# Patient Record
Sex: Female | Born: 1965 | Race: Black or African American | Hispanic: No | Marital: Single | Smoking: Never smoker
Health system: Southern US, Community
[De-identification: ages and names within clinical notes are randomized; demographics above are authoritative.]

## PROBLEM LIST (undated history)

## (undated) DIAGNOSIS — F329 Major depressive disorder, single episode, unspecified: Secondary | ICD-10-CM

## (undated) DIAGNOSIS — E119 Type 2 diabetes mellitus without complications: Secondary | ICD-10-CM

## (undated) DIAGNOSIS — G4730 Sleep apnea, unspecified: Secondary | ICD-10-CM

## (undated) DIAGNOSIS — M79606 Pain in leg, unspecified: Secondary | ICD-10-CM

## (undated) DIAGNOSIS — F419 Anxiety disorder, unspecified: Secondary | ICD-10-CM

## (undated) DIAGNOSIS — E785 Hyperlipidemia, unspecified: Secondary | ICD-10-CM

## (undated) DIAGNOSIS — I10 Essential (primary) hypertension: Secondary | ICD-10-CM

## (undated) DIAGNOSIS — F32A Depression, unspecified: Secondary | ICD-10-CM

## (undated) DIAGNOSIS — J45909 Unspecified asthma, uncomplicated: Secondary | ICD-10-CM

## (undated) DIAGNOSIS — N289 Disorder of kidney and ureter, unspecified: Secondary | ICD-10-CM

## (undated) HISTORY — DX: Essential (primary) hypertension: I10

## (undated) HISTORY — DX: Type 2 diabetes mellitus without complications: E11.9

## (undated) HISTORY — PX: GASTRIC BYPASS: SHX52

## (undated) HISTORY — PX: LAPAROSCOPIC CHOLECYSTECTOMY: SUR755

## (undated) HISTORY — PX: CARDIAC SURGERY: SHX584

## (undated) HISTORY — DX: Unspecified asthma, uncomplicated: J45.909

## (undated) HISTORY — DX: Major depressive disorder, single episode, unspecified: F32.9

## (undated) HISTORY — DX: Hyperlipidemia, unspecified: E78.5

## (undated) HISTORY — DX: Pain in leg, unspecified: M79.606

## (undated) HISTORY — DX: Sleep apnea, unspecified: G47.30

## (undated) HISTORY — DX: Morbid (severe) obesity due to excess calories: E66.01

## (undated) HISTORY — DX: Depression, unspecified: F32.A

---

## 2000-10-30 ENCOUNTER — Emergency Department (HOSPITAL_COMMUNITY): Admission: EM | Admit: 2000-10-30 | Discharge: 2000-10-30 | Payer: Self-pay | Admitting: Emergency Medicine

## 2003-02-23 ENCOUNTER — Other Ambulatory Visit: Payer: Self-pay

## 2003-06-28 ENCOUNTER — Other Ambulatory Visit: Payer: Self-pay

## 2004-06-09 ENCOUNTER — Emergency Department: Payer: Self-pay | Admitting: Emergency Medicine

## 2004-08-26 ENCOUNTER — Emergency Department: Payer: Self-pay | Admitting: Emergency Medicine

## 2004-09-01 ENCOUNTER — Ambulatory Visit: Payer: Self-pay | Admitting: Emergency Medicine

## 2004-11-12 ENCOUNTER — Ambulatory Visit: Payer: Self-pay | Admitting: Nurse Practitioner

## 2004-11-18 ENCOUNTER — Ambulatory Visit: Payer: Self-pay | Admitting: Nurse Practitioner

## 2005-05-01 ENCOUNTER — Emergency Department: Payer: Self-pay | Admitting: Emergency Medicine

## 2005-05-09 ENCOUNTER — Emergency Department: Payer: Self-pay | Admitting: Emergency Medicine

## 2005-09-28 ENCOUNTER — Emergency Department: Payer: Self-pay | Admitting: Internal Medicine

## 2005-09-28 ENCOUNTER — Other Ambulatory Visit: Payer: Self-pay

## 2006-01-31 ENCOUNTER — Emergency Department: Payer: Self-pay

## 2006-11-02 ENCOUNTER — Ambulatory Visit: Payer: Self-pay | Admitting: Nurse Practitioner

## 2006-11-22 ENCOUNTER — Ambulatory Visit: Payer: Self-pay | Admitting: Nurse Practitioner

## 2007-03-01 ENCOUNTER — Other Ambulatory Visit: Payer: Self-pay

## 2007-03-01 ENCOUNTER — Inpatient Hospital Stay: Payer: Self-pay | Admitting: Internal Medicine

## 2007-03-11 ENCOUNTER — Other Ambulatory Visit: Payer: Self-pay

## 2007-03-11 ENCOUNTER — Observation Stay: Payer: Self-pay | Admitting: Internal Medicine

## 2007-03-12 ENCOUNTER — Other Ambulatory Visit: Payer: Self-pay

## 2007-12-21 ENCOUNTER — Observation Stay: Payer: Self-pay | Admitting: Internal Medicine

## 2007-12-21 ENCOUNTER — Other Ambulatory Visit: Payer: Self-pay

## 2008-02-07 ENCOUNTER — Ambulatory Visit: Payer: Self-pay | Admitting: Family Medicine

## 2008-02-19 ENCOUNTER — Emergency Department: Payer: Self-pay | Admitting: Emergency Medicine

## 2008-11-15 ENCOUNTER — Emergency Department: Payer: Self-pay | Admitting: Emergency Medicine

## 2009-01-21 ENCOUNTER — Ambulatory Visit: Payer: Self-pay | Admitting: Family Medicine

## 2009-02-02 ENCOUNTER — Ambulatory Visit: Payer: Self-pay | Admitting: Family Medicine

## 2009-07-13 ENCOUNTER — Ambulatory Visit: Payer: Self-pay | Admitting: Internal Medicine

## 2009-07-13 ENCOUNTER — Inpatient Hospital Stay: Payer: Self-pay | Admitting: Internal Medicine

## 2009-07-14 ENCOUNTER — Encounter: Payer: Self-pay | Admitting: Cardiovascular Disease

## 2009-07-15 ENCOUNTER — Ambulatory Visit: Payer: Self-pay | Admitting: Cardiology

## 2010-05-04 NOTE — Consult Note (Signed)
[facility]   Imported By: Harlon Flor 07/16/2009 12:25:35  _____________________________________________________________________  External Attachment:    Type:   Image     Comment:   External Document

## 2011-04-28 ENCOUNTER — Ambulatory Visit: Payer: Self-pay | Admitting: Specialist

## 2011-04-28 LAB — COMPREHENSIVE METABOLIC PANEL
Albumin: 3.8 g/dL (ref 3.4–5.0)
Alkaline Phosphatase: 51 U/L (ref 50–136)
BUN: 9 mg/dL (ref 7–18)
Bilirubin,Total: 0.5 mg/dL (ref 0.2–1.0)
Calcium, Total: 9.1 mg/dL (ref 8.5–10.1)
Chloride: 103 mmol/L (ref 98–107)
Co2: 29 mmol/L (ref 21–32)
Creatinine: 0.91 mg/dL (ref 0.60–1.30)
Osmolality: 285 (ref 275–301)
SGOT(AST): 22 U/L (ref 15–37)
SGPT (ALT): 27 U/L
Sodium: 143 mmol/L (ref 136–145)

## 2011-04-28 LAB — PROTIME-INR: Prothrombin Time: 12.2 secs (ref 11.5–14.7)

## 2011-04-28 LAB — CBC WITH DIFFERENTIAL/PLATELET
Eosinophil %: 0.9 %
HGB: 13.4 g/dL (ref 12.0–16.0)
Lymphocyte %: 13.5 %
Monocyte #: 0.5 10*3/uL (ref 0.0–0.7)
Monocyte %: 4.5 %
Neutrophil #: 9.7 10*3/uL — ABNORMAL HIGH (ref 1.4–6.5)
Neutrophil %: 80.6 %
Platelet: 307 10*3/uL (ref 150–440)
RBC: 4.62 10*6/uL (ref 3.80–5.20)
WBC: 12 10*3/uL — ABNORMAL HIGH (ref 3.6–11.0)

## 2011-04-28 LAB — BILIRUBIN, DIRECT: Bilirubin, Direct: 0.1 mg/dL (ref 0.00–0.20)

## 2011-04-28 LAB — TSH: Thyroid Stimulating Horm: 4.84 u[IU]/mL — ABNORMAL HIGH

## 2011-04-28 LAB — FERRITIN: Ferritin (ARMC): 41 ng/mL (ref 8–388)

## 2011-04-28 LAB — AMYLASE: Amylase: 35 U/L (ref 25–115)

## 2011-04-28 LAB — PHOSPHORUS: Phosphorus: 3.9 mg/dL (ref 2.5–4.9)

## 2011-04-28 LAB — APTT: Activated PTT: 33.4 secs (ref 23.6–35.9)

## 2011-05-11 ENCOUNTER — Ambulatory Visit: Payer: Self-pay | Admitting: Specialist

## 2011-05-19 ENCOUNTER — Ambulatory Visit: Payer: Self-pay | Admitting: Cardiovascular Disease

## 2011-06-03 ENCOUNTER — Ambulatory Visit: Payer: Self-pay | Admitting: Specialist

## 2011-06-30 ENCOUNTER — Ambulatory Visit: Payer: Self-pay | Admitting: Gastroenterology

## 2011-07-08 ENCOUNTER — Ambulatory Visit: Payer: Self-pay | Admitting: Specialist

## 2011-08-02 ENCOUNTER — Other Ambulatory Visit: Payer: Self-pay | Admitting: Specialist

## 2011-08-02 LAB — CBC WITH DIFFERENTIAL/PLATELET
Basophil #: 0.1 10*3/uL (ref 0.0–0.1)
Basophil %: 0.6 %
Eosinophil %: 1.9 %
HCT: 38.3 % (ref 35.0–47.0)
HGB: 11.7 g/dL — ABNORMAL LOW (ref 12.0–16.0)
Lymphocyte #: 1.9 10*3/uL (ref 1.0–3.6)
Lymphocyte %: 14 %
MCH: 27.4 pg (ref 26.0–34.0)
MCV: 90 fL (ref 80–100)
Monocyte #: 0.6 x10 3/mm (ref 0.2–0.9)
Monocyte %: 4.6 %
Neutrophil %: 78.9 %

## 2011-08-02 LAB — COMPREHENSIVE METABOLIC PANEL
Albumin: 3.3 g/dL — ABNORMAL LOW (ref 3.4–5.0)
Anion Gap: 8 (ref 7–16)
BUN: 11 mg/dL (ref 7–18)
Calcium, Total: 8.8 mg/dL (ref 8.5–10.1)
Chloride: 102 mmol/L (ref 98–107)
Co2: 28 mmol/L (ref 21–32)
Creatinine: 1.1 mg/dL (ref 0.60–1.30)
EGFR (African American): >60
EGFR (Non-African Amer.): >60
Glucose: 239 mg/dL — ABNORMAL HIGH (ref 65–99)
Osmolality: 283 (ref 275–301)
Potassium: 4 mmol/L (ref 3.5–5.1)
SGOT(AST): 17 U/L (ref 15–37)
SGPT (ALT): 22 U/L
Sodium: 138 mmol/L (ref 136–145)
Total Protein: 7.4 g/dL (ref 6.4–8.2)

## 2011-08-03 ENCOUNTER — Ambulatory Visit: Payer: Self-pay | Admitting: Specialist

## 2011-08-10 ENCOUNTER — Ambulatory Visit: Payer: Self-pay | Admitting: Specialist

## 2011-09-03 ENCOUNTER — Ambulatory Visit: Payer: Self-pay | Admitting: Specialist

## 2011-10-05 ENCOUNTER — Other Ambulatory Visit: Payer: Self-pay | Admitting: Specialist

## 2012-01-04 ENCOUNTER — Ambulatory Visit: Payer: Self-pay | Admitting: Specialist

## 2012-02-03 ENCOUNTER — Ambulatory Visit: Payer: Self-pay | Admitting: Specialist

## 2013-01-02 ENCOUNTER — Encounter: Payer: Self-pay | Admitting: Internal Medicine

## 2013-01-21 ENCOUNTER — Ambulatory Visit: Payer: Self-pay | Admitting: Family Medicine

## 2014-04-14 ENCOUNTER — Ambulatory Visit: Payer: Self-pay

## 2014-05-07 ENCOUNTER — Emergency Department: Payer: Self-pay | Admitting: Student

## 2014-05-08 ENCOUNTER — Emergency Department: Payer: Self-pay | Admitting: Emergency Medicine

## 2014-05-26 ENCOUNTER — Ambulatory Visit: Payer: Self-pay | Admitting: Cardiovascular Disease

## 2014-05-27 ENCOUNTER — Ambulatory Visit: Payer: Self-pay | Admitting: Pain Medicine

## 2014-05-30 ENCOUNTER — Encounter: Payer: Self-pay | Admitting: Cardiovascular Disease

## 2014-05-30 ENCOUNTER — Ambulatory Visit (INDEPENDENT_AMBULATORY_CARE_PROVIDER_SITE_OTHER): Payer: Medicare Other | Admitting: Cardiovascular Disease

## 2014-05-30 ENCOUNTER — Encounter: Payer: Self-pay | Admitting: *Deleted

## 2014-05-30 ENCOUNTER — Ambulatory Visit: Payer: Self-pay | Admitting: Cardiovascular Disease

## 2014-05-30 ENCOUNTER — Other Ambulatory Visit: Payer: Self-pay

## 2014-05-30 ENCOUNTER — Encounter (INDEPENDENT_AMBULATORY_CARE_PROVIDER_SITE_OTHER): Payer: Self-pay

## 2014-05-30 VITALS — BP 122/98 | HR 102 | Ht 67.0 in | Wt 366.5 lb

## 2014-05-30 DIAGNOSIS — R079 Chest pain, unspecified: Secondary | ICD-10-CM

## 2014-05-30 DIAGNOSIS — E785 Hyperlipidemia, unspecified: Secondary | ICD-10-CM | POA: Insufficient documentation

## 2014-05-30 DIAGNOSIS — I10 Essential (primary) hypertension: Secondary | ICD-10-CM | POA: Insufficient documentation

## 2014-05-30 NOTE — Progress Notes (Signed)
Primary care physician: Dr. Sherald BargeBecker-Dreps. [facility]  HPI  This is a 49 year old African-American female who was referred for evaluation of chest pain. She has chronic episodes of chest pain. She has known history of morbid obesity status post gastric bypass surgery in 2014. She lost 160 pounds since then. She also has known history of hypertension and type 2 diabetes which improved after weight loss. She is status post cholecystectomy. I have seen her in the past many years ago for chest pain. No ischemic workup could be done at that time due to morbid obesity. [facility]. The chest pain was left-sided and worse with certain movements. It was felt to be musculoskeletal. She takes a muscle relaxant with improvement in symptoms. However, in addition to that, she describes substernal chest tightness with shortness of breath which mainly happens with physical exertion. This happened today after walking from the parking lot to our office.  No Known Allergies   No current outpatient prescriptions on file prior to visit.   No current facility-administered medications on file prior to visit.     Past Medical History  Diagnosis Date  . Diabetes   . Sleep apnea   . Leg pain   . Asthma   . Depression   . Morbid obesity   . Essential hypertension   . Hyperlipidemia      Past Surgical History  Procedure Laterality Date  . Gastric bypass    . Laparoscopic cholecystectomy       Family History  Problem Relation Age of Onset  . Heart failure Mother      History   Social History  . Marital Status: Single    Spouse Name: N/A  . Number of Children: N/A  . Years of Education: N/A   Occupational History  . Not on file.   Social History Main Topics  . Smoking status: Never Smoker   . Smokeless tobacco: Not on file  . Alcohol Use: No  . Drug Use: No  . Sexual Activity: Not on file   Other Topics Concern  . Not  on file   Social History Narrative     ROS A 10 point review of system was performed. It is negative other than that mentioned in the history of present illness.   PHYSICAL EXAM   BP 122/98 mmHg  Pulse 102  Ht 5\' 7"  (1.702 m)  Wt 366 lb 8 oz (166.243 kg)  BMI 57.39 kg/m2  Constitutional: She is oriented to person, place, and time. She appears well-developed and well-nourished. No distress.  HENT: No nasal discharge.  Head: Normocephalic and atraumatic.  Eyes: Pupils are equal and round. No discharge.  Neck: Normal range of motion. Neck supple. No JVD present. No thyromegaly present.  Cardiovascular: Normal rate, regular rhythm, normal heart sounds. Exam reveals no gallop and no friction rub. No murmur heard.  Pulmonary/Chest: Effort normal and breath sounds normal. No stridor. No respiratory distress. She has no wheezes. She has no rales. She exhibits no tenderness.  Abdominal: Soft. Bowel sounds are normal. She exhibits no distension. There is no tenderness. There is no rebound and no guarding.  Musculoskeletal: Normal range of motion. She exhibits no edema and no tenderness.  Neurological: She is alert and oriented to person, place, and time. Coordination normal.  Skin: Skin is warm and dry. No rash noted. She is not diaphoretic. No erythema. No pallor.  Psychiatric: She has a  normal mood and affect. Her behavior is normal. Judgment and thought content normal.    ONG:EXBMW  Tachycardia  -consider old anterior infarct.   ABNORMAL     ASSESSMENT AND PLAN

## 2014-05-30 NOTE — Patient Instructions (Signed)
[facility] Cardiac Cath Instructions   You are scheduled for a Cardiac Cath on:___3/4/16______________________  Please arrive at _0630______am on the day of your procedure  You will need to pre-register prior to the day of your procedure.  [facility].  Registration is the first desk on your right.  Please take the procedure order we have given you in order to be registered appropriately  Do not eat/drink anything after midnight  Someone will need to drive you home  It is recommended someone be with you for the first 24 hours after your procedure  Wear clothes that are easy to get on/off and wear slip on shoes if possible   Medications bring a current list of all medications with you   _x__ Do not take these medications before your procedure: Hold Metformin a day before and two days after   Day of your procedure: [facility].  Free valet service is available.  After entering the Medical Mall please check-in at the registration desk (1st desk on your right) to receive your armband. After receiving your armband someone will escort you to the cardiac cath/special procedures waiting area.  The usual length of stay after your procedure is about 2 to 3 hours.  This can vary.  If you have any questions, please call our office at [phone], or you may call the cardiac cath lab at [facility] directly at [phone]   Your physician recommends that you return for lab work in:  Please take lab and chest x ray orders to [facility] medical mall entrance. To the Registration desk.  BMP INR CBC

## 2014-06-01 ENCOUNTER — Encounter: Payer: Self-pay | Admitting: Cardiovascular Disease

## 2014-06-01 DIAGNOSIS — R079 Chest pain, unspecified: Secondary | ICD-10-CM | POA: Insufficient documentation

## 2014-06-01 NOTE — Assessment & Plan Note (Signed)
Blood pressure is controlled on current medications. Given resting sinus tachycardia, a small dose beta blocker can be considered.

## 2014-06-01 NOTE — Assessment & Plan Note (Signed)
The patient clearly has a musculoskeletal component to her chest pain. However, she has a different kind of symptoms as well including substernal chest tightness with dyspnea. These mainly happening with physical exertion and currently with minimal activities. The symptoms are suggestive of class III angina. She has multiple risk factors for coronary artery disease. Unfortunately, stress testing would have somewhat of reduced diagnostic accuracy due to her weight and high chance of artifacts. I believe the best option is probably cardiac catheterization via the radial artery in order to minimize risk of bleeding. I discussed risks and benefits as well as alternatives. I scheduled the procedure for next week.

## 2014-06-05 ENCOUNTER — Telehealth: Payer: Self-pay | Admitting: *Deleted

## 2014-06-05 NOTE — Telephone Encounter (Signed)
Cath orders faxed to specials  Angie confirmed receipt

## 2014-06-06 ENCOUNTER — Ambulatory Visit: Payer: Self-pay | Admitting: Cardiovascular Disease

## 2014-06-06 DIAGNOSIS — R079 Chest pain, unspecified: Secondary | ICD-10-CM

## 2014-06-09 ENCOUNTER — Ambulatory Visit: Payer: Self-pay | Admitting: Pain Medicine

## 2014-06-10 ENCOUNTER — Ambulatory Visit: Payer: Self-pay | Admitting: Pain Medicine

## 2014-06-11 ENCOUNTER — Other Ambulatory Visit: Payer: Self-pay | Admitting: Neurosurgery

## 2014-06-13 ENCOUNTER — Encounter (HOSPITAL_COMMUNITY): Payer: Self-pay | Admitting: *Deleted

## 2014-06-13 NOTE — Progress Notes (Signed)
Patient had a cardiac cath this year, states it was normal.  I faxed a request to [facility] for results.

## 2014-06-15 MED ORDER — DEXTROSE 5 % IV SOLN
3.0000 g | INTRAVENOUS | Status: AC
  Administered 2014-06-16: 3 g via INTRAVENOUS
  Filled 2014-06-15 (×2): qty 3000

## 2014-06-15 MED ORDER — DEXAMETHASONE SODIUM PHOSPHATE 10 MG/ML IJ SOLN
10.0000 mg | INTRAMUSCULAR | Status: AC
  Administered 2014-06-16: 10 mg via INTRAVENOUS
  Filled 2014-06-15: qty 1

## 2014-06-16 ENCOUNTER — Encounter (HOSPITAL_COMMUNITY): Payer: Self-pay | Admitting: Certified Registered"

## 2014-06-16 ENCOUNTER — Inpatient Hospital Stay (HOSPITAL_COMMUNITY): Payer: Medicare Other

## 2014-06-16 ENCOUNTER — Inpatient Hospital Stay (HOSPITAL_COMMUNITY): Payer: Medicare Other | Admitting: Certified Registered"

## 2014-06-16 ENCOUNTER — Encounter (HOSPITAL_COMMUNITY)
Admission: RE | Disposition: A | Discharge status: Home / Self Care | Payer: Self-pay | Source: Ambulatory Visit | Attending: Neurosurgery

## 2014-06-16 ENCOUNTER — Inpatient Hospital Stay (HOSPITAL_COMMUNITY)
Admission: RE | Admit: 2014-06-16 | Discharge: 2014-06-17 | DRG: 982 | Disposition: A | Discharge status: Home / Self Care | Payer: Medicare Other | Source: Ambulatory Visit | Attending: Neurosurgery | Admitting: Neurosurgery

## 2014-06-16 DIAGNOSIS — Z6841 Body Mass Index (BMI) 40.0 and over, adult: Secondary | ICD-10-CM

## 2014-06-16 DIAGNOSIS — Z9884 Bariatric surgery status: Secondary | ICD-10-CM

## 2014-06-16 DIAGNOSIS — E785 Hyperlipidemia, unspecified: Secondary | ICD-10-CM | POA: Diagnosis present

## 2014-06-16 DIAGNOSIS — M542 Cervicalgia: Secondary | ICD-10-CM | POA: Diagnosis present

## 2014-06-16 DIAGNOSIS — J45909 Unspecified asthma, uncomplicated: Secondary | ICD-10-CM | POA: Diagnosis present

## 2014-06-16 DIAGNOSIS — E119 Type 2 diabetes mellitus without complications: Secondary | ICD-10-CM | POA: Diagnosis present

## 2014-06-16 DIAGNOSIS — F419 Anxiety disorder, unspecified: Secondary | ICD-10-CM | POA: Diagnosis present

## 2014-06-16 DIAGNOSIS — F329 Major depressive disorder, single episode, unspecified: Secondary | ICD-10-CM | POA: Diagnosis present

## 2014-06-16 DIAGNOSIS — M5000 Cervical disc disorder with myelopathy, unspecified cervical region: Secondary | ICD-10-CM | POA: Diagnosis present

## 2014-06-16 DIAGNOSIS — I10 Essential (primary) hypertension: Secondary | ICD-10-CM | POA: Diagnosis present

## 2014-06-16 DIAGNOSIS — G4730 Sleep apnea, unspecified: Secondary | ICD-10-CM | POA: Diagnosis present

## 2014-06-16 DIAGNOSIS — M5002 Cervical disc disorder with myelopathy, mid-cervical region: Secondary | ICD-10-CM | POA: Diagnosis present

## 2014-06-16 DIAGNOSIS — Z419 Encounter for procedure for purposes other than remedying health state, unspecified: Secondary | ICD-10-CM

## 2014-06-16 DIAGNOSIS — M5020 Other cervical disc displacement, unspecified cervical region: Secondary | ICD-10-CM | POA: Diagnosis present

## 2014-06-16 HISTORY — DX: Anxiety disorder, unspecified: F41.9

## 2014-06-16 HISTORY — PX: ANTERIOR CERVICAL DECOMP/DISCECTOMY FUSION: SHX1161

## 2014-06-16 LAB — CBC WITH DIFFERENTIAL/PLATELET
BASOS ABS: 0 10*3/uL (ref 0.0–0.1)
Basophils Relative: 0 % (ref 0–1)
Eosinophils Absolute: 0.2 10*3/uL (ref 0.0–0.7)
Eosinophils Relative: 2 % (ref 0–5)
HCT: 37.8 % (ref 36.0–46.0)
Hemoglobin: 12.2 g/dL (ref 12.0–15.0)
LYMPHS PCT: 15 % (ref 12–46)
Lymphs Abs: 1.9 10*3/uL (ref 0.7–4.0)
MCH: 28.6 pg (ref 26.0–34.0)
MCHC: 32.3 g/dL (ref 30.0–36.0)
MCV: 88.7 fL (ref 78.0–100.0)
MONO ABS: 0.8 10*3/uL (ref 0.1–1.0)
Monocytes Relative: 7 % (ref 3–12)
NEUTROS ABS: 9.7 10*3/uL — AB (ref 1.7–7.7)
NEUTROS PCT: 76 % (ref 43–77)
PLATELETS: 360 10*3/uL (ref 150–400)
RBC: 4.26 MIL/uL (ref 3.87–5.11)
RDW: 13.4 % (ref 11.5–15.5)
WBC: 12.7 10*3/uL — AB (ref 4.0–10.5)

## 2014-06-16 LAB — GLUCOSE, CAPILLARY
GLUCOSE-CAPILLARY: 153 mg/dL — AB (ref 70–99)
GLUCOSE-CAPILLARY: 274 mg/dL — AB (ref 70–99)
Glucose-Capillary: 121 mg/dL — ABNORMAL HIGH (ref 70–99)
Glucose-Capillary: 157 mg/dL — ABNORMAL HIGH (ref 70–99)

## 2014-06-16 LAB — BASIC METABOLIC PANEL
ANION GAP: 8 (ref 5–15)
BUN: 12 mg/dL (ref 6–23)
CALCIUM: 8.8 mg/dL (ref 8.4–10.5)
CHLORIDE: 103 mmol/L (ref 96–112)
CO2: 26 mmol/L (ref 19–32)
Creatinine, Ser: 0.9 mg/dL (ref 0.50–1.10)
GFR calc Af Amer: 88 mL/min — ABNORMAL LOW (ref >90)
GFR calc non Af Amer: 76 mL/min — ABNORMAL LOW (ref >90)
Glucose, Bld: 121 mg/dL — ABNORMAL HIGH (ref 70–99)
POTASSIUM: 4.3 mmol/L (ref 3.5–5.1)
SODIUM: 137 mmol/L (ref 135–145)

## 2014-06-16 LAB — HCG, SERUM, QUALITATIVE: Preg, Serum: NEGATIVE

## 2014-06-16 LAB — SURGICAL PCR SCREEN
MRSA, PCR: NEGATIVE
Staphylococcus aureus: POSITIVE — AB

## 2014-06-16 SURGERY — ANTERIOR CERVICAL DECOMPRESSION/DISCECTOMY FUSION 1 LEVEL
Anesthesia: General

## 2014-06-16 MED ORDER — HYDROMORPHONE HCL 1 MG/ML IJ SOLN
0.5000 mg | INTRAMUSCULAR | Status: DC | PRN
  Administered 2014-06-16 – 2014-06-17 (×3): 1 mg via INTRAVENOUS
  Filled 2014-06-16 (×2): qty 1

## 2014-06-16 MED ORDER — FLUTICASONE PROPIONATE HFA 44 MCG/ACT IN AERO
1.0000 | INHALATION_SPRAY | Freq: Two times a day (BID) | RESPIRATORY_TRACT | Status: DC
  Administered 2014-06-16 – 2014-06-17 (×2): 1 via RESPIRATORY_TRACT
  Filled 2014-06-16: qty 10.6

## 2014-06-16 MED ORDER — CYCLOBENZAPRINE HCL 10 MG PO TABS
10.0000 mg | ORAL_TABLET | Freq: Three times a day (TID) | ORAL | Status: DC | PRN
  Administered 2014-06-16: 10 mg via ORAL

## 2014-06-16 MED ORDER — MENTHOL 3 MG MT LOZG: 1.0000 | LOZENGE | OROMUCOSAL | Status: DC | PRN

## 2014-06-16 MED ORDER — HYDROMORPHONE HCL 1 MG/ML IJ SOLN
INTRAMUSCULAR | Status: AC
  Filled 2014-06-16: qty 1

## 2014-06-16 MED ORDER — OXYCODONE-ACETAMINOPHEN 5-325 MG PO TABS
ORAL_TABLET | ORAL | Status: AC
  Filled 2014-06-16: qty 2

## 2014-06-16 MED ORDER — THROMBIN 5000 UNITS EX SOLR
CUTANEOUS | Status: DC | PRN
  Administered 2014-06-16 (×2): 5000 [IU] via TOPICAL

## 2014-06-16 MED ORDER — ONDANSETRON HCL 4 MG/2ML IJ SOLN: 4.0000 mg | Freq: Once | INTRAMUSCULAR | Status: DC | PRN

## 2014-06-16 MED ORDER — LIDOCAINE HCL (CARDIAC) 20 MG/ML IV SOLN
INTRAVENOUS | Status: AC
  Filled 2014-06-16: qty 5

## 2014-06-16 MED ORDER — OXYCODONE-ACETAMINOPHEN 5-325 MG PO TABS
1.0000 | ORAL_TABLET | ORAL | Status: DC | PRN
  Administered 2014-06-16 – 2014-06-17 (×3): 2 via ORAL
  Filled 2014-06-16 (×2): qty 2

## 2014-06-16 MED ORDER — ROCURONIUM BROMIDE 100 MG/10ML IV SOLN
INTRAVENOUS | Status: DC | PRN
  Administered 2014-06-16: 40 mg via INTRAVENOUS

## 2014-06-16 MED ORDER — HYDROCODONE-ACETAMINOPHEN 5-325 MG PO TABS: 1.0000 | ORAL_TABLET | ORAL | Status: DC | PRN

## 2014-06-16 MED ORDER — SODIUM CHLORIDE 0.9 % IJ SOLN
3.0000 mL | Freq: Two times a day (BID) | INTRAMUSCULAR | Status: DC
  Administered 2014-06-16: 3 mL via INTRAVENOUS

## 2014-06-16 MED ORDER — ALPRAZOLAM 0.5 MG PO TABS: 2.0000 mg | ORAL_TABLET | Freq: Three times a day (TID) | ORAL | Status: DC | PRN

## 2014-06-16 MED ORDER — SODIUM CHLORIDE 0.9 % IV SOLN: 250.0000 mL | INTRAVENOUS | Status: DC

## 2014-06-16 MED ORDER — OXYCODONE HCL 5 MG PO TABS
5.0000 mg | ORAL_TABLET | Freq: Once | ORAL | Status: AC | PRN
  Administered 2014-06-16: 5 mg via ORAL

## 2014-06-16 MED ORDER — OXYCODONE HCL 5 MG/5ML PO SOLN: 5.0000 mg | Freq: Once | ORAL | Status: AC | PRN

## 2014-06-16 MED ORDER — LACTATED RINGERS IV SOLN
INTRAVENOUS | Status: DC
  Administered 2014-06-16 (×2): via INTRAVENOUS

## 2014-06-16 MED ORDER — SUCCINYLCHOLINE CHLORIDE 20 MG/ML IJ SOLN
INTRAMUSCULAR | Status: DC | PRN
  Administered 2014-06-16: 100 mg via INTRAVENOUS

## 2014-06-16 MED ORDER — MIDAZOLAM HCL 2 MG/2ML IJ SOLN
INTRAMUSCULAR | Status: AC
  Filled 2014-06-16: qty 2

## 2014-06-16 MED ORDER — MUPIROCIN 2 % EX OINT
TOPICAL_OINTMENT | CUTANEOUS | Status: AC
  Administered 2014-06-16: 1 via TOPICAL
  Filled 2014-06-16: qty 22

## 2014-06-16 MED ORDER — ZIPRASIDONE HCL 80 MG PO CAPS
160.0000 mg | ORAL_CAPSULE | Freq: Every day | ORAL | Status: DC
  Administered 2014-06-16: 160 mg via ORAL
  Filled 2014-06-16 (×2): qty 2

## 2014-06-16 MED ORDER — ACETAMINOPHEN 650 MG RE SUPP: 650.0000 mg | RECTAL | Status: DC | PRN

## 2014-06-16 MED ORDER — PROPOFOL 10 MG/ML IV BOLUS
INTRAVENOUS | Status: AC
  Filled 2014-06-16: qty 20

## 2014-06-16 MED ORDER — CYCLOBENZAPRINE HCL 10 MG PO TABS
ORAL_TABLET | ORAL | Status: AC
  Filled 2014-06-16: qty 1

## 2014-06-16 MED ORDER — NEOSTIGMINE METHYLSULFATE 10 MG/10ML IV SOLN
INTRAVENOUS | Status: AC
  Filled 2014-06-16: qty 1

## 2014-06-16 MED ORDER — ONDANSETRON HCL 4 MG/2ML IJ SOLN
INTRAMUSCULAR | Status: AC
  Filled 2014-06-16: qty 2

## 2014-06-16 MED ORDER — CLONAZEPAM 1 MG PO TABS: 1.0000 mg | ORAL_TABLET | Freq: Three times a day (TID) | ORAL | Status: DC | PRN

## 2014-06-16 MED ORDER — MEGESTROL ACETATE 40 MG PO TABS
40.0000 mg | ORAL_TABLET | Freq: Every day | ORAL | Status: DC
  Administered 2014-06-17: 40 mg via ORAL
  Filled 2014-06-16: qty 1

## 2014-06-16 MED ORDER — CLONAZEPAM 1 MG PO TABS
2.0000 mg | ORAL_TABLET | Freq: Every day | ORAL | Status: DC
  Administered 2014-06-16: 2 mg via ORAL
  Filled 2014-06-16: qty 2

## 2014-06-16 MED ORDER — NEOSTIGMINE METHYLSULFATE 10 MG/10ML IV SOLN
INTRAVENOUS | Status: DC | PRN
  Administered 2014-06-16: 3 mg via INTRAVENOUS

## 2014-06-16 MED ORDER — ROCURONIUM BROMIDE 50 MG/5ML IV SOLN
INTRAVENOUS | Status: AC
  Filled 2014-06-16: qty 1

## 2014-06-16 MED ORDER — GLYCOPYRROLATE 0.2 MG/ML IJ SOLN
INTRAMUSCULAR | Status: DC | PRN
  Administered 2014-06-16: 0.4 mg via INTRAVENOUS

## 2014-06-16 MED ORDER — DEXAMETHASONE SODIUM PHOSPHATE 10 MG/ML IJ SOLN: INTRAMUSCULAR | Status: DC | PRN

## 2014-06-16 MED ORDER — FENTANYL CITRATE 0.05 MG/ML IJ SOLN
INTRAMUSCULAR | Status: AC
  Filled 2014-06-16: qty 5

## 2014-06-16 MED ORDER — LIDOCAINE HCL (CARDIAC) 20 MG/ML IV SOLN
INTRAVENOUS | Status: DC | PRN
  Administered 2014-06-16: 30 mg via INTRAVENOUS

## 2014-06-16 MED ORDER — GLYCOPYRROLATE 0.2 MG/ML IJ SOLN
INTRAMUSCULAR | Status: AC
  Filled 2014-06-16: qty 2

## 2014-06-16 MED ORDER — ALBUTEROL SULFATE (2.5 MG/3ML) 0.083% IN NEBU: 2.5000 mg | INHALATION_SOLUTION | Freq: Four times a day (QID) | RESPIRATORY_TRACT | Status: DC | PRN

## 2014-06-16 MED ORDER — MIDAZOLAM HCL 5 MG/5ML IJ SOLN
INTRAMUSCULAR | Status: DC | PRN
  Administered 2014-06-16: 2 mg via INTRAVENOUS

## 2014-06-16 MED ORDER — VENLAFAXINE HCL 37.5 MG PO TABS
75.0000 mg | ORAL_TABLET | Freq: Two times a day (BID) | ORAL | Status: DC
  Administered 2014-06-17: 75 mg via ORAL
  Filled 2014-06-16: qty 2

## 2014-06-16 MED ORDER — FENTANYL CITRATE 0.05 MG/ML IJ SOLN
INTRAMUSCULAR | Status: DC | PRN
  Administered 2014-06-16: 100 ug via INTRAVENOUS
  Administered 2014-06-16 (×2): 50 ug via INTRAVENOUS

## 2014-06-16 MED ORDER — ONDANSETRON HCL 4 MG/2ML IJ SOLN
INTRAMUSCULAR | Status: DC | PRN
Start: 2014-06-16 — End: 2014-06-16
  Administered 2014-06-16: 4 mg via INTRAVENOUS

## 2014-06-16 MED ORDER — PROPOFOL 10 MG/ML IV BOLUS
INTRAVENOUS | Status: DC | PRN
  Administered 2014-06-16: 190 mg via INTRAVENOUS

## 2014-06-16 MED ORDER — OXYCODONE HCL 5 MG PO TABS
ORAL_TABLET | ORAL | Status: AC
  Filled 2014-06-16: qty 1

## 2014-06-16 MED ORDER — CEFAZOLIN SODIUM 1-5 GM-% IV SOLN
1.0000 g | Freq: Three times a day (TID) | INTRAVENOUS | Status: AC
  Administered 2014-06-16 – 2014-06-17 (×2): 1 g via INTRAVENOUS
  Filled 2014-06-16 (×2): qty 50

## 2014-06-16 MED ORDER — PHENOL 1.4 % MT LIQD: 1.0000 | OROMUCOSAL | Status: DC | PRN

## 2014-06-16 MED ORDER — HEMOSTATIC AGENTS (NO CHARGE) OPTIME
TOPICAL | Status: DC | PRN
  Administered 2014-06-16: 1 via TOPICAL

## 2014-06-16 MED ORDER — LISINOPRIL 2.5 MG PO TABS
2.5000 mg | ORAL_TABLET | Freq: Every day | ORAL | Status: DC
  Administered 2014-06-16 – 2014-06-17 (×2): 2.5 mg via ORAL
  Filled 2014-06-16 (×2): qty 1

## 2014-06-16 MED ORDER — HYDROMORPHONE HCL 1 MG/ML IJ SOLN
0.2500 mg | INTRAMUSCULAR | Status: DC | PRN
  Administered 2014-06-16 (×4): 0.5 mg via INTRAVENOUS

## 2014-06-16 MED ORDER — METFORMIN HCL 500 MG PO TABS
500.0000 mg | ORAL_TABLET | Freq: Two times a day (BID) | ORAL | Status: DC
  Administered 2014-06-17: 500 mg via ORAL
  Filled 2014-06-16: qty 1

## 2014-06-16 MED ORDER — DOCUSATE SODIUM 100 MG PO CAPS
100.0000 mg | ORAL_CAPSULE | Freq: Two times a day (BID) | ORAL | Status: DC
  Administered 2014-06-16 – 2014-06-17 (×2): 100 mg via ORAL
  Filled 2014-06-16 (×2): qty 1

## 2014-06-16 MED ORDER — SODIUM CHLORIDE 0.9 % IJ SOLN: 3.0000 mL | INTRAMUSCULAR | Status: DC | PRN

## 2014-06-16 MED ORDER — 0.9 % SODIUM CHLORIDE (POUR BTL) OPTIME
TOPICAL | Status: DC | PRN
  Administered 2014-06-16: 1000 mL

## 2014-06-16 MED ORDER — ONDANSETRON HCL 4 MG/2ML IJ SOLN: 4.0000 mg | INTRAMUSCULAR | Status: DC | PRN

## 2014-06-16 MED ORDER — MONTELUKAST SODIUM 10 MG PO TABS: 10.0000 mg | ORAL_TABLET | Freq: Every day | ORAL | Status: DC

## 2014-06-16 MED ORDER — ACETAMINOPHEN 325 MG PO TABS: 650.0000 mg | ORAL_TABLET | ORAL | Status: DC | PRN

## 2014-06-16 MED ORDER — MUPIROCIN 2 % EX OINT
1.0000 "application " | TOPICAL_OINTMENT | Freq: Once | CUTANEOUS | Status: AC
  Administered 2014-06-16: 1 via TOPICAL

## 2014-06-16 SURGICAL SUPPLY — 59 items
APL SKNCLS STERI-STRIP NONHPOA (GAUZE/BANDAGES/DRESSINGS) ×1
BAG DECANTER FOR FLEXI CONT (MISCELLANEOUS) ×3 IMPLANT
BENZOIN TINCTURE PRP APPL 2/3 (GAUZE/BANDAGES/DRESSINGS) ×3 IMPLANT
BRUSH SCRUB EZ PLAIN DRY (MISCELLANEOUS) ×3 IMPLANT
BUR MATCHSTICK NEURO 3.0 LAGG (BURR) ×3 IMPLANT
CAGE PEEK 6X14X11 (Cage) ×3 IMPLANT
CAGE SPNL 11X14X6XRADOPQ (Cage) IMPLANT
CANISTER SUCT 3000ML PPV (MISCELLANEOUS) ×3 IMPLANT
CLOSURE WOUND 1/2 X4 (GAUZE/BANDAGES/DRESSINGS) ×1
CONT SPEC 4OZ CLIKSEAL STRL BL (MISCELLANEOUS) ×3 IMPLANT
DRAPE C-ARM 42X72 X-RAY (DRAPES) ×6 IMPLANT
DRAPE LAPAROTOMY 100X72 PEDS (DRAPES) ×3 IMPLANT
DRAPE MICROSCOPE LEICA (MISCELLANEOUS) ×3 IMPLANT
DRAPE POUCH INSTRU U-SHP 10X18 (DRAPES) ×3 IMPLANT
DRILL BIT (BIT) ×2 IMPLANT
DURAPREP 6ML APPLICATOR 50/CS (WOUND CARE) ×3 IMPLANT
ELECT COATED BLADE 2.86 ST (ELECTRODE) ×3 IMPLANT
ELECT REM PT RETURN 9FT ADLT (ELECTROSURGICAL) ×3
ELECTRODE REM PT RTRN 9FT ADLT (ELECTROSURGICAL) ×1 IMPLANT
GAUZE SPONGE 4X4 12PLY STRL (GAUZE/BANDAGES/DRESSINGS) ×3 IMPLANT
GAUZE SPONGE 4X4 16PLY XRAY LF (GAUZE/BANDAGES/DRESSINGS) IMPLANT
GLOVE BIO SURGEON STRL SZ8 (GLOVE) ×2 IMPLANT
GLOVE BIO SURGEON STRL SZ8.5 (GLOVE) ×2 IMPLANT
GLOVE ECLIPSE 7.5 STRL STRAW (GLOVE) ×2 IMPLANT
GLOVE ECLIPSE 9.0 STRL (GLOVE) ×3 IMPLANT
GLOVE EXAM NITRILE LRG STRL (GLOVE) IMPLANT
GLOVE EXAM NITRILE MD LF STRL (GLOVE) IMPLANT
GLOVE EXAM NITRILE XL STR (GLOVE) IMPLANT
GLOVE EXAM NITRILE XS STR PU (GLOVE) IMPLANT
GLOVE INDICATOR 7.5 STRL GRN (GLOVE) ×2 IMPLANT
GLOVE SURG SS PI 7.0 STRL IVOR (GLOVE) ×4 IMPLANT
GOWN STRL REUS W/ TWL LRG LVL3 (GOWN DISPOSABLE) IMPLANT
GOWN STRL REUS W/ TWL XL LVL3 (GOWN DISPOSABLE) IMPLANT
GOWN STRL REUS W/TWL 2XL LVL3 (GOWN DISPOSABLE) IMPLANT
GOWN STRL REUS W/TWL LRG LVL3 (GOWN DISPOSABLE)
GOWN STRL REUS W/TWL XL LVL3 (GOWN DISPOSABLE) ×12
HALTER HD/CHIN CERV TRACTION D (MISCELLANEOUS) ×3 IMPLANT
KIT BASIN OR (CUSTOM PROCEDURE TRAY) ×3 IMPLANT
KIT ROOM TURNOVER OR (KITS) ×3 IMPLANT
NDL SPNL 20GX3.5 QUINCKE YW (NEEDLE) ×1 IMPLANT
NEEDLE SPNL 20GX3.5 QUINCKE YW (NEEDLE) ×3 IMPLANT
NS IRRIG 1000ML POUR BTL (IV SOLUTION) ×3 IMPLANT
PACK LAMINECTOMY NEURO (CUSTOM PROCEDURE TRAY) ×3 IMPLANT
PAD ARMBOARD 7.5X6 YLW CONV (MISCELLANEOUS) ×9 IMPLANT
PLATE 23MM (Plate) ×2 IMPLANT
RUBBERBAND STERILE (MISCELLANEOUS) ×6 IMPLANT
SCREW ST 13X4XST VA NS SPNE (Screw) IMPLANT
SCREW ST VAR 4 ATL (Screw) ×12 IMPLANT
SPONGE INTESTINAL PEANUT (DISPOSABLE) ×3 IMPLANT
SPONGE SURGIFOAM ABS GEL SZ50 (HEMOSTASIS) ×3 IMPLANT
STRIP CLOSURE SKIN 1/2X4 (GAUZE/BANDAGES/DRESSINGS) ×2 IMPLANT
SUT PDS AB 5-0 P3 18 (SUTURE) ×3 IMPLANT
SUT VIC AB 3-0 SH 8-18 (SUTURE) ×3 IMPLANT
SYR 20ML ECCENTRIC (SYRINGE) ×3 IMPLANT
TAPE CLOTH 4X10 WHT NS (GAUZE/BANDAGES/DRESSINGS) ×3 IMPLANT
TOWEL OR 17X24 6PK STRL BLUE (TOWEL DISPOSABLE) ×3 IMPLANT
TOWEL OR 17X26 10 PK STRL BLUE (TOWEL DISPOSABLE) ×3 IMPLANT
TRAP SPECIMEN MUCOUS 40CC (MISCELLANEOUS) ×3 IMPLANT
WATER STERILE IRR 1000ML POUR (IV SOLUTION) ×3 IMPLANT

## 2014-06-16 NOTE — Op Note (Signed)
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  Asst. Surgeon: Lovell SheehanJenkins  Anesthesia: General  Indication: 49 year old female with neck and bilateral upper extremity pain paresthesias and weakness. Workup demonstrates evidence of a very large C5-6 disc herniation with marked spinal cord compression. Patient presents now for anterior cervical decompression and fusion. Her situation is complicated by her morbid obesity.  Operative note: After induction of anesthesia, patient positioned supine with neck slightly extended and held in place with halter traction. Patient's anterior cervical region was prepped and draped sterilely. Incision was made overlying the C5-6 interspace. This carried down sharply to the platysma. Platysmas and divided vertically and dissection proceeded along the medial border of the sternocleidomastoid muscle and carotid sheath. Trachea and esophagus were mobilized and retracted towards the left. Prevertebral fascia was stripped off the anterior spinal column. Longus Cole muscles elevated bilaterally using large cautery. Deep self-retaining placed intraoperative fluoroscopy was used and attempts were made to localize the level of exposure. Markers were placed in both the C3-4 and C4-5 disc spaces. Extensive intraoperative fluoroscopy was used at varying intensities and angles trying to localize the level. Given her size intraoperative localization with suboptimal at best. It appeared that the marker was at the C3-4 level. This coincided with the gross appearance of the C5-6 level and the expected disc space narrowing and  anterior osteophytic disease. I felt that this was the best imaging I could obtain given her size. I elected to move forward with a discectomy at the C5-6 level. Disc space is then incised with 15 blade in a rectangular fashion. Wide disc space cleanout was achieved using pituitary rongeurs forward and backward angle Carlen curettes Kerrison rongeurs and high-speed drill. All missed the disc were removed down to the level of the posterior annulus. Microscope was then brought into the field used throughout the remainder of the discectomy. Remaining aspects annulus and osteophytes removed using a high-speed drill down to the level of the posterior [place] ligament. Posterior ligament was elevated and resected in piecemeal fashion. A large amount of subligamentous disc herniation was encountered. These were resected and multiple large fragments. Decompression then proceeded out each neural foramen. Wide anterior foraminotomies were performed on course exiting C6 nerve roots bilaterally. At this point a very thorough decompression been achieved. There was no evidence of injury to the thecal sac and nerve roots. Wound was then irrigated with antibiotic solution. A 6 mm Medtronic anatomic peek cage was packed with locally harvested autograft and impacted into place. This cage was recessed slightly from the anterior cortical margin. 0.5 mm Atlantis anterior cervical plate was then placed over the C5 and C6 levels. This then attached under fluoroscopic guidance using 13 mm variable angle screws. 2 weeks at both levels. All 4 screws were given the final tightening and found to be solidly within the bone. Locking screws were engaged both levels. Final images revealed good position of the bone grafts and hardware at the proper operative level with normal alignment of the spine. Wound was irrigated one final time. Interoperative x-rays were not performed at this point given the unlikelihood of any useful information  garnered. Wounds and closed in layers in a typical fashion. Steri-Strips triggers were applied. No apparent complications.  Patient tolerated the procedure well and she returns to the recovery room postoperatively.

## 2014-06-16 NOTE — Progress Notes (Signed)
Postop day 3. Patient remains somnolent. She will awaken and answer a few simple questions. Follows commands with all 4 extremities. Strength appears equal and intact. Wound healing well. Still with some dysphagia but this also seems to be improving.  Progressing slowly following 2 level anterior cervical decompression and fusion. We'll begin to slowly mobilize. Continue cervical collar.

## 2014-06-16 NOTE — Anesthesia Procedure Notes (Signed)
Procedure Name: Intubation Date/Time: 06/16/2014 11:27 AM Performed by: Jerilee HohMUMM, Bruchy Mikel N Pre-anesthesia Checklist: Patient identified, Emergency Drugs available, Suction available and Patient being monitored Patient Re-evaluated:Patient Re-evaluated prior to inductionOxygen Delivery Method: Circle system utilized Preoxygenation: Pre-oxygenation with 100% oxygen Intubation Type: IV induction Ventilation: Mask ventilation without difficulty Laryngoscope Size: Mac and 3 Grade View: Grade I Tube type: Oral Tube size: 7.0 mm Number of attempts: 1 Airway Equipment and Method: Stylet Placement Confirmation: ETT inserted through vocal cords under direct vision,  positive ETCO2 and breath sounds checked- equal and bilateral Secured at: 22 cm Tube secured with: Tape Dental Injury: Teeth and Oropharynx as per pre-operative assessment

## 2014-06-16 NOTE — Transfer of Care (Signed)
Immediate Anesthesia Transfer of Care Note  Patient: Tiffany Gomez  Procedure(s) Performed: Procedure(s): ANTERIOR CERVICAL DECOMPRESSION/DISCECTOMY FUSION CERVICAL FIVE-SIX (N/A)  Patient Location: PACU  Anesthesia Type:General  Level of Consciousness: awake, alert , oriented and patient cooperative  Airway & Oxygen Therapy: Patient Spontanous Breathing and Patient connected to face mask oxygen  Post-op Assessment: Report given to RN, Post -op Vital signs reviewed and stable and Patient moving all extremities  Post vital signs: Reviewed and stable  Last Vitals:  Filed Vitals:   06/16/14 0946  BP: 138/99  Pulse: 81  Temp: 36.4 C  Resp: 20    Complications: No apparent anesthesia complications

## 2014-06-16 NOTE — H&P (Signed)
Tiffany Gomez is an 49 y.o. female.   Chief Complaint: Neck pain HPI: 49 year old female with severe neck pain with radiation of both upper extremities with significant numbness, paresthesias and weakness. Workup demonstrates evidence that very large central disc herniation at C5-6 with critical spinal cord compression. Patient presents now for C5-6 anterior cervical discectomy and fusion.  Past Medical History  Diagnosis Date  . Diabetes   . Sleep apnea   . Leg pain   . Asthma   . Depression   . Morbid obesity   . Essential hypertension   . Hyperlipidemia   . Anxiety     Panic attacks    Past Surgical History  Procedure Laterality Date  . Gastric bypass    . Laparoscopic cholecystectomy    . Cardiac catheterization      Family History  Problem Relation Age of Onset  . Heart failure Mother    Social History:  reports that she has never smoked. She does not have any smokeless tobacco history on file. She reports that she does not drink alcohol or use illicit drugs.  Allergies: No Known Allergies  Medications Prior to Admission  Medication Sig Dispense Refill  . albuterol (PROVENTIL HFA;VENTOLIN HFA) 108 (90 BASE) MCG/ACT inhaler Inhale 2 puffs into the lungs every 6 (six) hours as needed for wheezing or shortness of breath.    . alprazolam (XANAX) 2 MG tablet Take 2 mg by mouth 3 (three) times daily as needed for anxiety.    . beclomethasone (QVAR) 80 MCG/ACT inhaler Inhale 2 puffs into the lungs daily.    . clonazePAM (KLONOPIN) 1 MG tablet Take 2 mg by mouth at bedtime.   4  . clonazePAM (KLONOPIN) 1 MG tablet Take 1 mg by mouth 3 (three) times daily as needed for anxiety.    . cyclobenzaprine (FLEXERIL) 10 MG tablet Take 10 mg by mouth 3 (three) times daily as needed for muscle spasms.     Marland Kitchen lisinopril (PRINIVIL,ZESTRIL) 2.5 MG tablet Take 2.5 mg by mouth daily.   2  . megestrol (MEGACE) 40 MG tablet Take 40 mg by mouth daily.   3  . metFORMIN (GLUCOPHAGE) 500 MG  tablet Take 500 mg by mouth 2 (two) times daily with a meal.   5  . montelukast (SINGULAIR) 10 MG tablet Take 10 mg by mouth at bedtime.    Marland Kitchen venlafaxine (EFFEXOR) 75 MG tablet Take 75 mg by mouth 2 (two) times daily with a meal.   2  . ziprasidone (GEODON) 80 MG capsule Take 160 mg by mouth at bedtime.   2    Results for orders placed or performed during the hospital encounter of 06/16/14 (from the past 48 hour(s))  Glucose, capillary     Status: Abnormal   Collection Time: 06/16/14  9:49 AM  Result Value Ref Range   Glucose-Capillary 121 (H) 70 - 99 mg/dL   No results found.  Review of Systems  Constitutional: Negative.   HENT: Negative.   Eyes: Negative.   Respiratory: Negative.   Cardiovascular: Negative.   Gastrointestinal: Negative.   Genitourinary: Negative.   Musculoskeletal: Negative.   Skin: Negative.   Neurological: Negative.   Endo/Heme/Allergies: Negative.   Psychiatric/Behavioral: Negative.     Blood pressure 138/99, pulse 81, temperature 97.6 F (36.4 C), temperature source Oral, resp. rate 20, height  (1.702 m), weight 166.243 kg (366 lb 8 oz), SpO2 100 %. Physical Exam  Constitutional: She is oriented to person, place, and time.  She appears well-developed. No distress.  Morbidly obese  HENT:  Head: Normocephalic and atraumatic.  Right Ear: External ear normal.  Left Ear: External ear normal.  Nose: Nose normal.  Mouth/Throat: Oropharynx is clear and moist. No oropharyngeal exudate.  Eyes: Conjunctivae and EOM are normal. Pupils are equal, round, and reactive to light. Right eye exhibits no discharge. Left eye exhibits no discharge.  Neck: Normal range of motion. Neck supple. No tracheal deviation present. No thyromegaly present.  Cardiovascular: Normal rate, regular rhythm, normal heart sounds and intact distal pulses.  Exam reveals no friction rub.   No murmur heard. Respiratory: Effort normal and breath sounds normal. No respiratory distress. She  has no wheezes.  GI: Soft. Bowel sounds are normal. She exhibits no distension. There is no tenderness.  Musculoskeletal: Normal range of motion.  Neurological: She is alert and oriented to person, place, and time. She has normal reflexes. She displays normal reflexes. No cranial nerve deficit. She exhibits normal muscle tone. Coordination normal.  Skin: Skin is warm and dry. No rash noted. She is not diaphoretic. No erythema. No pallor.  Psychiatric: She has a normal mood and affect. Her behavior is normal. Judgment and thought content normal.     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 Plan C5-6 anterior cervical discectomy with interbody fusion utilizing interbody peek cage, locally harvested autograft, and anterior plate instrumentation. Risks and benefits of been explained. Patient wishes to proceed.  Obdulio Mash A 06/16/2014, 10:29 AM

## 2014-06-16 NOTE — Progress Notes (Signed)
Orthopedic Tech Progress Note Patient Details:  Tiffany Gomez Jan 25, 1966 [id number]  Ortho Devices Type of Ortho Device: Soft collar Ortho Device/Splint Location: neck Ortho Device/Splint Interventions: Application   Tiffany Gomez 06/16/2014, 1:35 PM

## 2014-06-16 NOTE — Progress Notes (Signed)
Pt transported by Tawanna Coolerra and Lauren NT

## 2014-06-16 NOTE — Brief Op Note (Signed)
06/16/2014  12:49 PM  PATIENT:  Darrall Dearsammy Renna Stacks  49 y.o. female  PRE-OPERATIVE DIAGNOSIS:  HERNIATED NUCLEUS PULPOSUS  POST-OPERATIVE DIAGNOSIS:  HERNIATED NUCLEUS PULPOSUS  PROCEDURE:  Procedure(s): ANTERIOR CERVICAL DECOMPRESSION/DISCECTOMY FUSION CERVICAL FIVE-SIX (N/A)  SURGEON:  Surgeon(s) and Role:    * Julio SicksHenry Donelle Baba, MD - Primary    * Tressie StalkerJeffrey Jenkins, MD - Assisting  PHYSICIAN ASSISTANT:   ASSISTANTS:    ANESTHESIA:   Gen.  EBL:  Total I/O In: 1000 [I.V.:1000] Out: 50 [Blood:50]  BLOOD ADMINISTERED:none  DRAINS: none   LOCAL MEDICATIONS USED:  NONE  SPECIMEN:  No Specimen  DISPOSITION OF SPECIMEN:  N/A  COUNTS:  YES  TOURNIQUET:  * No tourniquets in log *  DICTATION: .Dragon Dictation  PLAN OF CARE: Admit to inpatient   PATIENT DISPOSITION:  PACU - hemodynamically stable.   Delay start of Pharmacological VTE agent (>24hrs) due to surgical blood loss or risk of bleeding: yes

## 2014-06-16 NOTE — Progress Notes (Signed)
Pt arrived to 4N04 at current time.  Pt A&O x 4, c/o 7/10 anterior neck surgical pain, site covered with CDI gauze dressing, no drains.   Pt V/S taken,pt still on O2 from PACU, fluids running at 75 cc/hr.  Pt DTV. Pt without distress. Diet ordered, will monitor.

## 2014-06-16 NOTE — Anesthesia Preprocedure Evaluation (Signed)
Anesthesia Evaluation  Patient identified by MRN, date of birth, ID band Patient awake    Reviewed: Allergy & Precautions, NPO status , Patient's Chart, lab work & pertinent test results  Airway Mallampati: II       Dental  (+) Teeth Intact, Dental Advisory Given   Pulmonary  breath sounds clear to auscultation  + decreased breath sounds      Cardiovascular hypertension, Rhythm:Regular Rate:Normal     Neuro/Psych    GI/Hepatic   Endo/Other  diabetes  Renal/GU      Musculoskeletal   Abdominal (+) + obese,   Peds  Hematology   Anesthesia Other Findings   Reproductive/Obstetrics                             Anesthesia Physical Anesthesia Plan  ASA: III  Anesthesia Plan: General   Post-op Pain Management:    Induction: Intravenous  Airway Management Planned: Oral ETT  Additional Equipment:   Intra-op Plan:   Post-operative Plan: Extubation in OR  Informed Consent: I have reviewed the patients History and Physical, chart, labs and discussed the procedure including the risks, benefits and alternatives for the proposed anesthesia with the patient or authorized representative who has indicated his/her understanding and acceptance.   Dental advisory given  Plan Discussed with: CRNA and Anesthesiologist  Anesthesia Plan Comments: (HNP C5-6 Hypertension Type 2 DM Morbid obesity, S/P gastric bypass Asthma lungs clear  Plan GA with oral ETT  Kipp Broodavid Jaylena Holloway)        Anesthesia Quick Evaluation

## 2014-06-16 NOTE — Anesthesia Postprocedure Evaluation (Signed)
  Anesthesia Post-op Note  Patient: Tiffany Gomez  Procedure(s) Performed: Procedure(s): ANTERIOR CERVICAL DECOMPRESSION/DISCECTOMY FUSION CERVICAL FIVE-SIX (N/A)  Patient Location: PACU  Anesthesia Type:General  Level of Consciousness: awake, alert  and oriented  Airway and Oxygen Therapy: Patient Spontanous Breathing  Post-op Pain: mild  Post-op Assessment: Post-op Vital signs reviewed, Patient's Cardiovascular Status Stable, Respiratory Function Stable, Patent Airway and Pain level controlled  Post-op Vital Signs: stable  Last Vitals:  Filed Vitals:   06/16/14 1736  BP:   Pulse: 91  Temp:   Resp: 21    Complications: No apparent anesthesia complications

## 2014-06-17 ENCOUNTER — Encounter (HOSPITAL_COMMUNITY): Payer: Self-pay | Admitting: Neurosurgery

## 2014-06-17 LAB — GLUCOSE, CAPILLARY: Glucose-Capillary: 179 mg/dL — ABNORMAL HIGH (ref 70–99)

## 2014-06-17 MED ORDER — HYDROCODONE-ACETAMINOPHEN 5-325 MG PO TABS: 1.0000 | ORAL_TABLET | ORAL | Status: DC | PRN

## 2014-06-17 NOTE — Progress Notes (Signed)
PT Cancellation Note  Patient Details Name: Tiffany Gomez [name] MRN: [id number] DOB: 09-20-1965   Cancelled Treatment:    Reason Eval/Treat Not Completed: PT screened, no needs identified, will sign off Pt worked with OT earlier in day and per OT notes, independent with all mobility. Pt dressed and ready to be d/ced home upon PT arrival. Answered all questions. Declined mobility as pt feels safe and comfortable with ambulation. Will sign off.   Alvie HeidelbergFolan, Raimi Guillermo A 06/17/2014, 1:46 PM  Alvie HeidelbergShauna Folan, PT, DPT [phone]

## 2014-06-17 NOTE — Progress Notes (Signed)
Pt discharging with family at this time alert, verbal taking all personal belongings home. She denies pain or discomfort. IV discontinued dry dressing applied. Discharge instructions and prescriptions provided to pt with follow up appts she is aware of with verbal understanding. Surgical site clean, dry and intact. Cervical collar on and aligned. Pt has no other questions or concerns.

## 2014-06-17 NOTE — Evaluation (Signed)
  Occupational Therapy Evaluation Patient Details Name: Tiffany Gomez MRN: [id number] DOB: March 27, 1966 Today's Date: 06/17/2014    History of Present Illness 49 yo s/p C5-6 anterior cervical discectomy and fusion   Clinical Impression   Patient admitted with above. Patient independent PTA. Patient currently functioning at an overall independent level, requiring occasional mod I. D/C from acute OT services and no additional follow-up OT needs at this time. All appropriate education provided to patient. Please re-order OT if needed.      Follow Up Recommendations  No OT follow up;Supervision - Intermittent    Equipment Recommendations  None recommended by OT    Recommendations for Other Services  None at this time     Precautions / Restrictions Precautions Precautions: Cervical Precaution Comments: Reviewed cervical precautions  Required Braces or Orthoses: Cervical Brace Cervical Brace: Soft collar Restrictions Weight Bearing Restrictions: No      Mobility Bed Mobility General bed mobility comments: Patient seated in recliner upon OT entering room  Transfers Overall transfer level: Independent General transfer comment: patient able to perform safe transfer <> recliner and <> elevated toilet seat with no cueing needed    Balance Overall balance assessment: No apparent balance deficits (not formally assessed)    ADL Overall ADL's : Independent;At baseline General ADL Comments: Patient able to reach BLEs for LB ADLs. Patient overall independent for functional mobility and transfers at this time, no LOB noted during functional activity.     Pertinent Vitals/Pain Pain Assessment: 0-10 Pain Score: 7  Pain Location: neck Pain Descriptors / Indicators: Stabbing Pain Intervention(s): Monitored during session;Premedicated before session;Repositioned     Hand Dominance Right   Extremity/Trunk Assessment Upper Extremity Assessment Upper Extremity Assessment: Overall  WFL for tasks assessed   Lower Extremity Assessment Lower Extremity Assessment: Defer to PT evaluation       Communication Communication Communication: No difficulties   Cognition Arousal/Alertness: Awake/alert Behavior During Therapy: WFL for tasks assessed/performed Overall Cognitive Status: Within Functional Limits for tasks assessed              Home Living Family/patient expects to be discharged to:: Private residence Living Arrangements: Alone Available Help at Discharge: Family;Friend(s);Available PRN/intermittently (mom and "I have different people coming in") Type of Home: Apartment Home Access: Level entry     Home Layout: One level     Bathroom Shower/Tub: Tub/shower unit;Curtain   FirefighterBathroom Toilet: Handicapped height     Home Equipment: None    Prior Functioning/Environment Level of Independence: Independent     OT Diagnosis:  n/a, no acute or follow-up needs   OT Problem List:   n/a, no acute or follow-up needs   OT Treatment/Interventions:   n/a, no acute or follow-up needs   OT Goals(Current goals can be found in the care plan section) Acute Rehab OT Goals Patient Stated Goal: go home today  OT Frequency:   n/a, no acute or follow-up needs   Barriers to D/C:  None known at this time          End of Session Equipment Utilized During Treatment: Cervical collar  Activity Tolerance: Patient tolerated treatment well Patient left: in chair;with call bell/phone within reach;with chair alarm set   Time: [id number] OT Time Calculation (min): 15 min Charges:  OT General Charges $OT Visit: 1 Procedure OT Evaluation $Initial OT Evaluation Tier I: 1 Procedure  [name] , MS, OTR/L, CLT Pager: [phone]  06/17/2014, 10:04 AM

## 2014-06-17 NOTE — Discharge Instructions (Signed)

## 2014-06-17 NOTE — Care Management Note (Signed)
    Page 1 of 1   06/17/2014     1:58:40 PM CARE MANAGEMENT NOTE 06/17/2014  [facility]   Account Number:  [id number]  Date Initiated:  06/17/2014  Documentation initiated by:  Elmer BalesOBARGE,Sharonna Vinje  Subjective/Objective Assessment:   Patient was admitted for ACDF. Lives at home alone.     Action/Plan:   Will follow for discharge needs pending PT/OT evals and physician orders.   Anticipated DC Date:  06/17/2014   Anticipated DC Plan:  HOME/SELF CARE         Choice offered to / List presented to:             Status of service:  Completed, signed off Medicare Important Message given?  NA - LOS <3 / Initial given by admissions (If response is "NO", the following Medicare IM given date fields will be blank) Date Medicare IM given:   Medicare IM given by:   Date Additional Medicare IM given:   Additional Medicare IM given by:    Discharge Disposition:  HOME/SELF CARE  Per UR Regulation:  Reviewed for med. necessity/level of care/duration of stay  If discussed at Long Length of Stay Meetings, dates discussed:    Comments:  06/17/14 Elmer Balesourtney Doriann Zuch RN, MSN, CM- Per conversation with Dr Jordan LikesPool, patient has no discharge needs.

## 2014-06-17 NOTE — Discharge Summary (Signed)
Physician Discharge Summary  Patient ID: Tiffany Gomez MRN: [id number] DOB/AGE: Feb 29, 1966 49 y.o.  Admit date: 06/16/2014 Discharge date: 06/17/2014  Admission Diagnoses:  Discharge Diagnoses:  Principal Problem:   HNP (herniated nucleus pulposus) with myelopathy, cervical Active Problems:   Morbid obesity   Cervical herniated disc   Discharged Condition: good  Hospital Course: The patient was admitted to the hospital where she underwent and, gait at C5-6 ACDF. Postoperative she is doing very well. Preoperative neck and upper extremity symptoms much improved. She is up ambulating without difficulty. Her pain is well-controlled. She is ready for discharge home.  Consults:   Significant Diagnostic Studies:   Treatments:   Discharge Exam: Blood pressure 131/82, pulse 79, temperature 97.7 F (36.5 C), temperature source Oral, resp. rate 20, height 5\' 7"  (1.702 m), weight 166.243 kg (366 lb 8 oz), SpO2 100 %. Awake and alert. Oriented and appropriate. Motor and sensory examination intact. Wound clean and dry. Chest and abdomen benign.  Disposition: Final discharge disposition not confirmed     Medication List    TAKE these medications        albuterol 108 (90 BASE) MCG/ACT inhaler  Commonly known as:  PROVENTIL HFA;VENTOLIN HFA  Inhale 2 puffs into the lungs every 6 (six) hours as needed for wheezing or shortness of breath.     alprazolam 2 MG tablet  Commonly known as:  XANAX  Take 2 mg by mouth 3 (three) times daily as needed for anxiety.     beclomethasone 80 MCG/ACT inhaler  Commonly known as:  QVAR  Inhale 2 puffs into the lungs daily.     clonazePAM 1 MG tablet  Commonly known as:  KLONOPIN  Take 1 mg by mouth 3 (three) times daily as needed for anxiety.     clonazePAM 1 MG tablet  Commonly known as:  KLONOPIN  Take 2 mg by mouth at bedtime.     cyclobenzaprine 10 MG tablet  Commonly known as:  FLEXERIL  Take 10 mg by mouth 3 (three) times daily as  needed for muscle spasms.     HYDROcodone-acetaminophen 5-325 MG per tablet  Commonly known as:  NORCO/VICODIN  Take 1-2 tablets by mouth every 4 (four) hours as needed (mild pain).     lisinopril 2.5 MG tablet  Commonly known as:  PRINIVIL,ZESTRIL  Take 2.5 mg by mouth daily.     megestrol 40 MG tablet  Commonly known as:  MEGACE  Take 40 mg by mouth daily.     metFORMIN 500 MG tablet  Commonly known as:  GLUCOPHAGE  Take 500 mg by mouth 2 (two) times daily with Gomez meal.     montelukast 10 MG tablet  Commonly known as:  SINGULAIR  Take 10 mg by mouth at bedtime.     venlafaxine 75 MG tablet  Commonly known as:  EFFEXOR  Take 75 mg by mouth 2 (two) times daily with Gomez meal.     ziprasidone 80 MG capsule  Commonly known as:  GEODON  Take 160 mg by mouth at bedtime.           Follow-up Information    Follow up with Temple PaciniPOOL,Tiffany Cutshaw A, MD.   Specialty:  Neurosurgery   Contact information:   [address] [place] [address] [phone]       Signed: Temple PaciniOOL,Keniah Klemmer Gomez 06/17/2014, 1:30 PM

## 2014-06-17 NOTE — Progress Notes (Signed)
Inpatient Diabetes Program Recommendations  AACE/ADA: New Consensus Statement on Inpatient Glycemic Control (2013)  Target Ranges:  Prepandial:   less than 140 mg/dL      Peak postprandial:   less than 180 mg/dL (1-2 hours)      Critically ill patients:  140 - 180 mg/dL   Reason for Assessment:Results for Tiffany DearsSHEPARD, Tiffany Gomez (MRN [id number]) as of 06/17/2014 11:47  Ref. Range 06/16/2014 09:49 06/16/2014 13:02 06/16/2014 13:30 06/16/2014 22:23  Glucose-Capillary Latest Range: 70-99 mg/dL 130121 (H) 865157 (H) 784153 (H) 274 (H)     Diabetes history: Diabetes Outpatient Diabetes medications: Metformin 500 mg bid Current orders for Inpatient glycemic control:  Metformin 500 mg bid  May consider checking CBG's tid with meals and HS.  Also consider adding Novolog moderate correction tid with meals and HS.  Thanks, Beryl MeagerJenny Josedaniel Haye, RN, BC-ADM Inpatient Diabetes Coordinator Pager [phone]

## 2014-06-17 NOTE — Progress Notes (Signed)
UR complete.  Janiesha Diehl RN, MSN 

## 2014-06-20 ENCOUNTER — Ambulatory Visit: Payer: Medicare Other | Admitting: Cardiovascular Disease

## 2014-07-10 ENCOUNTER — Ambulatory Visit: Payer: Medicare Other | Admitting: Cardiovascular Disease

## 2014-07-16 ENCOUNTER — Ambulatory Visit
Admit: 2014-07-16 | Disposition: A | Discharge status: Home / Self Care | Payer: Self-pay | Attending: Family Medicine | Admitting: Family Medicine

## 2014-07-21 ENCOUNTER — Ambulatory Visit: Payer: Medicare Other | Admitting: Cardiovascular Disease

## 2014-08-07 ENCOUNTER — Ambulatory Visit: Payer: Medicare Other | Admitting: Cardiovascular Disease

## 2014-08-07 ENCOUNTER — Ambulatory Visit: Payer: Medicare Other | Admitting: Pain Medicine

## 2014-08-18 ENCOUNTER — Other Ambulatory Visit: Payer: Self-pay | Admitting: Neurosurgery

## 2014-08-18 DIAGNOSIS — M5416 Radiculopathy, lumbar region: Secondary | ICD-10-CM

## 2014-08-22 ENCOUNTER — Ambulatory Visit
Admission: RE | Admit: 2014-08-22 | Discharge: 2014-08-22 | Disposition: A | Discharge status: Home / Self Care | Payer: Medicare Other | Source: Ambulatory Visit | Attending: Neurosurgery | Admitting: Neurosurgery

## 2014-08-22 ENCOUNTER — Ambulatory Visit: Payer: Medicare Other | Admitting: Cardiovascular Disease

## 2014-08-22 DIAGNOSIS — M5416 Radiculopathy, lumbar region: Secondary | ICD-10-CM | POA: Diagnosis not present

## 2014-08-26 ENCOUNTER — Ambulatory Visit: Payer: Medicare Other

## 2014-09-01 ENCOUNTER — Other Ambulatory Visit: Payer: Self-pay | Admitting: Pain Medicine

## 2014-09-01 DIAGNOSIS — M5000 Cervical disc disorder with myelopathy, unspecified cervical region: Secondary | ICD-10-CM

## 2014-09-01 DIAGNOSIS — M5020 Other cervical disc displacement, unspecified cervical region: Secondary | ICD-10-CM

## 2014-09-01 DIAGNOSIS — M5030 Other cervical disc degeneration, unspecified cervical region: Secondary | ICD-10-CM

## 2014-09-02 ENCOUNTER — Ambulatory Visit: Payer: Medicare Other | Attending: Pain Medicine | Admitting: Pain Medicine

## 2014-09-02 ENCOUNTER — Encounter: Payer: Self-pay | Admitting: Pain Medicine

## 2014-09-02 VITALS — BP 146/98 | HR 86 | Temp 98.1°F | Resp 16 | Ht 66.0 in | Wt 350.0 lb

## 2014-09-02 DIAGNOSIS — Z9889 Other specified postprocedural states: Secondary | ICD-10-CM | POA: Diagnosis not present

## 2014-09-02 DIAGNOSIS — M47896 Other spondylosis, lumbar region: Secondary | ICD-10-CM | POA: Insufficient documentation

## 2014-09-02 DIAGNOSIS — M5030 Other cervical disc degeneration, unspecified cervical region: Secondary | ICD-10-CM | POA: Insufficient documentation

## 2014-09-02 DIAGNOSIS — M5010 Cervical disc disorder with radiculopathy, unspecified cervical region: Secondary | ICD-10-CM

## 2014-09-02 DIAGNOSIS — M542 Cervicalgia: Secondary | ICD-10-CM | POA: Diagnosis present

## 2014-09-02 DIAGNOSIS — M5000 Cervical disc disorder with myelopathy, unspecified cervical region: Secondary | ICD-10-CM

## 2014-09-02 DIAGNOSIS — M79601 Pain in right arm: Secondary | ICD-10-CM | POA: Diagnosis present

## 2014-09-02 DIAGNOSIS — M47816 Spondylosis without myelopathy or radiculopathy, lumbar region: Secondary | ICD-10-CM | POA: Insufficient documentation

## 2014-09-02 DIAGNOSIS — M5020 Other cervical disc displacement, unspecified cervical region: Secondary | ICD-10-CM

## 2014-09-02 DIAGNOSIS — M5136 Other intervertebral disc degeneration, lumbar region: Secondary | ICD-10-CM | POA: Insufficient documentation

## 2014-09-02 DIAGNOSIS — M79602 Pain in left arm: Secondary | ICD-10-CM | POA: Diagnosis present

## 2014-09-02 DIAGNOSIS — M5416 Radiculopathy, lumbar region: Secondary | ICD-10-CM

## 2014-09-02 NOTE — Patient Instructions (Addendum)
Continue present medications.  Lumbar epidural steroid injection 09/15/2014. Do not take aspirin or similar products for 5 days prior to your procedure  F/U PCP for evaliation of  BP and general medical  condition.  F/U surgical evaluation.  F/U neurological evaluation.  May consider radiofrequency rhizolysis or intraspinal procedures pending response to present treatment and F/U evaluation.  Patient to call [facility] should patient have concerns prior to scheduled return appointment. GENERAL RISKS AND COMPLICATIONS  What are the risk, side effects and possible complications? Generally speaking, most procedures are safe.  However, with any procedure there are risks, side effects, and the possibility of complications.  The risks and complications are dependent upon the sites that are lesioned, or the type of nerve block to be performed.  The closer the procedure is to the spine, the more serious the risks are.  Great care is taken when placing the radio frequency needles, block needles or lesioning probes, but sometimes complications can occur. 1. Infection: Any time there is an injection through the skin, there is a risk of infection.  This is why sterile conditions are used for these blocks.  There are four possible types of infection. 1. Localized skin infection. 2. Central Nervous System Infection-This can be in the form of Meningitis, which can be deadly. 3. Epidural Infections-This can be in the form of an epidural abscess, which can cause pressure inside of the spine, causing compression of the spinal cord with subsequent paralysis. This would require an emergency surgery to decompress, and there are no guarantees that the patient would recover from the paralysis. 4. Discitis-This is an infection of the intervertebral discs.  It occurs in about 1% of discography procedures.  It is difficult to treat and it may lead to surgery.        2. Pain: the needles have to go through  skin and soft tissues, will cause soreness.       3. Damage to internal structures:  The nerves to be lesioned may be near blood vessels or    other nerves which can be potentially damaged.       4. Bleeding: Bleeding is more common if the patient is taking blood thinners such as  aspirin, Coumadin, Ticiid, Plavix, etc., or if he/she have some genetic predisposition  such as hemophilia. Bleeding into the spinal canal can cause compression of the spinal  cord with subsequent paralysis.  This would require an emergency surgery to  decompress and there are no guarantees that the patient would recover from the  paralysis.       5. Pneumothorax:  Puncturing of a lung is a possibility, every time a needle is introduced in  the area of the chest or upper back.  Pneumothorax refers to free air around the  collapsed lung(s), inside of the thoracic cavity (chest cavity).  Another two possible  complications related to a similar event would include: Hemothorax and Chylothorax.   These are variations of the Pneumothorax, where instead of air around the collapsed  lung(s), you may have blood or chyle, respectively.       6. Spinal headaches: They may occur with any procedures in the area of the spine.       7. Persistent CSF (Cerebro-Spinal Fluid) leakage: This is a rare problem, but may occur  with prolonged intrathecal or epidural catheters either due to the formation of a fistulous  track or a dural tear.       8. Nerve damage:  By working so close to the spinal cord, there is always a possibility of  nerve damage, which could be as serious as a permanent spinal cord injury with  paralysis.       9. Death:  Although rare, severe deadly allergic reactions known as "Anaphylactic  reaction" can occur to any of the medications used.      10. Worsening of the symptoms:  We can always make thing worse.  What are the chances of something like this happening? Chances of any of this occuring are extremely low.  By  statistics, you have more of a chance of getting killed in a motor vehicle accident: while driving to the hospital than any of the above occurring .  Nevertheless, you should be aware that they are possibilities.  In general, it is similar to taking a shower.  Everybody knows that you can slip, hit your head and get killed.  Does that mean that you should not shower again?  Nevertheless always keep in mind that statistics do not mean anything if you happen to be on the wrong side of them.  Even if a procedure has a 1 (one) in a 1,000,000 (million) chance of going wrong, it you happen to be that one..Also, keep in mind that by statistics, you have more of a chance of having something go wrong when taking medications.  Who should not have this procedure? If you are on a blood thinning medication (e.g. Coumadin, Plavix, see list of "Blood Thinners"), or if you have an active infection going on, you should not have the procedure.  If you are taking any blood thinners, please inform your physician.  How should I prepare for this procedure?  Do not eat or drink anything at least six hours prior to the procedure.  Bring a driver with you .  It cannot be a taxi.  Come accompanied by an adult that can drive you back, and that is strong enough to help you if your legs get weak or numb from the local anesthetic.  Take all of your medicines the morning of the procedure with just enough water to swallow them.  If you have diabetes, make sure that you are scheduled to have your procedure done first thing in the morning, whenever possible.  If you have diabetes, take only half of your insulin dose and notify our nurse that you have done so as soon as you arrive at the clinic.  If you are diabetic, but only take blood sugar pills (oral hypoglycemic), then do not take them on the morning of your procedure.  You may take them after you have had the procedure.  Do not take aspirin or any aspirin-containing  medications, at least eleven (11) days prior to the procedure.  They may prolong bleeding.  Wear loose fitting clothing that may be easy to take off and that you would not mind if it got stained with Betadine or blood.  Do not wear any jewelry or perfume  Remove any nail coloring.  It will interfere with some of our monitoring equipment.  NOTE: Remember that this is not meant to be interpreted as a complete list of all possible complications.  Unforeseen problems may occur.  BLOOD THINNERS The following drugs contain aspirin or other products, which can cause increased bleeding during surgery and should not be taken for 2 weeks prior to and 1 week after surgery.  If you should need take something for relief of minor pain, you may  take acetaminophen which is found in Tylenol,m Datril, Anacin-3 and Panadol. It is not blood thinner. The products listed below are.  Do not take any of the products listed below in addition to any listed on your instruction sheet.  A.P.C or A.P.C with Codeine Codeine Phosphate Capsules #3 Ibuprofen Ridaura  ABC compound Congesprin Imuran rimadil  Advil Cope Indocin Robaxisal  Alka-Seltzer Effervescent Pain Reliever and Antacid Coricidin or Coricidin-D  Indomethacin Rufen  Alka-Seltzer plus Cold Medicine Cosprin Ketoprofen S-A-C Tablets  Anacin Analgesic Tablets or Capsules Coumadin Korlgesic Salflex  Anacin Extra Strength Analgesic tablets or capsules CP-2 Tablets Lanoril Salicylate  Anaprox Cuprimine Capsules Levenox Salocol  Anexsia-D Dalteparin Magan Salsalate  Anodynos Darvon compound Magnesium Salicylate Sine-off  Ansaid Dasin Capsules Magsal Sodium Salicylate  Anturane Depen Capsules Marnal Soma  APF Arthritis pain formula Dewitt's Pills Measurin Stanback  Argesic Dia-Gesic Meclofenamic Sulfinpyrazone  Arthritis Bayer Timed Release Aspirin Diclofenac Meclomen Sulindac  Arthritis pain formula Anacin Dicumarol Medipren Supac  Analgesic (Safety coated)  Arthralgen Diffunasal Mefanamic Suprofen  Arthritis Strength Bufferin Dihydrocodeine Mepro Compound Suprol  Arthropan liquid Dopirydamole Methcarbomol with Aspirin Synalgos  ASA tablets/Enseals Disalcid Micrainin Tagament  Ascriptin Doan's Midol Talwin  Ascriptin A/D Dolene Mobidin Tanderil  Ascriptin Extra Strength Dolobid Moblgesic Ticlid  Ascriptin with Codeine Doloprin or Doloprin with Codeine Momentum Tolectin  Asperbuf Duoprin Mono-gesic Trendar  Aspergum Duradyne Motrin or Motrin IB Triminicin  Aspirin plain, buffered or enteric coated Durasal Myochrisine Trigesic  Aspirin Suppositories Easprin Nalfon Trillsate  Aspirin with Codeine Ecotrin Regular or Extra Strength Naprosyn Uracel  Atromid-S Efficin Naproxen Ursinus  Auranofin Capsules Elmiron Neocylate Vanquish  Axotal Emagrin Norgesic Verin  Azathioprine Empirin or Empirin with Codeine Normiflo Vitamin E  Azolid Emprazil Nuprin Voltaren  Bayer Aspirin plain, buffered or children's or timed BC Tablets or powders Encaprin Orgaran Warfarin Sodium  Buff-a-Comp Enoxaparin Orudis Zorpin  Buff-a-Comp with Codeine Equegesic Os-Cal-Gesic   Buffaprin Excedrin plain, buffered or Extra Strength Oxalid   Bufferin Arthritis Strength Feldene Oxphenbutazone   Bufferin plain or Extra Strength Feldene Capsules Oxycodone with Aspirin   Bufferin with Codeine Fenoprofen Fenoprofen Pabalate or Pabalate-SF   Buffets II Flogesic Panagesic   Buffinol plain or Extra Strength Florinal or Florinal with Codeine Panwarfarin   Buf-Tabs Flurbiprofen Penicillamine   Butalbital Compound Four-way cold tablets Penicillin   Butazolidin Fragmin Pepto-Bismol   Carbenicillin Geminisyn Percodan   Carna Arthritis Reliever Geopen Persantine   Carprofen Gold's salt Persistin   Chloramphenicol Goody's Phenylbutazone   Chloromycetin Haltrain Piroxlcam   Clmetidine heparin Plaquenil   Cllnoril Hyco-pap Ponstel   Clofibrate Hydroxy chloroquine Propoxyphen          Before stopping any of these medications, be sure to consult the physician who ordered them.  Some, such as Coumadin (Warfarin) are ordered to prevent or treat serious conditions such as "deep thrombosis", "pumonary embolisms", and other heart problems.  The amount of time that you may need off of the medication may also vary with the medication and the reason for which you were taking it.  If you are taking any of these medications, please make sure you notify your pain physician before you undergo any procedures.         Epidural Steroid Injection Patient Information  Description: The epidural space surrounds the nerves as they exit the spinal cord.  In some patients, the nerves can be compressed and inflamed by a bulging disc or a tight spinal canal (spinal stenosis).  By injecting steroids into the  epidural space, we can bring irritated nerves into direct contact with a potentially helpful medication.  These steroids act directly on the irritated nerves and can reduce swelling and inflammation which often leads to decreased pain.  Epidural steroids may be injected anywhere along the spine and from the neck to the low back depending upon the location of your pain.   After numbing the skin with local anesthetic (like Novocaine), a small needle is passed into the epidural space slowly.  You may experience a sensation of pressure while this is being done.  The entire block usually last less than 10 minutes.  Conditions which may be treated by epidural steroids:   Low back and leg pain  Neck and arm pain  Spinal stenosis  Post-laminectomy syndrome  Herpes zoster (shingles) pain  Pain from compression fractures  Preparation for the injection:  1. Do not eat any solid food or dairy products within 6 hours of your appointment.  2. You may drink clear liquids up to 2 hours before appointment.  Clear liquids include water, black coffee, juice or soda.  No milk or cream please. 3. You  may take your regular medication, including pain medications, with a sip of water before your appointment  Diabetics should hold regular insulin (if taken separately) and take 1/2 normal NPH dos the morning of the procedure.  Carry some sugar containing items with you to your appointment. 4. A driver must accompany you and be prepared to drive you home after your procedure.  5. Bring all your current medications with your. 6. An IV may be inserted and sedation may be given at the discretion of the physician.   7. A blood pressure cuff, EKG and other monitors will often be applied during the procedure.  Some patients may need to have extra oxygen administered for a short period. 8. You will be asked to provide medical information, including your allergies, prior to the procedure.  We must know immediately if you are taking blood thinners (like Coumadin/Warfarin)  Or if you are allergic to IV iodine contrast (dye). We must know if you could possible be pregnant.  Possible side-effects:  Bleeding from needle site  Infection (rare, may require surgery)  Nerve injury (rare)  Numbness & tingling (temporary)  Difficulty urinating (rare, temporary)  Spinal headache ( a headache worse with upright posture)  Light -headedness (temporary)  Pain at injection site (several days)  Decreased blood pressure (temporary)  Weakness in arm/leg (temporary)  Pressure sensation in back/neck (temporary)  Call if you experience:  Fever/chills associated with headache or increased back/neck pain.  Headache worsened by an upright position.  New onset weakness or numbness of an extremity below the injection site  Hives or difficulty breathing (go to the emergency room)  Inflammation or drainage at the infection site  Severe back/neck pain  Any new symptoms which are concerning to you  Please note:  Although the local anesthetic injected can often make your back or neck feel good for several  hours after the injection, the pain will likely return.  It takes 3-7 days for steroids to work in the epidural space.  You may not notice any pain relief for at least that one week.  If effective, we will often do a series of three injections spaced 3-6 weeks apart to maximally decrease your pain.  After the initial series, we generally will wait several months before considering a repeat injection of the same type.  If you have any questions,  please call [phone] [facility]

## 2014-09-02 NOTE — Progress Notes (Signed)
Safety precautions to be maintained throughout the outpatient stay will include: orient to surroundings, keep bed in low position, maintain call bell within reach at all times, provide assistance with transfer out of bed and ambulation.  

## 2014-09-02 NOTE — Progress Notes (Signed)
Discharged to home ambulatory.  Pre procedure instructions given to patient and visitor with teach back 3 done.

## 2014-09-02 NOTE — Progress Notes (Signed)
   Subjective:    Patient ID: Tiffany Gomez, female    DOB: [birth year], 49 y.o.   MRN: [id number]  HPI  Patient is 49 year old female [facility] for further evaluation and treatment of pain involving the neck and upper extremity regions and lower back and lower extremity regions patient is status post surgical intervention of the cervical region by Dr. Dutch QuintPoole returns today for evaluation and treatment of lower back and lower extremity pain involving the right lower extremity especially. On today's visit we reviewed patient's MRI of the lumbar spine and discuss patient's condition. Patient will follow-up with Dr. Dutch QuintPoole as discussed and we will proceed with lumbar epidural steroid injection at time of return appointment as discussed with patient on today's visit. The patient denies any specific trauma or change in events of daily living the call significant change in her symptoms and states that this symptom of the lower back and lower extremity region has been present for some time and appears to be becoming progressively more severe. Patient states that she is in hopes of being able to undergo procedure at this time an attempt to hopefully decrease symptoms. We will proceed with lumbar epidural steroid injection in attempt to decrease severity of symptoms hopefully minimize progression of patient's symptoms and hopefully avoid the need for more involved treatment. And agreement with suggested treatment plan     Review of Systems     Objective:   Physical Exam Tenderness to palpation of paraspinal musculature region cervical region cervical facet region with well-healed surgical scar of the anterior cervical region on the left without increased warmth erythema in the region of the scar. There was limited range of motion of the cervical spine with what appeared to be unremarkable Spurling's maneuver. There was questionably decreased grip strength and Tinel and Phalen's  maneuver without increased pain of any significant degree. Palpation of the thoracic facet thoracic paraspinal musculature region reproduced pain of degree without crepitus of the thoracic region noted. Palpation over the lumbar paraspinal musculature region lumbar facet region was associated with increased pain with lateral bending and rotation extension and palpation of the lumbar facets reproducing moderately severe discomfort. There was decreased to approximately 20 without a definite increase of pain with dorsiflexion noted. No definite sensory deficit of dermatomal distribution detected. There was mild tenderness of the PSIS and PII S region. There was negative clonus and negative Homans. Abdomen was nontender and no costovertebral angle tenderness was noted       Assessment & Plan:  Degenerative disc disease of the lumbar spine Mild to moderate multilevel disc degeneration and facet arthrosis worse at L5-S1 without significant spinal canal or neural foraminal stenosis. Mild asymmetric enlargement of the right L5 nerve root which could reflect radiculitis  Lumbar radiculitis/radiculopathy  Lumbar facet syndrome  Degenerative disc disease cervical spine Status post surgery of cervical region   Plan Continue present medications.  Lumbar epidural steroid injection performed at time of return appointment  F/U PCP for evaliation of  BP and general medical  condition.  F/U surgical evaluation. Patient will follow-up with Dr. Dutch QuintPoole for neurosurgical reevaluation as discussed.  F/U neurological evaluation.  May consider radiofrequency rhizolysis or intraspinal procedures pending response to present treatment and F/U evaluation.  Patient to call [facility] should patient have concerns prior to scheduled return appointment.

## 2014-09-15 ENCOUNTER — Encounter: Payer: Self-pay | Admitting: Pain Medicine

## 2014-09-15 ENCOUNTER — Ambulatory Visit: Payer: Medicare Other | Attending: Pain Medicine | Admitting: Pain Medicine

## 2014-09-15 VITALS — BP 114/62 | HR 75 | Temp 98.4°F | Resp 24 | Ht 66.0 in | Wt 360.0 lb

## 2014-09-15 DIAGNOSIS — M5030 Other cervical disc degeneration, unspecified cervical region: Secondary | ICD-10-CM

## 2014-09-15 DIAGNOSIS — M79605 Pain in left leg: Secondary | ICD-10-CM | POA: Diagnosis present

## 2014-09-15 DIAGNOSIS — M5010 Cervical disc disorder with radiculopathy, unspecified cervical region: Secondary | ICD-10-CM

## 2014-09-15 DIAGNOSIS — M5136 Other intervertebral disc degeneration, lumbar region: Secondary | ICD-10-CM | POA: Insufficient documentation

## 2014-09-15 DIAGNOSIS — M5020 Other cervical disc displacement, unspecified cervical region: Secondary | ICD-10-CM

## 2014-09-15 DIAGNOSIS — M545 Low back pain: Secondary | ICD-10-CM | POA: Diagnosis present

## 2014-09-15 DIAGNOSIS — M79604 Pain in right leg: Secondary | ICD-10-CM | POA: Diagnosis present

## 2014-09-15 DIAGNOSIS — M47816 Spondylosis without myelopathy or radiculopathy, lumbar region: Secondary | ICD-10-CM | POA: Diagnosis not present

## 2014-09-15 DIAGNOSIS — M5416 Radiculopathy, lumbar region: Secondary | ICD-10-CM

## 2014-09-15 MED ORDER — TRIAMCINOLONE ACETONIDE 40 MG/ML IJ SUSP
INTRAMUSCULAR | Status: AC
  Administered 2014-09-15: 13:00:00
  Filled 2014-09-15: qty 1

## 2014-09-15 MED ORDER — CEFAZOLIN SODIUM 1 G IJ SOLR
INTRAMUSCULAR | Status: AC
  Administered 2014-09-15: 1 g via INTRAVENOUS
  Filled 2014-09-15: qty 10

## 2014-09-15 MED ORDER — BUPIVACAINE HCL (PF) 0.25 % IJ SOLN
INTRAMUSCULAR | Status: AC
  Filled 2014-09-15: qty 30

## 2014-09-15 MED ORDER — FENTANYL CITRATE (PF) 100 MCG/2ML IJ SOLN
INTRAMUSCULAR | Status: AC
  Filled 2014-09-15: qty 2

## 2014-09-15 MED ORDER — SODIUM CHLORIDE 0.9 % IJ SOLN
INTRAMUSCULAR | Status: AC
  Administered 2014-09-15: 4 mL
  Filled 2014-09-15: qty 20

## 2014-09-15 MED ORDER — ORPHENADRINE CITRATE 30 MG/ML IJ SOLN
INTRAMUSCULAR | Status: AC
  Filled 2014-09-15: qty 2

## 2014-09-15 MED ORDER — LIDOCAINE HCL (PF) 1 % IJ SOLN
INTRAMUSCULAR | Status: AC
  Administered 2014-09-15: 5 mL
  Filled 2014-09-15: qty 5

## 2014-09-15 MED ORDER — MIDAZOLAM HCL 5 MG/5ML IJ SOLN
INTRAMUSCULAR | Status: AC
  Filled 2014-09-15: qty 5

## 2014-09-15 NOTE — Patient Instructions (Addendum)
Continue present medications and antibiotics Ceftin. Take Ceftin as prescribed  F/U PCP for evaliation of  BP and general medical  Condition.. Please see your primary care physician today for elevated blood pressure as discussed  F/U surgical evaluation as discussed  F/U neurological evaluation.  May consider radiofrequency rhizolysis or intraspinal procedures pending response to present treatment and F/U e   Please call [facility] should there be any change in your condition prior to scheduled return appointment     Pain Management Discharge Instructions  General Discharge Instructions :  If you need to reach your doctor call: Monday-Friday 8:00 am - 4:00 pm at [phone] or toll free [phone].  After clinic hours [phone] to have operator reach doctor.  Bring all of your medication bottles to all your appointments in the pain clinic.  To cancel or reschedule your appointment with Pain Management please remember to call 24 hours in advance to avoid a fee.  Refer to the educational materials which you have been given on: General Risks, I had my Procedure. Discharge Instructions, Post Sedation.  Post Procedure Instructions:   Please notify your doctor immediately if you have any unusual bleeding, trouble breathing or pain that is not related to your normal pain.  Depending on the type of procedure that was done, some parts of your body may feel week and/or numb.  This usually clears up by tonight or the next day.  Walk with the use of an assistive device or accompanied by an adult for the 24 hours.  You may use ice on the affected area for the first 24 hours.  Put ice in a Ziploc bag and cover with a towel and place against area 15 minutes on 15 minutes off.  You may switch to heat after 24 hours.

## 2014-09-15 NOTE — Progress Notes (Signed)
   Subjective:    Patient ID: Tiffany Gomez, female    DOB: 1966-02-25, 49 y.o.   MRN: [id number]  HPI  PROCEDURE PERFORMED: Lumbar epidural steroid injection   NOTE: The patient is a 49 y.o. female who returns to [facility] for further evaluation and treatment of pain involving the lumbar and lower extremity region.  MRI revealed the patient to be with  Degenerative changes lumbar spine with mild to moderate multilevel disc degeneration and facet arthrosis worse at L5-S1 without significant spinal canal or neural foraminal stenosis. Mild asymmetric enlargement of the right L5 nerve root which could reflect radiculitis. The risks, benefits, and expectations of the procedure have been discussed and explained to the patient who was understanding and in agreement with suggested treatment plan. We will proceed with interventional treatment as discussed and explained to the patient who is willing to proceed with procedure as planned.   DESCRIPTION OF PROCEDURE: Lumbar epidural steroid injection with IV Versed, IV fentanyl conscious sedation, EKG, blood pressure, pulse, and pulse oximetry monitoring. The procedure was performed with the patient in the prone position under fluoroscopic guidance. A local anesthetic skin wheal of 1.5% plain lidocaine was accomplished at proposed entry site. An 18-gauge Tuohy epidural needle was inserted at the L  5 vertebral body level  right of the midline via loss-of-resistance technique with negative heme and negative CSF return. A total of 4 mL of Preservative-Free normal saline with 40 mg of Kenalog injected incrementally via epidurally placed needle. Needle removed.  The patient tolerated the injection well.   PLAN:   1. Medications: We will continue presently prescribed medications. 2. Will consider modification of treatment regimen pending response to treatment rendered on today's visit and follow-up evaluation. 3. The patient is to follow-up with  primary care physician regarding blood pressure and general medical condition status post lumbar epidural steroid injection performed on today's visit. 4. Surgical evaluation. 5. Neurological evaluation. 6. The patient may be a candidate for radiofrequency procedures, implantation device, and other treatment pending response to treatment and follow-up evaluation. 7. The patient has been advised to adhere to proper body mechanics and avoid activities which appear to aggravate condition. 8. The patient has been advised to call the [facility] prior to scheduled return appointment should there be significant change in condition or should there be significant    The patient is understanding and in agreement with suggested treatment plan.     Review of Systems     Objective:   Physical Exam        Assessment & Plan:

## 2014-09-15 NOTE — Progress Notes (Signed)
Safety precautions to be maintained throughout the outpatient stay will include: orient to surroundings, keep bed in low position, maintain call bell within reach at all times, provide assistance with transfer out of bed and ambulation. TOLERATING PO FLUIDS WELL. 

## 2014-09-16 ENCOUNTER — Telehealth: Payer: Self-pay | Admitting: *Deleted

## 2014-09-16 NOTE — Telephone Encounter (Signed)
Left voice mail

## 2014-10-16 ENCOUNTER — Encounter: Payer: Self-pay | Admitting: Pain Medicine

## 2014-10-16 ENCOUNTER — Ambulatory Visit: Payer: Medicare Other | Attending: Pain Medicine | Admitting: Pain Medicine

## 2014-10-16 DIAGNOSIS — M5416 Radiculopathy, lumbar region: Secondary | ICD-10-CM

## 2014-10-16 DIAGNOSIS — M79604 Pain in right leg: Secondary | ICD-10-CM | POA: Diagnosis present

## 2014-10-16 DIAGNOSIS — Z9889 Other specified postprocedural states: Secondary | ICD-10-CM | POA: Diagnosis not present

## 2014-10-16 DIAGNOSIS — M5020 Other cervical disc displacement, unspecified cervical region: Secondary | ICD-10-CM

## 2014-10-16 DIAGNOSIS — M5136 Other intervertebral disc degeneration, lumbar region: Secondary | ICD-10-CM | POA: Insufficient documentation

## 2014-10-16 DIAGNOSIS — M5000 Cervical disc disorder with myelopathy, unspecified cervical region: Secondary | ICD-10-CM

## 2014-10-16 DIAGNOSIS — M5481 Occipital neuralgia: Secondary | ICD-10-CM | POA: Insufficient documentation

## 2014-10-16 DIAGNOSIS — M5126 Other intervertebral disc displacement, lumbar region: Secondary | ICD-10-CM | POA: Insufficient documentation

## 2014-10-16 DIAGNOSIS — M5010 Cervical disc disorder with radiculopathy, unspecified cervical region: Secondary | ICD-10-CM

## 2014-10-16 DIAGNOSIS — M5030 Other cervical disc degeneration, unspecified cervical region: Secondary | ICD-10-CM

## 2014-10-16 DIAGNOSIS — M545 Low back pain: Secondary | ICD-10-CM | POA: Diagnosis present

## 2014-10-16 DIAGNOSIS — M79605 Pain in left leg: Secondary | ICD-10-CM | POA: Diagnosis present

## 2014-10-16 DIAGNOSIS — M47896 Other spondylosis, lumbar region: Secondary | ICD-10-CM | POA: Diagnosis not present

## 2014-10-16 DIAGNOSIS — M47816 Spondylosis without myelopathy or radiculopathy, lumbar region: Secondary | ICD-10-CM

## 2014-10-16 NOTE — Progress Notes (Signed)
Safety precautions to be maintained throughout the outpatient stay will include: orient to surroundings, keep bed in low position, maintain call bell within reach at all times, provide assistance with transfer out of bed and ambulation.  

## 2014-10-16 NOTE — Patient Instructions (Addendum)
Continue present medications  Lumbar epidural steroid injection to be performed Monday, 10/27/2014  F/U PCP Tiffany Gomez for evaliation of  BP and general medical  condition.  F/U surgical evaluation. Neurosurgical reevaluation with Dr. Dutch Gomez as discussed  F/U neurological evaluation  May consider radiofrequency rhizolysis or intraspinal procedures pending response to present treatment and F/U evaluation.  Patient to call [facility] should patient have concerns prior to scheduled return appointment. Epidural Steroid Injection Patient Information  Description: The epidural space surrounds the nerves as they exit the spinal cord.  In some patients, the nerves can be compressed and inflamed by a bulging disc or a tight spinal canal (spinal stenosis).  By injecting steroids into the epidural space, we can bring irritated nerves into direct contact with a potentially helpful medication.  These steroids act directly on the irritated nerves and can reduce swelling and inflammation which often leads to decreased pain.  Epidural steroids may be injected anywhere along the spine and from the neck to the low back depending upon the location of your pain.   After numbing the skin with local anesthetic (like Novocaine), a small needle is passed into the epidural space slowly.  You may experience a sensation of pressure while this is being done.  The entire block usually last less than 10 minutes.  Conditions which may be treated by epidural steroids:   Low back and leg pain  Neck and arm pain  Spinal stenosis  Post-laminectomy syndrome  Herpes zoster (shingles) pain  Pain from compression fractures  Preparation for the injection:  1. Do not eat any solid food or dairy products within 6 hours of your appointment.  2. You may drink clear liquids up to 2 hours before appointment.  Clear liquids include water, black coffee, juice or soda.  No milk or cream please. 3. You may take  your regular medication, including pain medications, with a sip of water before your appointment  Diabetics should hold regular insulin (if taken separately) and take 1/2 normal NPH dos the morning of the procedure.  Carry some sugar containing items with you to your appointment. 4. A driver must accompany you and be prepared to drive you home after your procedure.  5. Bring all your current medications with your. 6. An IV may be inserted and sedation may be given at the discretion of the physician.   7. A blood pressure cuff, EKG and other monitors will often be applied during the procedure.  Some patients may need to have extra oxygen administered for a short period. 8. You will be asked to provide medical information, including your allergies, prior to the procedure.  We must know immediately if you are taking blood thinners (like Coumadin/Warfarin)  Or if you are allergic to IV iodine contrast (dye). We must know if you could possible be pregnant.  Possible side-effects:  Bleeding from needle site  Infection (rare, may require surgery)  Nerve injury (rare)  Numbness & tingling (temporary)  Difficulty urinating (rare, temporary)  Spinal headache ( a headache worse with upright posture)  Light -headedness (temporary)  Pain at injection site (several days)  Decreased blood pressure (temporary)  Weakness in arm/leg (temporary)  Pressure sensation in back/neck (temporary)  Call if you experience:  Fever/chills associated with headache or increased back/neck pain.  Headache worsened by an upright position.  New onset weakness or numbness of an extremity below the injection site  Hives or difficulty breathing (go to the emergency room)  Inflammation or  drainage at the infection site  Severe back/neck pain  Any new symptoms which are concerning to you  Please note:  Although the local anesthetic injected can often make your back or neck feel good for several hours after  the injection, the pain will likely return.  It takes 3-7 days for steroids to work in the epidural space.  You may not notice any pain relief for at least that one week.  If effective, we will often do a series of three injections spaced 3-6 weeks apart to maximally decrease your pain.  After the initial series, we generally will wait several months before considering a repeat injection of the same type.  If you have any questions, please call [phone] [facility]

## 2014-10-16 NOTE — Progress Notes (Signed)
   Subjective:    Patient ID: Tiffany Gomez, female    DOB: 1965-12-15, 49 y.o.   MRN: [id number]  HPI Patient is 49 year old female returns to [facility] for further evaluation and treatment of pain involving the region of the lower back and lower extremity regions. Patient is with history of pain involving the cervical upper extremity region with improvement of pain in the cervical and upper extremity region status post interventional treatment in [facility] and surgical intervention of the cervical region. At the present time patient states she has had improvement of her lower back lower extremity pain of significant degree status post interventional treatment performed in [facility]. Patient is undergone follow-up evaluation with neurosurgeon Dr. Dutch QuintPoole was recommended patient return to pain management Center to undergo another procedure. We discussed patient's condition and will proceed with lumbar epidural steroid injection to be performed at time return appointment. Patient admits to falling when patient tripped over her dog. We will proceed with lumbar epidural steroid injection at time return appointment in attempt to decrease severity of symptoms, minimize progression of symptoms, and avoid need for more involved treatment. The patient was understanding and in agreement with suggested treatment plan       Review of Systems     Objective:   Physical Exam There was tenderness of spleen scapula and occipitalis musketry region on the left as well as on the right. There was well-healed surgical scar the cervical region without increased warmth or erythema recent scar. Patient appeared to be with unremarkable Spurling's maneuver. There was bilaterally equal grip strength. Tinel and Phalen's maneuver were without increase of pain of significant degree. There was mild tends to palpation of the thoracic facet thoracic paraspinal musculature region with no  crepitus of the thoracic region noted. Palpation of the lumbar paraspinal musculature region lumbar facet region associated with moderate to moderately severe discomfort. Lateral bending and rotation extension and palpation lumbar facets reproduce moderate moderately severe discomfort. Straight leg raising was tolerated to approximately 20 without a definite increased pain dorsiflexion noted no clonus negative Homans. No definite sensory deficit of dermatomal just patient states. Patient appeared to be with decreased EHL strength. Abdomen nontender with no costovertebral maintenance noted.       Assessment & Plan:   Degenerative disc disease lumbar spine Mild to moderate multilevel degenerative changes with facet arthrosis worse at L5-S1 without significant spinal canal of neural foraminal stenosis mild asymmetric enlargement of the right L5 nerve root which could reflect a radiculitis, L5-S1 mild bulging disc with symptoms small central disc protrusion. L4-5 facet arthrosis. L3-4 disc bulging with facet arthropathy. L2-3 left foraminal/extra foraminal disc protrusion slightly displaces the exiting L2 nerve root. L1-2 shallow left foraminal/extra foraminal disc protrusion  Lumbar radiculopathy  Lumbar facet syndrome  Degenerative disc disease cervical spine Status post surgery cervical region  Bilateral occipital neuralgia   Plan   Continue present medications  Lumbar epidural steroid injection to be performed at time return appointment  F/U PCP Christina right for evaliation of  BP and general medical  condition.  F/U surgical evaluation. Patient will follow-up with Dr. Dutch QuintPoole for neurosurgical reevaluation status post lumbar epidural steroid injection to be performed in [facility]  F/U neurological evaluation  May consider radiofrequency rhizolysis or intraspinal procedures pending response to present treatment and F/U evaluation.  Patient to call [facility] should patient have concerns prior to scheduled return appointment.

## 2014-10-29 ENCOUNTER — Ambulatory Visit: Payer: Medicare Other | Attending: Pain Medicine | Admitting: Pain Medicine

## 2014-10-29 ENCOUNTER — Encounter: Payer: Self-pay | Admitting: Pain Medicine

## 2014-10-29 VITALS — BP 152/93 | HR 85 | Temp 97.8°F | Resp 16 | Ht 66.0 in | Wt 370.0 lb

## 2014-10-29 DIAGNOSIS — M5416 Radiculopathy, lumbar region: Secondary | ICD-10-CM

## 2014-10-29 DIAGNOSIS — M5030 Other cervical disc degeneration, unspecified cervical region: Secondary | ICD-10-CM

## 2014-10-29 DIAGNOSIS — M5010 Cervical disc disorder with radiculopathy, unspecified cervical region: Secondary | ICD-10-CM

## 2014-10-29 DIAGNOSIS — M5136 Other intervertebral disc degeneration, lumbar region: Secondary | ICD-10-CM | POA: Insufficient documentation

## 2014-10-29 DIAGNOSIS — M79605 Pain in left leg: Secondary | ICD-10-CM | POA: Diagnosis present

## 2014-10-29 DIAGNOSIS — M79604 Pain in right leg: Secondary | ICD-10-CM | POA: Diagnosis present

## 2014-10-29 DIAGNOSIS — M545 Low back pain: Secondary | ICD-10-CM | POA: Diagnosis present

## 2014-10-29 DIAGNOSIS — M5000 Cervical disc disorder with myelopathy, unspecified cervical region: Secondary | ICD-10-CM

## 2014-10-29 MED ORDER — FENTANYL CITRATE (PF) 100 MCG/2ML IJ SOLN
INTRAMUSCULAR | Status: AC
  Filled 2014-10-29: qty 2

## 2014-10-29 MED ORDER — CEFAZOLIN SODIUM 1 G IJ SOLR
INTRAMUSCULAR | Status: AC
  Administered 2014-10-29: 1 g via INTRAVENOUS
  Filled 2014-10-29: qty 10

## 2014-10-29 MED ORDER — ORPHENADRINE CITRATE 30 MG/ML IJ SOLN
INTRAMUSCULAR | Status: AC
  Filled 2014-10-29: qty 2

## 2014-10-29 MED ORDER — MIDAZOLAM HCL 5 MG/5ML IJ SOLN
INTRAMUSCULAR | Status: AC
  Filled 2014-10-29: qty 5

## 2014-10-29 MED ORDER — SODIUM CHLORIDE 0.9 % IJ SOLN
INTRAMUSCULAR | Status: AC
  Administered 2014-10-29: 11:00:00
  Filled 2014-10-29: qty 20

## 2014-10-29 MED ORDER — LIDOCAINE HCL (PF) 1 % IJ SOLN
INTRAMUSCULAR | Status: AC
  Administered 2014-10-29: 11:00:00
  Filled 2014-10-29: qty 5

## 2014-10-29 MED ORDER — CEFUROXIME AXETIL 250 MG PO TABS: 250.0000 mg | ORAL_TABLET | Freq: Two times a day (BID) | ORAL | Status: DC

## 2014-10-29 MED ORDER — TRIAMCINOLONE ACETONIDE 40 MG/ML IJ SUSP
INTRAMUSCULAR | Status: AC
  Administered 2014-10-29: 11:00:00
  Filled 2014-10-29: qty 1

## 2014-10-29 NOTE — Progress Notes (Signed)
Safety precautions to be maintained throughout the outpatient stay will include: orient to surroundings, keep bed in low position, maintain call bell within reach at all times, provide assistance with transfer out of bed and ambulation.  

## 2014-10-29 NOTE — Progress Notes (Signed)
   Subjective:    Patient ID: Tiffany Gomez, female    DOB: 04-03-1966, 49 y.o.   MRN: [id number]  HPI  PROCEDURE PERFORMED: Lumbar epidural steroid injection   NOTE: The patient is a 49 y.o. female who returns to [facility] for further evaluation and treatment of pain involving the lumbar and lower extremity region. MRI revealed the patient to be with degenerative disc disease lumbar spine with mild to moderate multilevel disc degeneration and facet arthrosis worse at right L5-S1. Asymmetric enlargement of the right L5 nerve root. The risks, benefits, and expectations of the procedure have been discussed and explained to the patient who was understanding and in agreement with suggested treatment plan. We will proceed with lumbar epidural steroid injection as discussed and as explained to the patient who is willing to proceed with procedure as planned.   DESCRIPTION OF PROCEDURE: Lumbar epidural steroid injection with   EKG, blood pressure, pulse, and pulse oximetry monitoring. The procedure was performed with the patient in the prone position under fluoroscopic guidance. A local anesthetic skin wheal of 1.5% plain lidocaine was accomplished at proposed entry site. An 18-gauge Tuohy epidural needle was inserted at the L 5 vertebral body level right of the midline via loss-of-resistance technique with negative heme and negative CSF return. A total of 4 mL of Preservative-Free normal saline with 40 mg of Kenalog injected incrementally via epidurally placed needle. Needle was removed.   A total of 40 mg of Kenalog was utilized for the procedure.   The patient tolerated the injection well.    PLAN:   1. Medications: We will continue presently prescribed medications. 2. Will consider modification of treatment regimen pending response to treatment rendered on today's visit and follow-up evaluation. 3. The patient is to follow-up with primary care physician Sharee Pimple  regarding  blood pressure and general medical condition status post lumbar epidural steroid injection performed on today's visit. 4. Surgical evaluation. Patient will follow-up with Dr. Dutch Quint 5. Neurological evaluation. 6. The patient may be a candidate for radiofrequency procedures, implantation device, and other treatment pending response to treatment and follow-up evaluation. 7. The patient has been advised to adhere to proper body mechanics and avoid activities which appear to aggravate condition. 8. The patient has been advised to call the [facility] prior to scheduled return appointment should there be significant change in condition or should there be sign  The patient is understanding and agrees with the suggested  treatment plan   Review of Systems     Objective:   Physical Exam        Assessment & Plan:

## 2014-10-29 NOTE — Patient Instructions (Addendum)
Continue present medications and antibiotic. Please obtain your antibiotic Ceftin today and begin taking antibiotic today  F/U PCP Sharee Pimple  for evaliation of  BP and general medical  condition.  F/U surgical evaluation as discussed with Dr. Dutch Quint  F/U neurological evaluation.  May consider radiofrequency rhizolysis or intraspinal procedures pending response to present treatment and F/U evaluation.  Patient to call Pain Management Center should patient have concerns prior to scheduled return appointment.  Pain Management Discharge Instructions  General Discharge Instructions :  If you need to reach your doctor call: Monday-Friday 8:00 am - 4:00 pm at [phone] or toll free [phone].  After clinic hours [phone] to have operator reach doctor.  Bring all of your medication bottles to all your appointments in the pain clinic.  To cancel or reschedule your appointment with Pain Management please remember to call 24 hours in advance to avoid a fee.  Refer to the educational materials which you have been given on: General Risks, I had my Procedure. Discharge Instructions, Post Sedation.  Post Procedure Instructions:  The drugs you were given will stay in your system until tomorrow, so for the next 24 hours you should not drive, make any legal decisions or drink any alcoholic beverages.  You may eat anything you prefer, but it is better to start with liquids then soups and crackers, and gradually work up to solid foods.  Please notify your doctor immediately if you have any unusual bleeding, trouble breathing or pain that is not related to your normal pain.  Depending on the type of procedure that was done, some parts of your body may feel week and/or numb.  This usually clears up by tonight or the next day.  Walk with the use of an assistive device or accompanied by an adult for the 24 hours.  You may use ice on the affected area for the first 24 hours.  Put ice in a  Ziploc bag and cover with a towel and place against area 15 minutes on 15 minutes off.  You may switch to heat after 24 hours.Epidural Steroid Injection Patient Information  Description: The epidural space surrounds the nerves as they exit the spinal cord.  In some patients, the nerves can be compressed and inflamed by a bulging disc or a tight spinal canal (spinal stenosis).  By injecting steroids into the epidural space, we can bring irritated nerves into direct contact with a potentially helpful medication.  These steroids act directly on the irritated nerves and can reduce swelling and inflammation which often leads to decreased pain.  Epidural steroids may be injected anywhere along the spine and from the neck to the low back depending upon the location of your pain.   After numbing the skin with local anesthetic (like Novocaine), a small needle is passed into the epidural space slowly.  You may experience a sensation of pressure while this is being done.  The entire block usually last less than 10 minutes.  Conditions which may be treated by epidural steroids:   Low back and leg pain  Neck and arm pain  Spinal stenosis  Post-laminectomy syndrome  Herpes zoster (shingles) pain  Pain from compression fractures  Preparation for the injection:  1. Do not eat any solid food or dairy products within 6 hours of your appointment.  2. You may drink clear liquids up to 2 hours before appointment.  Clear liquids include water, black coffee, juice or soda.  No milk or cream please. 3. You may  take your regular medication, including pain medications, with a sip of water before your appointment  Diabetics should hold regular insulin (if taken separately) and take 1/2 normal NPH dos the morning of the procedure.  Carry some sugar containing items with you to your appointment. 4. A driver must accompany you and be prepared to drive you home after your procedure.  5. Bring all your current  medications with your. 6. An IV may be inserted and sedation may be given at the discretion of the physician.   7. A blood pressure cuff, EKG and other monitors will often be applied during the procedure.  Some patients may need to have extra oxygen administered for a short period. 8. You will be asked to provide medical information, including your allergies, prior to the procedure.  We must know immediately if you are taking blood thinners (like Coumadin/Warfarin)  Or if you are allergic to IV iodine contrast (dye). We must know if you could possible be pregnant.  Possible side-effects:  Bleeding from needle site  Infection (rare, may require surgery)  Nerve injury (rare)  Numbness & tingling (temporary)  Difficulty urinating (rare, temporary)  Spinal headache ( a headache worse with upright posture)  Light -headedness (temporary)  Pain at injection site (several days)  Decreased blood pressure (temporary)  Weakness in arm/leg (temporary)  Pressure sensation in back/neck (temporary)  Call if you experience:  Fever/chills associated with headache or increased back/neck pain.  Headache worsened by an upright position.  New onset weakness or numbness of an extremity below the injection site  Hives or difficulty breathing (go to the emergency room)  Inflammation or drainage at the infection site  Severe back/neck pain  Any new symptoms which are concerning to you  Please note:  Although the local anesthetic injected can often make your back or neck feel good for several hours after the injection, the pain will likely return.  It takes 3-7 days for steroids to work in the epidural space.  You may not notice any pain relief for at least that one week.  If effective, we will often do a series of three injections spaced 3-6 weeks apart to maximally decrease your pain.  After the initial series, we generally will wait several months before considering a repeat injection of  the same type.  If you have any questions, please call [phone] [facility]

## 2014-10-30 ENCOUNTER — Telehealth: Payer: Self-pay | Admitting: *Deleted

## 2014-10-30 NOTE — Telephone Encounter (Signed)
LVM

## 2014-12-02 ENCOUNTER — Ambulatory Visit: Payer: Medicare Other | Admitting: Pain Medicine

## 2014-12-30 ENCOUNTER — Ambulatory Visit: Payer: Medicare Other | Admitting: Pain Medicine

## 2015-02-24 ENCOUNTER — Encounter: Payer: Self-pay | Admitting: Pain Medicine

## 2015-02-24 ENCOUNTER — Ambulatory Visit: Payer: Medicare Other | Attending: Pain Medicine | Admitting: Pain Medicine

## 2015-02-24 VITALS — BP 140/101 | HR 89 | Temp 98.0°F | Resp 18 | Ht 66.0 in | Wt >= 6400 oz

## 2015-02-24 DIAGNOSIS — M5000 Cervical disc disorder with myelopathy, unspecified cervical region: Secondary | ICD-10-CM

## 2015-02-24 DIAGNOSIS — M5020 Other cervical disc displacement, unspecified cervical region: Secondary | ICD-10-CM

## 2015-02-24 DIAGNOSIS — M5030 Other cervical disc degeneration, unspecified cervical region: Secondary | ICD-10-CM

## 2015-02-24 DIAGNOSIS — Z9889 Other specified postprocedural states: Secondary | ICD-10-CM | POA: Diagnosis not present

## 2015-02-24 DIAGNOSIS — M5116 Intervertebral disc disorders with radiculopathy, lumbar region: Secondary | ICD-10-CM | POA: Insufficient documentation

## 2015-02-24 DIAGNOSIS — M79602 Pain in left arm: Secondary | ICD-10-CM | POA: Diagnosis present

## 2015-02-24 DIAGNOSIS — M47816 Spondylosis without myelopathy or radiculopathy, lumbar region: Secondary | ICD-10-CM

## 2015-02-24 DIAGNOSIS — M47896 Other spondylosis, lumbar region: Secondary | ICD-10-CM | POA: Diagnosis not present

## 2015-02-24 DIAGNOSIS — M542 Cervicalgia: Secondary | ICD-10-CM | POA: Diagnosis present

## 2015-02-24 DIAGNOSIS — M5481 Occipital neuralgia: Secondary | ICD-10-CM | POA: Insufficient documentation

## 2015-02-24 DIAGNOSIS — M5126 Other intervertebral disc displacement, lumbar region: Secondary | ICD-10-CM | POA: Diagnosis not present

## 2015-02-24 DIAGNOSIS — M79601 Pain in right arm: Secondary | ICD-10-CM | POA: Diagnosis present

## 2015-02-24 DIAGNOSIS — M5416 Radiculopathy, lumbar region: Secondary | ICD-10-CM

## 2015-02-24 NOTE — Patient Instructions (Addendum)
Continue present medication  F/U PCP Sharee Pimple or other PCP at facility for evaliation of  BP and general medical  condition. Patient has appointment scheduled for evaluation of blood pressure this time  F/U surgical evaluation. Neurosurgical reevaluation with Dr. Dutch Quint as discussed  F/U neurological evaluation  May consider radiofrequency rhizolysis or intraspinal procedures pending response to present treatment and F/U evaluation.  Patient to call Pain Management Center should patient have concerns prior to scheduled return appointment.Pain Management Discharge Instructions  General Discharge Instructions :  If you need to reach your doctor call: Monday-Friday 8:00 am - 4:00 pm at [phone] or toll free [phone].  After clinic hours [phone] to have operator reach doctor.  Bring all of your medication bottles to all your appointments in the pain clinic.  To cancel or reschedule your appointment with Pain Management please remember to call 24 hours in advance to avoid a fee.  Refer to the educational materials which you have been given on: General Risks, I had my Procedure. Discharge Instructions, Post Sedation.  Post Procedure Instructions:  The drugs you were given will stay in your system until tomorrow, so for the next 24 hours you should not drive, make any legal decisions or drink any alcoholic beverages.  You may eat anything you prefer, but it is better to start with liquids then soups and crackers, and gradually work up to solid foods.  Please notify your doctor immediately if you have any unusual bleeding, trouble breathing or pain that is not related to your normal pain.  Depending on the type of procedure that was done, some parts of your body may feel week and/or numb.  This usually clears up by tonight or the next day.  Walk with the use of an assistive device or accompanied by an adult for the 24 hours.  You may use ice on the affected area for the  first 24 hours.  Put ice in a Ziploc bag and cover with a towel and place against area 15 minutes on 15 minutes off.  You may switch to heat after 24 hours.Epidural Steroid Injection An epidural steroid injection is given to relieve pain in your neck, back, or legs that is caused by the irritation or swelling of a nerve root. This procedure involves injecting a steroid and numbing medicine (anesthetic) into the epidural space. The epidural space is the space between the outer covering of your spinal cord and the bones that form your backbone (vertebra).  LET [facility] CARE PROVIDER KNOW ABOUT:   Any allergies you have.  All medicines you are taking, including vitamins, herbs, eye drops, creams, and over-the-counter medicines such as aspirin.  Previous problems you or members of your family have had with the use of anesthetics.  Any blood disorders or blood clotting disorders you have.  Previous surgeries you have had.  Medical conditions you have. RISKS AND COMPLICATIONS Generally, this is a safe procedure. However, as with any procedure, complications can occur. Possible complications of epidural steroid injection include:  Headache.  Bleeding.  Infection.  Allergic reaction to the medicines.  Damage to your nerves. The response to this procedure depends on the underlying cause of the pain and its duration. People who have long-term (chronic) pain are less likely to benefit from epidural steroids than are those people whose pain comes on strong and suddenly. BEFORE THE PROCEDURE   Ask your health care provider about changing or stopping your regular medicines. You may be advised to stop taking  blood-thinning medicines a few days before the procedure.  You may be given medicines to reduce anxiety.  Arrange for someone to take you home after the procedure. PROCEDURE   You will remain awake during the procedure. You may receive medicine to make you relaxed.  You will be asked  to lie on your stomach.  The injection site will be cleaned.  The injection site will be numbed with a medicine (local anesthetic).  A needle will be injected through your skin into the epidural space.  Your health care provider will use an X-ray machine to ensure that the steroid is delivered closest to the affected nerve. You may have minimal discomfort at this time.  Once the needle is in the right position, the local anesthetic and the steroid will be injected into the epidural space.  The needle will then be removed and a bandage will be applied to the injection site. AFTER THE PROCEDURE   You may be monitored for a short time before you go home.  You may feel weakness or numbness in your arm or leg, which disappears within hours.  You may be allowed to eat, drink, and take your regular medicine.  You may have soreness at the site of the injection.   This information is not intended to replace advice given to you by your health care provider. Make sure you discuss any questions you have with your health care provider.   Document Released: 06/28/2007 Document Revised: 11/21/2012 Document Reviewed: 09/07/2012 Elsevier Interactive Patient Education [employer]2016 [employer]

## 2015-02-24 NOTE — Progress Notes (Signed)
Subjective:    Patient ID: Tiffany Gomez, female    DOB: June 28, 1965, 49 y.o.   MRN: [id number]  HPI  The patient is a 49 year old female who returns to pain management for further evaluation and treatment of pain involving the neck upper extremity regions as well as the lower back and lower extremity regions. Patient is undergone prior interventional treatment in pain management Center with significant improvement of pain involving the neck upper extremity regions and the lower back and lower extremity regions. The patient returned at this time and states that she has return of her lower back and lower extremity pain with pain radiating to the lower extremities with standing and walking. Patient is undergone evaluation with Dr. Dutch QuintPoole recommend patient continue treatment in pain management Center at this time. We also would need permission for prescribing medications for treatment of patient's pain and we will request such as well. We will proceed with interventional treatment at time return appointment consisting of lumbar epidural steroid injection in attempt to decrease severity of patient's symptoms, minimize progression of symptoms, and avoid need for more involved treatment. The patient agreed to suggested treatment plan.   Review of Systems     Objective:   Physical Exam  There was tenderness to palpation over the paraspinal musculature region of the cervical region cervical facet region. There was tenderness over the splenius capitis and a separate talus musculature regions. Palpation of the acromioclavicular and glenohumeral joint regions reproduces minimal discomfort. The patient appeared to be with bilaterally equal grip strength. Tinel and Phalen's maneuver were without increased pain of significant degree There was tenderness over the thoracic facet thoracic paraspinal musculature region OF moderate degree. Palpation over the lumbar paraspinal musculature and lumbar facet region  was with moderate to moderately severe discomfort. Lateral bending rotation extension and palpation of the lumbar facets reproduced moderately severe discomfort. Straight leg raising was limited to approximately 20 without increased pain with dorsiflexion noted. There was negative clonus negative Homans. DTRs were difficult to elicit. Patient had difficulty attempted to stand on tiptoes and heels. Palpation of the PSIS and PII S regions reproduced pain of moderate degree. No definite sensory deficit or dermatomal distribution was detected. Reevaluation was was questionably decreased sensation of the L5 dermatomal distribution. The abdomen was nontender with no costovertebral tenderness noted           Assessment & Plan:  Degenerative disc disease lumbar spine Mild to moderate multilevel degenerative changes with facet arthrosis worse at L5-S1 without significant spinal canal of neural foraminal stenosis mild asymmetric enlargement of the right L5 nerve root which could reflect a radiculitis, L5-S1 mild bulging disc with symptoms small central disc protrusion. L4-5 facet arthrosis. L3-4 disc bulging with facet arthropathy. L2-3 left foraminal/extra foraminal disc protrusion slightly displaces the exiting L2 nerve root. L1-2 shallow left foraminal/extra foraminal disc protrusion  Lumbar radiculopathy  Lumbar facet syndrome  Degenerative disc disease cervical spine Status post surgery cervical region  Bilateral occipital neuralgia       PLAN   Continue present medication  Lumbar epidural steroid injection to be performed at time of return appointment   F/U PCP [name] or other PCP at facility for evaliation of  BP and general medical  condition. Patient has appointment scheduled for evaluation of blood pressure this time  F/U surgical evaluation. Neurosurgical reevaluation with Dr. Dutch QuintPoole as discussed  F/U neurological evaluation  May consider radiofrequency rhizolysis or  intraspinal procedures pending response to present treatment and  F/U evaluation.  Patient to call Pain Management Center should patient have concerns prior to scheduled return appointment

## 2015-02-24 NOTE — Progress Notes (Signed)
Safety precautions to be maintained throughout the outpatient stay will include: orient to surroundings, keep bed in low position, maintain call bell within reach at all times, provide assistance with transfer out of bed and ambulation.  

## 2015-03-02 ENCOUNTER — Ambulatory Visit: Payer: Medicare Other | Attending: Otolaryngology

## 2015-03-02 DIAGNOSIS — M5489 Other dorsalgia: Secondary | ICD-10-CM | POA: Insufficient documentation

## 2015-03-02 DIAGNOSIS — E785 Hyperlipidemia, unspecified: Secondary | ICD-10-CM | POA: Diagnosis not present

## 2015-03-02 DIAGNOSIS — F329 Major depressive disorder, single episode, unspecified: Secondary | ICD-10-CM | POA: Diagnosis not present

## 2015-03-02 DIAGNOSIS — M940 Chondrocostal junction syndrome [Tietze]: Secondary | ICD-10-CM | POA: Insufficient documentation

## 2015-03-02 DIAGNOSIS — J4541 Moderate persistent asthma with (acute) exacerbation: Secondary | ICD-10-CM | POA: Insufficient documentation

## 2015-03-02 DIAGNOSIS — E119 Type 2 diabetes mellitus without complications: Secondary | ICD-10-CM | POA: Insufficient documentation

## 2015-03-02 DIAGNOSIS — E669 Obesity, unspecified: Secondary | ICD-10-CM | POA: Diagnosis not present

## 2015-03-02 DIAGNOSIS — G4733 Obstructive sleep apnea (adult) (pediatric): Secondary | ICD-10-CM | POA: Diagnosis present

## 2015-03-02 DIAGNOSIS — I10 Essential (primary) hypertension: Secondary | ICD-10-CM | POA: Diagnosis not present

## 2015-03-02 DIAGNOSIS — R635 Abnormal weight gain: Secondary | ICD-10-CM | POA: Diagnosis present

## 2015-03-11 ENCOUNTER — Ambulatory Visit: Payer: Medicare Other | Attending: Pain Medicine | Admitting: Pain Medicine

## 2015-03-11 ENCOUNTER — Encounter: Payer: Self-pay | Admitting: Pain Medicine

## 2015-03-11 DIAGNOSIS — M47816 Spondylosis without myelopathy or radiculopathy, lumbar region: Secondary | ICD-10-CM | POA: Diagnosis not present

## 2015-03-11 DIAGNOSIS — M545 Low back pain: Secondary | ICD-10-CM | POA: Diagnosis present

## 2015-03-11 DIAGNOSIS — M5416 Radiculopathy, lumbar region: Secondary | ICD-10-CM

## 2015-03-11 DIAGNOSIS — M5136 Other intervertebral disc degeneration, lumbar region: Secondary | ICD-10-CM | POA: Insufficient documentation

## 2015-03-11 DIAGNOSIS — M5126 Other intervertebral disc displacement, lumbar region: Secondary | ICD-10-CM | POA: Diagnosis not present

## 2015-03-11 DIAGNOSIS — M5030 Other cervical disc degeneration, unspecified cervical region: Secondary | ICD-10-CM

## 2015-03-11 DIAGNOSIS — M5000 Cervical disc disorder with myelopathy, unspecified cervical region: Secondary | ICD-10-CM

## 2015-03-11 DIAGNOSIS — M79605 Pain in left leg: Secondary | ICD-10-CM | POA: Diagnosis present

## 2015-03-11 DIAGNOSIS — M5020 Other cervical disc displacement, unspecified cervical region: Secondary | ICD-10-CM

## 2015-03-11 DIAGNOSIS — M79604 Pain in right leg: Secondary | ICD-10-CM | POA: Diagnosis present

## 2015-03-11 MED ORDER — LACTATED RINGERS IV SOLN: 1000.0000 mL | INTRAVENOUS | Status: DC

## 2015-03-11 MED ORDER — ORPHENADRINE CITRATE 30 MG/ML IJ SOLN
INTRAMUSCULAR | Status: AC
  Filled 2015-03-11: qty 2

## 2015-03-11 MED ORDER — LIDOCAINE HCL (PF) 1 % IJ SOLN: 10.0000 mL | Freq: Once | INTRAMUSCULAR | Status: DC

## 2015-03-11 MED ORDER — MIDAZOLAM HCL 5 MG/5ML IJ SOLN
INTRAMUSCULAR | Status: AC
  Filled 2015-03-11: qty 5

## 2015-03-11 MED ORDER — OXYCODONE HCL 10 MG PO TABS: ORAL_TABLET | ORAL | Status: DC

## 2015-03-11 MED ORDER — TRIAMCINOLONE ACETONIDE 40 MG/ML IJ SUSP: 40.0000 mg | Freq: Once | INTRAMUSCULAR | Status: DC

## 2015-03-11 MED ORDER — BUPIVACAINE HCL (PF) 0.25 % IJ SOLN
INTRAMUSCULAR | Status: AC
  Filled 2015-03-11: qty 30

## 2015-03-11 MED ORDER — CEFAZOLIN SODIUM 1-5 GM-% IV SOLN: 1.0000 g | Freq: Once | INTRAVENOUS | Status: DC

## 2015-03-11 MED ORDER — LIDOCAINE HCL (PF) 1 % IJ SOLN
INTRAMUSCULAR | Status: AC
  Administered 2015-03-11: 13:00:00
  Filled 2015-03-11: qty 10

## 2015-03-11 MED ORDER — SODIUM CHLORIDE 0.9 % IJ SOLN: 20.0000 mL | Freq: Once | INTRAMUSCULAR | Status: DC

## 2015-03-11 MED ORDER — SODIUM CHLORIDE 0.9 % IJ SOLN
INTRAMUSCULAR | Status: AC
  Administered 2015-03-11: 13:00:00
  Filled 2015-03-11: qty 20

## 2015-03-11 MED ORDER — TRIAMCINOLONE ACETONIDE 40 MG/ML IJ SUSP
INTRAMUSCULAR | Status: AC
  Administered 2015-03-11: 13:00:00
  Filled 2015-03-11: qty 1

## 2015-03-11 MED ORDER — CEFAZOLIN SODIUM 1 G IJ SOLR
INTRAMUSCULAR | Status: AC
  Administered 2015-03-11: 1 g via INTRAVENOUS
  Filled 2015-03-11: qty 10

## 2015-03-11 MED ORDER — FENTANYL CITRATE (PF) 100 MCG/2ML IJ SOLN
INTRAMUSCULAR | Status: AC
  Filled 2015-03-11: qty 2

## 2015-03-11 NOTE — Patient Instructions (Addendum)
Continue present medications and antibiotic. Please obtain your antibiotic Ceftin today and begin taking antibiotic today  F/U PCP [name]  for evaliation of  BP and general medical  condition.  F/U surgical evaluation as discussed with Dr. Dutch QuintPoole  F/U neurological evaluation.  May consider radiofrequency rhizolysis or intraspinal procedures pending response to present treatment and F/U evaluation.  Patient to call Pain Management Center should patient have concerns prior to scheduled return appointment. Pain Management Discharge Instructions  General Discharge Instructions :  If you need to reach your doctor call: Monday-Friday 8:00 am - 4:00 pm at [phone] or toll free [phone].  After clinic hours [phone] to have operator reach doctor.  Bring all of your medication bottles to all your appointments in the pain clinic.  To cancel or reschedule your appointment with Pain Management please remember to call 24 hours in advance to avoid a fee.  Refer to the educational materials which you have been given on: General Risks, I had my Procedure. Discharge Instructions, Post Sedation.  Post Procedure Instructions:  The drugs you were given will stay in your system until tomorrow, so for the next 24 hours you should not drive, make any legal decisions or drink any alcoholic beverages.  You may eat anything you prefer, but it is better to start with liquids then soups and crackers, and gradually work up to solid foods.  Please notify your doctor immediately if you have any unusual bleeding, trouble breathing or pain that is not related to your normal pain.  Depending on the type of procedure that was done, some parts of your body may feel week and/or numb.  This usually clears up by tonight or the next day.  Walk with the use of an assistive device or accompanied by an adult for the 24 hours.  You may use ice on the affected area for the first 24 hours.  Put ice in a  Ziploc bag and cover with a towel and place against area 15 minutes on 15 minutes off.  You may switch to heat after 24 hours.

## 2015-03-11 NOTE — Progress Notes (Signed)
   Subjective:    Patient ID: Tiffany Gomez, female    DOB: 12/15/1965, 49 y.o.   MRN: [id number]  HPI    Review of Systems     Objective:   Physical Exam        Assessment & Plan:

## 2015-03-11 NOTE — Progress Notes (Signed)
   Subjective:    Patient ID: Tiffany Gomez, female    DOB: April 06, 1965, 49 y.o.   MRN: [id number]  HPI  PROCEDURE PERFORMED: Lumbar epidural steroid injection   NOTE: The patient is a 49 y.o. female who returns to Pain Management Center for further evaluation and treatment of pain involving the lumbar and lower extremity region. MRI revealed the patient to be with Degenerative disc disease lumbar spine Mild to moderate multilevel degenerative changes with facet arthrosis worse at L5-S1 without significant spinal canal of neural foraminal stenosis mild asymmetric enlargement of the right L5 nerve root which could reflect a radiculitis, L5-S1 mild bulging disc with symptoms small central disc protrusion. L4-5 facet arthrosis. L3-4 disc bulging with facet arthropathy. L2-3 left foraminal/extra foraminal disc protrusion slightly displaces the exiting L2 nerve root. L1-2 shallow left foraminal/extra foraminal disc protrusion there is concern regarding patient being with evidence of lumbar stenosis and lumbar radiculopathy contributing to patient's symptomatology. The risks, benefits, and expectations of the procedure have been discussed and explained to the patient who was understanding and in agreement with suggested treatment plan. We will proceed with lumbar epidural steroid injection as discussed and as explained to the patient who is willing to proceed with procedure as planned.   DESCRIPTION OF PROCEDURE: Lumbar epidural steroid injection with  EKG, blood pressure, pulse, and pulse oximetry monitoring. The procedure was performed with the patient in the prone position under fluoroscopic guidance. A local anesthetic skin wheal of 1.5% plain lidocaine was accomplished at proposed entry site. An 18-gauge Tuohy epidural needle was inserted at the L 4 vertebral body level right of the midline via loss-of-resistance technique with negative heme and negative CSF return. A total of 4 mL of Preservative-Free  normal saline with 40 mg of Kenalog injected incrementally via epidurally placed needle. Needle was removed.    A total of 40 mg of Kenalog was utilized for the procedure.   The patient tolerated the injection well.    PLAN:   1. Medications: We will continue presently prescribed medication oxycodone 2. Will consider modification of treatment regimen pending response to treatment rendered on today's visit and follow-up evaluation. 3. The patient is to follow-up with primary care physician White regarding blood pressure and general medical condition status post lumbar epidural steroid injection performed on today's visit. 4. Surgical evaluation.. Patient will follow-up with Dr. Dutch QuintPoole as discussed 5. Neurological evaluation. May consider [facility] EMG studies 6. The patient may be a candidate for radiofrequency procedures, implantation device, and other treatment pending response to treatment and follow-up evaluation. 7. The patient has been advised to adhere to proper body mechanics and avoid activities which appear to aggravate condition. 8. The patient has been advised to call the Pain Management Center prior to scheduled return appointment should there be significant change in condition or should there be sign  The patient is understanding and agrees with the suggested  treatment plan   Review of Systems     Objective:   Physical Exam        Assessment & Plan:

## 2015-03-12 ENCOUNTER — Telehealth: Payer: Self-pay | Admitting: *Deleted

## 2015-03-12 NOTE — Telephone Encounter (Signed)
Message left

## 2015-04-09 ENCOUNTER — Encounter: Payer: Self-pay | Admitting: Pain Medicine

## 2015-04-09 ENCOUNTER — Ambulatory Visit: Payer: Medicare Other | Attending: Pain Medicine | Admitting: Pain Medicine

## 2015-04-09 VITALS — BP 109/61 | HR 103 | Temp 96.2°F | Resp 18 | Ht 67.0 in | Wt 395.0 lb

## 2015-04-09 DIAGNOSIS — M4806 Spinal stenosis, lumbar region: Secondary | ICD-10-CM | POA: Insufficient documentation

## 2015-04-09 DIAGNOSIS — M5020 Other cervical disc displacement, unspecified cervical region: Secondary | ICD-10-CM

## 2015-04-09 DIAGNOSIS — Z9889 Other specified postprocedural states: Secondary | ICD-10-CM | POA: Diagnosis not present

## 2015-04-09 DIAGNOSIS — M5126 Other intervertebral disc displacement, lumbar region: Secondary | ICD-10-CM | POA: Insufficient documentation

## 2015-04-09 DIAGNOSIS — M5030 Other cervical disc degeneration, unspecified cervical region: Secondary | ICD-10-CM

## 2015-04-09 DIAGNOSIS — M5000 Cervical disc disorder with myelopathy, unspecified cervical region: Secondary | ICD-10-CM

## 2015-04-09 DIAGNOSIS — M545 Low back pain: Secondary | ICD-10-CM | POA: Diagnosis present

## 2015-04-09 DIAGNOSIS — M47896 Other spondylosis, lumbar region: Secondary | ICD-10-CM | POA: Insufficient documentation

## 2015-04-09 DIAGNOSIS — M5416 Radiculopathy, lumbar region: Secondary | ICD-10-CM

## 2015-04-09 DIAGNOSIS — M542 Cervicalgia: Secondary | ICD-10-CM | POA: Diagnosis present

## 2015-04-09 DIAGNOSIS — M5116 Intervertebral disc disorders with radiculopathy, lumbar region: Secondary | ICD-10-CM | POA: Diagnosis not present

## 2015-04-09 DIAGNOSIS — M47816 Spondylosis without myelopathy or radiculopathy, lumbar region: Secondary | ICD-10-CM

## 2015-04-09 MED ORDER — OXYCODONE HCL 10 MG PO TABS: ORAL_TABLET | ORAL | Status: DC

## 2015-04-09 NOTE — Patient Instructions (Addendum)
Continue present medication oxycodone  Lumbar epidural steroid injection to be performed at return appointment  F/U PCP [name] or other PCP at facility for evaliation of  BP and general medical  condition.   F/U surgical evaluation. Neurosurgical reevaluation with Dr. Dutch QuintPoole as discussed  F/U neurological evaluation  May consider radiofrequency rhizolysis or intraspinal procedures pending response to present treatment and F/U evaluation.  Patient to call Pain Management Center should patient have concerns prior to scheduled return appointmentEpidural Steroid Injection Patient Information  Description: The epidural space surrounds the nerves as they exit the spinal cord.  In some patients, the nerves can be compressed and inflamed by a bulging disc or a tight spinal canal (spinal stenosis).  By injecting steroids into the epidural space, we can bring irritated nerves into direct contact with a potentially helpful medication.  These steroids act directly on the irritated nerves and can reduce swelling and inflammation which often leads to decreased pain.  Epidural steroids may be injected anywhere along the spine and from the neck to the low back depending upon the location of your pain.   After numbing the skin with local anesthetic (like Novocaine), a small needle is passed into the epidural space slowly.  You may experience a sensation of pressure while this is being done.  The entire block usually last less than 10 minutes.  Conditions which may be treated by epidural steroids:   Low back and leg pain  Neck and arm pain  Spinal stenosis  Post-laminectomy syndrome  Herpes zoster (shingles) pain  Pain from compression fractures  Preparation for the injection:  1. Do not eat any solid food or dairy products within 6 hours of your appointment.  2. You may drink clear liquids up to 2 hours before appointment.  Clear liquids include water, black coffee, juice or soda.  No milk  or cream please. 3. You may take your regular medication, including pain medications, with a sip of water before your appointment  Diabetics should hold regular insulin (if taken separately) and take 1/2 normal NPH dos the morning of the procedure.  Carry some sugar containing items with you to your appointment. 4. A driver must accompany you and be prepared to drive you home after your procedure.  5. Bring all your current medications with your. 6. An IV may be inserted and sedation may be given at the discretion of the physician.   7. A blood pressure cuff, EKG and other monitors will often be applied during the procedure.  Some patients may need to have extra oxygen administered for a short period. 8. You will be asked to provide medical information, including your allergies, prior to the procedure.  We must know immediately if you are taking blood thinners (like Coumadin/Warfarin)  Or if you are allergic to IV iodine contrast (dye). We must know if you could possible be pregnant.  Possible side-effects:  Bleeding from needle site  Infection (rare, may require surgery)  Nerve injury (rare)  Numbness & tingling (temporary)  Difficulty urinating (rare, temporary)  Spinal headache ( a headache worse with upright posture)  Light -headedness (temporary)  Pain at injection site (several days)  Decreased blood pressure (temporary)  Weakness in arm/leg (temporary)  Pressure sensation in back/neck (temporary)  Call if you experience:  Fever/chills associated with headache or increased back/neck pain.  Headache worsened by an upright position.  New onset weakness or numbness of an extremity below the injection site  Hives or difficulty breathing (go  to the emergency room)  Inflammation or drainage at the infection site  Severe back/neck pain  Any new symptoms which are concerning to you  Please note:  Although the local anesthetic injected can often make your back or neck  feel good for several hours after the injection, the pain will likely return.  It takes 3-7 days for steroids to work in the epidural space.  You may not notice any pain relief for at least that one week.  If effective, we will often do a series of three injections spaced 3-6 weeks apart to maximally decrease your pain.  After the initial series, we generally will wait several months before considering a repeat injection of the same type.  If you have any questions, please call [phone] [facility] ClinicGENERAL RISKS AND COMPLICATIONS  What are the risk, side effects and possible complications? Generally speaking, most procedures are safe.  However, with any procedure there are risks, side effects, and the possibility of complications.  The risks and complications are dependent upon the sites that are lesioned, or the type of nerve block to be performed.  The closer the procedure is to the spine, the more serious the risks are.  Great care is taken when placing the radio frequency needles, block needles or lesioning probes, but sometimes complications can occur. 1. Infection: Any time there is an injection through the skin, there is a risk of infection.  This is why sterile conditions are used for these blocks.  There are four possible types of infection. 1. Localized skin infection. 2. Central Nervous System Infection-This can be in the form of Meningitis, which can be deadly. 3. Epidural Infections-This can be in the form of an epidural abscess, which can cause pressure inside of the spine, causing compression of the spinal cord with subsequent paralysis. This would require an emergency surgery to decompress, and there are no guarantees that the patient would recover from the paralysis. 4. Discitis-This is an infection of the intervertebral discs.  It occurs in about 1% of discography procedures.  It is difficult to treat and it may lead to surgery.        2. Pain:  the needles have to go through skin and soft tissues, will cause soreness.       3. Damage to internal structures:  The nerves to be lesioned may be near blood vessels or    other nerves which can be potentially damaged.       4. Bleeding: Bleeding is more common if the patient is taking blood thinners such as  aspirin, Coumadin, Ticiid, Plavix, etc., or if he/she have some genetic predisposition  such as hemophilia. Bleeding into the spinal canal can cause compression of the spinal  cord with subsequent paralysis.  This would require an emergency surgery to  decompress and there are no guarantees that the patient would recover from the  paralysis.       5. Pneumothorax:  Puncturing of a lung is a possibility, every time a needle is introduced in  the area of the chest or upper back.  Pneumothorax refers to free air around the  collapsed lung(s), inside of the thoracic cavity (chest cavity).  Another two possible  complications related to a similar event would include: Hemothorax and Chylothorax.   These are variations of the Pneumothorax, where instead of air around the collapsed  lung(s), you may have blood or chyle, respectively.       6. Spinal headaches:  They may occur with any procedures in the area of the spine.       7. Persistent CSF (Cerebro-Spinal Fluid) leakage: This is a rare problem, but may occur  with prolonged intrathecal or epidural catheters either due to the formation of a fistulous  track or a dural tear.       8. Nerve damage: By working so close to the spinal cord, there is always a possibility of  nerve damage, which could be as serious as a permanent spinal cord injury with  paralysis.       9. Death:  Although rare, severe deadly allergic reactions known as "Anaphylactic  reaction" can occur to any of the medications used.      10. Worsening of the symptoms:  We can always make thing worse.  What are the chances of something like this happening? Chances of any of this occuring  are extremely low.  By statistics, you have more of a chance of getting killed in a motor vehicle accident: while driving to the hospital than any of the above occurring .  Nevertheless, you should be aware that they are possibilities.  In general, it is similar to taking a shower.  Everybody knows that you can slip, hit your head and get killed.  Does that mean that you should not shower again?  Nevertheless always keep in mind that statistics do not mean anything if you happen to be on the wrong side of them.  Even if a procedure has a 1 (one) in a 1,000,000 (million) chance of going wrong, it you happen to be that one..Also, keep in mind that by statistics, you have more of a chance of having something go wrong when taking medications.  Who should not have this procedure? If you are on a blood thinning medication (e.g. Coumadin, Plavix, see list of "Blood Thinners"), or if you have an active infection going on, you should not have the procedure.  If you are taking any blood thinners, please inform your physician.  How should I prepare for this procedure?  Do not eat or drink anything at least six hours prior to the procedure.  Bring a driver with you .  It cannot be a taxi.  Come accompanied by an adult that can drive you back, and that is strong enough to help you if your legs get weak or numb from the local anesthetic.  Take all of your medicines the morning of the procedure with just enough water to swallow them.  If you have diabetes, make sure that you are scheduled to have your procedure done first thing in the morning, whenever possible.  If you have diabetes, take only half of your insulin dose and notify our nurse that you have done so as soon as you arrive at the clinic.  If you are diabetic, but only take blood sugar pills (oral hypoglycemic), then do not take them on the morning of your procedure.  You may take them after you have had the procedure.  Do not take aspirin or any  aspirin-containing medications, at least eleven (11) days prior to the procedure.  They may prolong bleeding.  Wear loose fitting clothing that may be easy to take off and that you would not mind if it got stained with Betadine or blood.  Do not wear any jewelry or perfume  Remove any nail coloring.  It will interfere with some of our monitoring equipment.  NOTE: Remember that this is not meant to  be interpreted as a complete list of all possible complications.  Unforeseen problems may occur.  BLOOD THINNERS The following drugs contain aspirin or other products, which can cause increased bleeding during surgery and should not be taken for 2 weeks prior to and 1 week after surgery.  If you should need take something for relief of minor pain, you may take acetaminophen which is found in Tylenol,m Datril, Anacin-3 and Panadol. It is not blood thinner. The products listed below are.  Do not take any of the products listed below in addition to any listed on your instruction sheet.  A.P.C or A.P.C with Codeine Codeine Phosphate Capsules #3 Ibuprofen Ridaura  ABC compound Congesprin Imuran rimadil  Advil Cope Indocin Robaxisal  Alka-Seltzer Effervescent Pain Reliever and Antacid Coricidin or Coricidin-D  Indomethacin Rufen  Alka-Seltzer plus Cold Medicine Cosprin Ketoprofen S-A-C Tablets  Anacin Analgesic Tablets or Capsules Coumadin Korlgesic Salflex  Anacin Extra Strength Analgesic tablets or capsules CP-2 Tablets Lanoril Salicylate  Anaprox Cuprimine Capsules Levenox Salocol  Anexsia-D Dalteparin Magan Salsalate  Anodynos Darvon compound Magnesium Salicylate Sine-off  Ansaid Dasin Capsules Magsal Sodium Salicylate  Anturane Depen Capsules Marnal Soma  APF Arthritis pain formula Dewitt's Pills Measurin Stanback  Argesic Dia-Gesic Meclofenamic Sulfinpyrazone  Arthritis Bayer Timed Release Aspirin Diclofenac Meclomen Sulindac  Arthritis pain formula Anacin Dicumarol Medipren Supac  Analgesic  (Safety coated) Arthralgen Diffunasal Mefanamic Suprofen  Arthritis Strength Bufferin Dihydrocodeine Mepro Compound Suprol  Arthropan liquid Dopirydamole Methcarbomol with Aspirin Synalgos  ASA tablets/Enseals Disalcid Micrainin Tagament  Ascriptin Doan's Midol Talwin  Ascriptin A/D Dolene Mobidin Tanderil  Ascriptin Extra Strength Dolobid Moblgesic Ticlid  Ascriptin with Codeine Doloprin or Doloprin with Codeine Momentum Tolectin  Asperbuf Duoprin Mono-gesic Trendar  Aspergum Duradyne Motrin or Motrin IB Triminicin  Aspirin plain, buffered or enteric coated Durasal Myochrisine Trigesic  Aspirin Suppositories Easprin Nalfon Trillsate  Aspirin with Codeine Ecotrin Regular or Extra Strength Naprosyn Uracel  Atromid-S Efficin Naproxen Ursinus  Auranofin Capsules Elmiron Neocylate Vanquish  Axotal Emagrin Norgesic Verin  Azathioprine Empirin or Empirin with Codeine Normiflo Vitamin E  Azolid Emprazil Nuprin Voltaren  Bayer Aspirin plain, buffered or children's or timed BC Tablets or powders Encaprin Orgaran Warfarin Sodium  Buff-a-Comp Enoxaparin Orudis Zorpin  Buff-a-Comp with Codeine Equegesic Os-Cal-Gesic   Buffaprin Excedrin plain, buffered or Extra Strength Oxalid   Bufferin Arthritis Strength Feldene Oxphenbutazone   Bufferin plain or Extra Strength Feldene Capsules Oxycodone with Aspirin   Bufferin with Codeine Fenoprofen Fenoprofen Pabalate or Pabalate-SF   Buffets II Flogesic Panagesic   Buffinol plain or Extra Strength Florinal or Florinal with Codeine Panwarfarin   Buf-Tabs Flurbiprofen Penicillamine   Butalbital Compound Four-way cold tablets Penicillin   Butazolidin Fragmin Pepto-Bismol   Carbenicillin Geminisyn Percodan   Carna Arthritis Reliever Geopen Persantine   Carprofen Gold's salt Persistin   Chloramphenicol Goody's Phenylbutazone   Chloromycetin Haltrain Piroxlcam   Clmetidine heparin Plaquenil   Cllnoril Hyco-pap Ponstel   Clofibrate Hydroxy chloroquine  Propoxyphen         Before stopping any of these medications, be sure to consult the physician who ordered them.  Some, such as Coumadin (Warfarin) are ordered to prevent or treat serious conditions such as "deep thrombosis", "pumonary embolisms", and other heart problems.  The amount of time that you may need off of the medication may also vary with the medication and the reason for which you were taking it.  If you are taking any of these medications, please make sure you notify your  pain physician before you undergo any procedures.         Pain Management Discharge Instructions  General Discharge Instructions :  If you need to reach your doctor call: Monday-Friday 8:00 am - 4:00 pm at [phone] or toll free [phone].  After clinic hours [phone] to have operator reach doctor.  Bring all of your medication bottles to all your appointments in the pain clinic.  To cancel or reschedule your appointment with Pain Management please remember to call 24 hours in advance to avoid a fee.  Refer to the educational materials which you have been given on: General Risks, I had my Procedure. Discharge Instructions, Post Sedation.  Post Procedure Instructions:  The drugs you were given will stay in your system until tomorrow, so for the next 24 hours you should not drive, make any legal decisions or drink any alcoholic beverages.  You may eat anything you prefer, but it is better to start with liquids then soups and crackers, and gradually work up to solid foods.  Please notify your doctor immediately if you have any unusual bleeding, trouble breathing or pain that is not related to your normal pain.  Depending on the type of procedure that was done, some parts of your body may feel week and/or numb.  This usually clears up by tonight or the next day.  Walk with the use of an assistive device or accompanied by an adult for the 24 hours.  You may use ice on the affected area for  the first 24 hours.  Put ice in a Ziploc bag and cover with a towel and place against area 15 minutes on 15 minutes off.  You may switch to heat after 24 hours.

## 2015-04-09 NOTE — Progress Notes (Signed)
Safety precautions to be maintained throughout the outpatient stay will include: orient to surroundings, keep bed in low position, maintain call bell within reach at all times, provide assistance with transfer out of bed and ambulation.  

## 2015-04-09 NOTE — Progress Notes (Signed)
   Subjective:    Patient ID: Tiffany Gomez, female    DOB: Dec 18, 1965, 50 y.o.   MRN: [id number]  HPI  The patient is a 50 year old female who returns to pain management for further evaluation and treatment of pain involving the neck upper extremity regions as well as lower back and lower extremity regions. The patient states that she has had significant benefit following previous procedure and at the present time she has return of pain of the lower back and lower extremity region. We've discussed patient undergoing neurosurgical reevaluation with Dr. Dutch QuintPoole and will consider patient for interventional treatment at time return appointment as discussed and as explained to patient on today's visit. The patient stated the lower back lower extremity pain became more intense as the day progresses. Patient denies trauma change in events of daily living because change in symptomatology. We discussed patient's condition and will proceed with interventional treatment at time return appointment of this time we will proceed with lumbar epidural steroid injection. Patient was understanding and agreed to suggested treatment plan      Review of Systems     Objective:   Physical Exam  There was tenderness to palpation of the splenius capitis and occipitalis musculature region a mild to moderate degree. There was questionably decreased grip strength with tenderness to palpation of the cervical facet cervical paraspinal musculature region as well as the thoracic facet thoracic paraspinal musculature regions. There appeared to be mild tenderness to palpation of the acromioclavicular and glenohumeral joint regions. The patient appeared to be with slightly decreased grip strength and Tinel and Phalen's maneuver were without increase of pain of significant degree. Palpation over the thoracic facet thoracic paraspinal musculature region was with moderate tends to palpation. There was no crepitus of the thoracic  region noted. Palpation over the lumbar paraspinal musculature region lumbar facet region was with moderate tenderness to palpation.. There was tenderness to palpation of the PSIS and PII S region. Straight leg raising was tolerated to 20 without increased pain with dorsiflexion noted. There was negative clonus negative Homans. Abdomen protuberant without excessive tends to palpation of vertebral tenderness was noted      Assessment & Plan:      Degenerative disc disease lumbar spine Mild to moderate multilevel degenerative changes with facet arthrosis worse at L5-S1 without significant spinal canal of neural foraminal stenosis mild asymmetric enlargement of the right L5 nerve root which could reflect a radiculitis, L5-S1 mild bulging disc with symptoms small central disc protrusion. L4-5 facet arthrosis. L3-4 disc bulging with facet arthropathy. L2-3 left foraminal/extra foraminal disc protrusion slightly displaces the exiting L2 nerve root. L1-2 shallow left foraminal/extra foraminal disc protrusion  Lumbar stenosis with neurogenic claudication  Lumbar radiculopathy  Lumbar facet syndrome  Degenerative disc disease cervical spine Status post surgery cervical region     Continue present medication oxycodone  Lumbar epidural steroid injection to be performed at return appointment  F/U PCP [name] or other PCP at facility for evaliation of  BP and general medical  condition.   F/U surgical evaluation. Neurosurgical reevaluation with Dr. Dutch QuintPoole as discussed  F/U neurological evaluation  May consider radiofrequency rhizolysis or intraspinal procedures pending response to present treatment and F/U evaluation.  Patient to call Pain Management Center should patient have concerns prior to scheduled return appointmentBilateral occipital neuralgia

## 2015-04-15 ENCOUNTER — Ambulatory Visit: Payer: Medicare Other | Attending: Pain Medicine | Admitting: Pain Medicine

## 2015-04-15 ENCOUNTER — Encounter: Payer: Self-pay | Admitting: Pain Medicine

## 2015-04-15 VITALS — BP 135/82 | HR 95 | Temp 98.2°F | Resp 18 | Wt 385.0 lb

## 2015-04-15 DIAGNOSIS — M79606 Pain in leg, unspecified: Secondary | ICD-10-CM | POA: Diagnosis present

## 2015-04-15 DIAGNOSIS — M5136 Other intervertebral disc degeneration, lumbar region: Secondary | ICD-10-CM | POA: Insufficient documentation

## 2015-04-15 DIAGNOSIS — M5000 Cervical disc disorder with myelopathy, unspecified cervical region: Secondary | ICD-10-CM

## 2015-04-15 DIAGNOSIS — M5126 Other intervertebral disc displacement, lumbar region: Secondary | ICD-10-CM | POA: Insufficient documentation

## 2015-04-15 DIAGNOSIS — M47816 Spondylosis without myelopathy or radiculopathy, lumbar region: Secondary | ICD-10-CM

## 2015-04-15 DIAGNOSIS — M5416 Radiculopathy, lumbar region: Secondary | ICD-10-CM

## 2015-04-15 DIAGNOSIS — M545 Low back pain: Secondary | ICD-10-CM | POA: Diagnosis present

## 2015-04-15 DIAGNOSIS — M5020 Other cervical disc displacement, unspecified cervical region: Secondary | ICD-10-CM

## 2015-04-15 DIAGNOSIS — M5030 Other cervical disc degeneration, unspecified cervical region: Secondary | ICD-10-CM

## 2015-04-15 MED ORDER — CEFAZOLIN SODIUM 1 G IJ SOLR
INTRAMUSCULAR | Status: AC
  Administered 2015-04-15: 1 g via INTRAVENOUS
  Filled 2015-04-15: qty 10

## 2015-04-15 MED ORDER — SODIUM CHLORIDE 0.9 % IJ SOLN
INTRAMUSCULAR | Status: AC
  Filled 2015-04-15: qty 10

## 2015-04-15 MED ORDER — LACTATED RINGERS IV SOLN: 1000.0000 mL | INTRAVENOUS | Status: DC

## 2015-04-15 MED ORDER — TRIAMCINOLONE ACETONIDE 40 MG/ML IJ SUSP
INTRAMUSCULAR | Status: AC
  Filled 2015-04-15: qty 1

## 2015-04-15 MED ORDER — SODIUM CHLORIDE 0.9 % IJ SOLN
20.0000 mL | Freq: Once | INTRAMUSCULAR | Status: AC
  Administered 2015-04-15 (×2)

## 2015-04-15 MED ORDER — LIDOCAINE HCL (PF) 1 % IJ SOLN
INTRAMUSCULAR | Status: AC
  Filled 2015-04-15: qty 5

## 2015-04-15 MED ORDER — CEFAZOLIN SODIUM 1-5 GM-% IV SOLN: 1.0000 g | Freq: Once | INTRAVENOUS | Status: DC

## 2015-04-15 MED ORDER — LIDOCAINE HCL (PF) 1 % IJ SOLN
10.0000 mL | Freq: Once | INTRAMUSCULAR | Status: AC
  Administered 2015-04-15: 13:00:00 via SUBCUTANEOUS

## 2015-04-15 MED ORDER — CEFUROXIME AXETIL 250 MG PO TABS: 250.0000 mg | ORAL_TABLET | Freq: Two times a day (BID) | ORAL | Status: DC

## 2015-04-15 MED ORDER — TRIAMCINOLONE ACETONIDE 40 MG/ML IJ SUSP
40.0000 mg | Freq: Once | INTRAMUSCULAR | Status: AC
  Administered 2015-04-15: 13:00:00

## 2015-04-15 NOTE — Progress Notes (Signed)
Safety precautions to be maintained throughout the outpatient stay will include: orient to surroundings, keep bed in low position, maintain call bell within reach at all times, provide assistance with transfer out of bed and ambulation.  

## 2015-04-15 NOTE — Progress Notes (Signed)
   Subjective:    Patient ID: Tiffany Gomez, female    DOB: 06-Jan-1966, 50 y.o.   MRN: [id number]  HPI  PROCEDURE PERFORMED: Lumbar epidural steroid injection   NOTE: The patient is a 50 y.o. female who returns to Pain Management Center for further evaluation and treatment of pain involving the lumbar and lower extremity region.  MRI revealed the patient to be with Degenerative disc disease lumbar spine Mild to moderate multilevel degenerative changes with facet arthrosis worse at L5-S1 without significant spinal canal of neural foraminal stenosis mild asymmetric enlargement of the right L5 nerve root which could reflect a radiculitis, L5-S1 mild bulging disc with symptoms small central disc protrusion. L4-5 facet arthrosis. L3-4 disc bulging with facet arthropathy. L2-3 left foraminal/extra foraminal disc protrusion slightly displaces the exiting L2 nerve root. L1-2 shallow left foraminal/extra foraminal disc protrusion.  The patient has lumbar stenosis and  Radiculopathy   The risks, benefits, and expectations of the procedure have been discussed and explained to the patient who was understanding and in agreement with suggested treatment plan. We will proceed with lumbar epidural steroid injection as discussed and as explained to the patient who is willing to proceed with procedure as planned.   DESCRIPTION OF PROCEDURE: Lumbar epidural steroid injection with EKG, blood pressure, pulse, and pulse oximetry monitoring. The procedure was performed with the patient in the prone position under fluoroscopic guidance. A local anesthetic skin wheal of 1.5% plain lidocaine was accomplished at proposed entry site. An 18-gauge Tuohy epidural needle was inserted at the L  o vertebral body level  rigt of the midline via loss-of-resistance technique with negative heme and negative CSF return. A total of 4 mL of Preservative-Free normal saline with 40 mg of Kenalog injected incrementally via epidurally placed  needle. Needle was removed.    A total of 40 mg of Kenalog was utilized for the procedure.  Patient tolerated  procedure well    PLAN   Continue present medication oxycodone and begin Ceftin antibiotic. Please obtain your antibiotic Ceftin today and begin taking antibiotic today. We will consider resuming Neurontin and adding Neurontin to your oxycodone regimen as we discussed today  F/U PCP [name]  for evaliation of  BP and general medical  condition.  F/U surgical evaluation as discussed with Dr. Dutch QuintPoole  F/U neurological evaluation. May consider PNCV/EMG studies and other studies pending follow-up evaluations  May consider radiofrequency rhizolysis or intraspinal procedures pending response to present treatment and F/U evaluation.  Patient to call Pain Management Center should patient have concerns prior to scheduled return appointment.                   The patient tolerated the injection well.    The patient is understanding and agrees with the suggested  treatment plan   Review of Systems     Objective:   Physical Exam        Assessment & Plan:

## 2015-04-15 NOTE — Patient Instructions (Addendum)
Continue present medication oxycodone and begin Ceftin antibiotic. Please obtain your antibiotic Ceftin today and begin taking antibiotic today. We will consider resuming Neurontin and adding Neurontin to your oxycodone regimen as we discussed today  F/U PCP Sharee Pimple  for evaliation of  BP and general medical  condition.  F/U surgical evaluation as discussed with Dr. Dutch Quint  F/U neurological evaluation. May consider PNCV/EMG studies and other studies pending follow-up evaluations  May consider radiofrequency rhizolysis or intraspinal procedures pending response to present treatment and F/U evaluation.  Patient to call Pain Management Center should patient have concerns prior to scheduled return appointment.  Description: The epidural space surrounds the nerves as they exit the spinal cord.  In some patients, the nerves can be compressed and inflamed by a bulging disc or a tight spinal canal (spinal stenosis).  By injecting steroids into the epidural space, we can bring irritated nerves into direct contact with a potentially helpful medication.  These steroids act directly on the irritated nerves and can reduce swelling and inflammation which often leads to decreased pain.  Epidural steroids may be injected anywhere along the spine and from the neck to the low back depending upon the location of your pain.   After numbing the skin with local anesthetic (like Novocaine), a small needle is passed into the epidural space slowly.  You may experience a sensation of pressure while this is being done.  The entire block usually last less than 10 minutes.  Conditions which may be treated by epidural steroids:   Low back and leg pain  Neck and arm pain  Spinal stenosis  Post-laminectomy syndrome  Herpes zoster (shingles) pain  Pain from compression fractures  Preparation for the injection:  1. Do not eat any solid food or dairy products within 6 hours of your appointment.  2. You may  drink clear liquids up to 2 hours before appointment.  Clear liquids include water, black coffee, juice or soda.  No milk or cream please. 3. You may take your regular medication, including pain medications, with a sip of water before your appointment  Diabetics should hold regular insulin (if taken separately) and take 1/2 normal NPH dos the morning of the procedure.  Carry some sugar containing items with you to your appointment. 4. A driver must accompany you and be prepared to drive you home after your procedure.  5. Bring all your current medications with your. 6. An IV may be inserted and sedation may be given at the discretion of the physician.   7. A blood pressure cuff, EKG and other monitors will often be applied during the procedure.  Some patients may need to have extra oxygen administered for a short period. 8. You will be asked to provide medical information, including your allergies, prior to the procedure.  We must know immediately if you are taking blood thinners (like Coumadin/Warfarin)  Or if you are allergic to IV iodine contrast (dye). We must know if you could possible be pregnant.  Possible side-effects:  Bleeding from needle site  Infection (rare, may require surgery)  Nerve injury (rare)  Numbness & tingling (temporary)  Difficulty urinating (rare, temporary)  Spinal headache ( a headache worse with upright posture)  Light -headedness (temporary)  Pain at injection site (several days)  Decreased blood pressure (temporary)  Weakness in arm/leg (temporary)  Pressure sensation in back/neck (temporary)  Call if you experience:  Fever/chills associated with headache or increased back/neck pain.  Headache worsened by an upright position.  New onset weakness or numbness of an extremity below the injection site  Hives or difficulty breathing (go to the emergency room)  Inflammation or drainage at the infection site  Severe back/neck pain  Any new  symptoms which are concerning to you  Please note:  Although the local anesthetic injected can often make your back or neck feel good for several hours after the injection, the pain will likely return.  It takes 3-7 days for steroids to work in the epidural space.  You may not notice any pain relief for at least that one week.  If effective, we will often do a series of three injections spaced 3-6 weeks apart to maximally decrease your pain.  After the initial series, we generally will wait several months before considering a repeat injection of the same type.  If you have any questions, please call [phone] [facility] Inst[facility]  General Discharge Instructions :  If you need to reach your doctor call: Monday-Friday 8:00 am - 4:00 pm at [phone] or toll free [phone].  After clinic hours [phone] to have operator reach doctor.  Bring all of your medication bottles to all your appointments in the pain clinic.  To cancel or reschedule your appointment with Pain Management please remember to call 24 hours in advance to avoid a fee.  Refer to the educational materials which you have been given on: General Risks, I had my Procedure. Discharge Instructions, Post Sedation.  Post Procedure Instructions:  The drugs you were given will stay in your system until tomorrow, so for the next 24 hours you should not drive, make any legal decisions or drink any alcoholic beverages.  You may eat anything you prefer, but it is better to start with liquids then soups and crackers, and gradually work up to solid foods.  Please notify your doctor immediately if you have any unusual bleeding, trouble breathing or pain that is not related to your normal pain.  Depending on the type of procedure that was done, some parts of your body may feel week and/or numb.  This usually clears up by tonight or the next day.  Walk with the use  of an assistive device or accompanied by an adult for the 24 hours.  You may use ice on the affected area for the first 24 hours.  Put ice in a Ziploc bag and cover with a towel and place against area 15 minutes on 15 minutes off.  You may switch to heat after 24 hours.

## 2015-04-16 ENCOUNTER — Telehealth: Payer: Self-pay | Admitting: *Deleted

## 2015-04-16 NOTE — Telephone Encounter (Signed)
Spoke with patient re; procedure on yesterday.  Verbalizes no complications or concerns from procedure on yesterday.

## 2015-04-21 ENCOUNTER — Ambulatory Visit: Payer: Medicare Other | Attending: Otolaryngology

## 2015-04-21 DIAGNOSIS — G4733 Obstructive sleep apnea (adult) (pediatric): Secondary | ICD-10-CM | POA: Insufficient documentation

## 2015-04-21 DIAGNOSIS — R0683 Snoring: Secondary | ICD-10-CM | POA: Insufficient documentation

## 2015-05-06 ENCOUNTER — Ambulatory Visit: Payer: Medicare Other | Attending: Pain Medicine | Admitting: Pain Medicine

## 2015-05-06 ENCOUNTER — Encounter: Payer: Self-pay | Admitting: Pain Medicine

## 2015-05-06 VITALS — BP 126/99 | HR 109 | Temp 97.5°F | Resp 16 | Wt 375.0 lb

## 2015-05-06 DIAGNOSIS — M542 Cervicalgia: Secondary | ICD-10-CM | POA: Diagnosis present

## 2015-05-06 DIAGNOSIS — Z9889 Other specified postprocedural states: Secondary | ICD-10-CM | POA: Insufficient documentation

## 2015-05-06 DIAGNOSIS — M79606 Pain in leg, unspecified: Secondary | ICD-10-CM | POA: Diagnosis present

## 2015-05-06 DIAGNOSIS — M5020 Other cervical disc displacement, unspecified cervical region: Secondary | ICD-10-CM

## 2015-05-06 DIAGNOSIS — M47816 Spondylosis without myelopathy or radiculopathy, lumbar region: Secondary | ICD-10-CM

## 2015-05-06 DIAGNOSIS — M47896 Other spondylosis, lumbar region: Secondary | ICD-10-CM | POA: Diagnosis not present

## 2015-05-06 DIAGNOSIS — M5030 Other cervical disc degeneration, unspecified cervical region: Secondary | ICD-10-CM | POA: Diagnosis not present

## 2015-05-06 DIAGNOSIS — M5416 Radiculopathy, lumbar region: Secondary | ICD-10-CM

## 2015-05-06 DIAGNOSIS — M4806 Spinal stenosis, lumbar region: Secondary | ICD-10-CM | POA: Insufficient documentation

## 2015-05-06 DIAGNOSIS — M5116 Intervertebral disc disorders with radiculopathy, lumbar region: Secondary | ICD-10-CM | POA: Insufficient documentation

## 2015-05-06 DIAGNOSIS — M5000 Cervical disc disorder with myelopathy, unspecified cervical region: Secondary | ICD-10-CM

## 2015-05-06 DIAGNOSIS — M5126 Other intervertebral disc displacement, lumbar region: Secondary | ICD-10-CM | POA: Diagnosis not present

## 2015-05-06 DIAGNOSIS — M546 Pain in thoracic spine: Secondary | ICD-10-CM | POA: Diagnosis present

## 2015-05-06 MED ORDER — OXYCODONE HCL 10 MG PO TABS: ORAL_TABLET | ORAL | Status: DC

## 2015-05-06 NOTE — Progress Notes (Signed)
Subjective:    Patient ID: Tiffany Gomez, female    DOB: 1965/08/25, 50 y.o.   MRN: [id number]  HPI  The patient is a 50 year old female who returns to pain management Center for further evaluation and treatment of pain involving the region of the neck entire back upper and lower extremity regions. The patient is status post surgery of the cervical region. The patient is with lower back and lower extremity pain of moderate to moderately severe degree. We've discussed patient undergoing reevaluation with Dr. Dutch Quint. The patient states that Dr. Dutch Quint is considering surgery of the lumbar region due to patient's obesity. The patient states that Dr. Dutch Quint has informed her that she is to continue treatment in pain management center under Dr. Eniola Cerullo. The patient denies any trauma change in activities of daily living the call significant change in symptomatology. The patient does admit to increased lower back lower extremity pain aggravated by twisting turning maneuvers. We discussed additional treatment and have informed patient that we wish to consider patient for lumbar facet, medial branch nerve, blocks as well as have patient undergo reevaluation with Dr. Dutch Quint with consideration being given to additional studies of the spine as well. We will proceed with lumbar facet, medial branch nerve, blocks at time return appointment in attempt to decrease severity of patient's symptoms, minimize progression of patient's symptoms, and avoid the need for more involved treatment. The patient will follow-up with Dr. Dutch Quint as discussed and we'll consider additional modifications of medications and other special treatment regimen pending response to plan treatment and follow-up evaluation. All were in agreement with suggested treatment plan  Review of Systems     Objective:   Physical Exam There was tenderness of the splenius capitis and a separate talus muscular region a mild degree with well-healed surgical scar  of the cervical region without increased warmth and erythema in the region scar. Patient was attends to palpation over the cervical facet cervical paraspinal muscular region as well as the thoracic facet thoracic paraspinal must reason with evidence of moderate muscle spasms involving the lower thoracic paraspinal musculature region. Palpation of the acromioclavicular and glenohumeral joint regions reproduces minimal discomfort. The patient was with slightly decreased grip strength and Tinel and Phalen's maneuver were without increased pain of significant degree. There was tenderness to palpation over the thoracic facet thoracic paraspinal must reason was attends to palpation with no crepitus of the thoracic region noted. Palpation over the region of the lumbar paraspinal must reason lumbar facet region was with moderate discomfort  Lateral bending rotation extension and palpation over the lumbar facets reproduced moderately severe discomfort. There was tenderness over the PSIS and PII S region as well as the gluteal and piriformis muscular region. Straight leg raising was tolerates approximately 20 without a definite increase increase of pain with dorsiflexion noted. With negative clonus negative Homans. DTRs were difficult to this patient had difficulty relaxing. The predominant portion of patient's pain was reproduced with palpation over the lumbar facets with lateral bending rotation extension and palpation of the lumbar facets reproduced severe discomfort. Abdomen was nontender with no costovertebral tenderness noted.       Assessment & Plan:    Degenerative disc disease lumbar spine Mild to moderate multilevel degenerative changes with facet arthrosis worse at L5-S1 without significant spinal canal of neural foraminal stenosis mild asymmetric enlargement of the right L5 nerve root which could reflect a radiculitis, L5-S1 mild bulging disc with symptoms small central disc protrusion. L4-5 facet  arthrosis.  L3-4 disc bulging with facet arthropathy. L2-3 left foraminal/extra foraminal disc protrusion slightly displaces the exiting L2 nerve root. L1-2 shallow left foraminal/extra foraminal disc protrusion  Lumbar stenosis with neurogenic claudication  Lumbar radiculopathy  Lumbar facet syndrome  Degenerative disc disease cervical spine  Status post surgery of the cervical region      PLAN  Continue present medication oxycodone  Lumbar facet, medial branch nerve, blocks to be performed at time return appointment  F/U PCP Sharee Pimple or other PCP at facility for evaliation of  BP and general medical  condition.   F/U surgical evaluation. Neurosurgical reevaluation with Dr. Dutch Quint as discussed  F/U neurological evaluation as discussed  May consider radiofrequency rhizolysis or intraspinal procedures pending response to present treatment and F/U evaluation.  Patient to call Pain Management Center should patient have concerns prior to scheduled return appointment

## 2015-05-06 NOTE — Progress Notes (Signed)
Safety precautions to be maintained throughout the outpatient stay will include: orient to surroundings, keep bed in low position, maintain call bell within reach at all times, provide assistance with transfer out of bed and ambulation.  

## 2015-05-06 NOTE — Patient Instructions (Addendum)
Continue present medication oxycodone  Lumbar facet, medial branch nerve, blocks to be performed at time return appointment  F/U PCP Sharee Pimple or other PCP at facility for evaliation of  BP and general medical  condition.   F/U surgical evaluation. Neurosurgical reevaluation with Dr. Dutch Quint as discussed  F/U neurological evaluation as discussed  May consider radiofrequency rhizolysis or intraspinal procedures pending response to present treatment and F/U evaluation.  Patient to call [facility] should patient have concerns prior to scheduled return appointment   Preparation for the injection:  1. Do not eat any solid food or dairy products within 6 hours of your appointment.  2. You may drink clear liquids up to 2 hours before appointment.  Clear liquids include water, black coffee, juice or soda.  No milk or cream please. 3. You may take your regular medication, including pain medications, with a sip of water before your appointment  Diabetics should hold regular insulin (if taken separately) and take 1/2 normal NPH dos the morning of the procedure.  Carry some sugar containing items with you to your appointment. 4. A driver must accompany you and be prepared to drive you home after your procedure.  5. Bring all your current medications with your. 6. An IV may be inserted and sedation may be given at the discretion of the physician.   7. A blood pressure cuff, EKG and other monitors will often be applied during the procedure.  Some patients may need to have extra oxygen administered for a short period. 8. You will be asked to provide medical information, including your allergies, prior to the procedure.  We must know immediately if you are taking blood thinners (like Coumadin/Warfarin)  Or if you are allergic to IV iodine contrast (dye). We must know if you could possible be pregnant.  Possible side-effects:  Bleeding from needle site  Infection (rare, may require  surgery)  Nerve injury (rare)  Numbness & tingling (temporary)  Difficulty urinating (rare, temporary)  Spinal headache ( a headache worse with upright posture)  Light -headedness (temporary)  Pain at injection site (several days)  Decreased blood pressure (temporary)  Weakness in arm/leg (temporary)  Pressure sensation in back/neck (temporary)  Call if you experience:  Fever/chills associated with headache or increased back/neck pain.  Headache worsened by an upright position.  New onset weakness or numbness of an extremity below the injection site  Hives or difficulty breathing (go to the emergency room)  Inflammation or drainage at the infection site  Severe back/neck pain  Any new symptoms which are concerning to you  Please note:  Although the local anesthetic injected can often make your back or neck feel good for several hours after the injection, the pain will likely return.  It takes 3-7 days for steroids to work in the epidural space.  You may not notice any pain relief for at least that one week.  If effective, we will often do a series of three injections spaced 3-6 weeks apart to maximally decrease your pain.  After the initial series, we generally will wait several months before considering a repeat injection of the same type.  If you have any questions, please call [phone] [facility] Clinic[facility] R[facility]KS AND COMPLICATIONS  What are the risk, side effects and possible complications? Generally speaking, most procedures are safe.  However, with any procedure there are risks, side effects, and the possibility of complications.  The risks and complications are dependent upon the sites  that are lesioned, or the type of nerve block to be performed.  The closer the procedure is to the spine, the more serious the risks are.  Great care is taken when placing the radio frequency needles, block needles or lesioning probes, but  sometimes complications can occur. 1. Infection: Any time there is an injection through the skin, there is a risk of infection.  This is why sterile conditions are used for these blocks.  There are four possible types of infection. 1. Localized skin infection. 2. Central Nervous System Infection-This can be in the form of Meningitis, which can be deadly. 3. Epidural Infections-This can be in the form of an epidural abscess, which can cause pressure inside of the spine, causing compression of the spinal cord with subsequent paralysis. This would require an emergency surgery to decompress, and there are no guarantees that the patient would recover from the paralysis. 4. Discitis-This is an infection of the intervertebral discs.  It occurs in about 1% of discography procedures.  It is difficult to treat and it may lead to surgery.        2. Pain: the needles have to go through skin and soft tissues, will cause soreness.       3. Damage to internal structures:  The nerves to be lesioned may be near blood vessels or    other nerves which can be potentially damaged.       4. Bleeding: Bleeding is more common if the patient is taking blood thinners such as  aspirin, Coumadin, Ticiid, Plavix, etc., or if he/she have some genetic predisposition  such as hemophilia. Bleeding into the spinal canal can cause compression of the spinal  cord with subsequent paralysis.  This would require an emergency surgery to  decompress and there are no guarantees that the patient would recover from the  paralysis.       5. Pneumothorax:  Puncturing of a lung is a possibility, every time a needle is introduced in  the area of the chest or upper back.  Pneumothorax refers to free air around the  collapsed lung(s), inside of the thoracic cavity (chest cavity).  Another two possible  complications related to a similar event would include: Hemothorax and Chylothorax.   These are variations of the Pneumothorax, where instead of air  around the collapsed  lung(s), you may have blood or chyle, respectively.       6. Spinal headaches: They may occur with any procedures in the area of the spine.       7. Persistent CSF (Cerebro-Spinal Fluid) leakage: This is a rare problem, but may occur  with prolonged intrathecal or epidural catheters either due to the formation of a fistulous  track or a dural tear.       8. Nerve damage: By working so close to the spinal cord, there is always a possibility of  nerve damage, which could be as serious as a permanent spinal cord injury with  paralysis.       9. Death:  Although rare, severe deadly allergic reactions known as "Anaphylactic  reaction" can occur to any of the medications used.      10. Worsening of the symptoms:  We can always make thing worse.  What are the chances of something like this happening? Chances of any of this occuring are extremely low.  By statistics, you have more of a chance of getting killed in a motor vehicle accident: while driving to the hospital than any of  the above occurring .  Nevertheless, you should be aware that they are possibilities.  In general, it is similar to taking a shower.  Everybody knows that you can slip, hit your head and get killed.  Does that mean that you should not shower again?  Nevertheless always keep in mind that statistics do not mean anything if you happen to be on the wrong side of them.  Even if a procedure has a 1 (one) in a 1,000,000 (million) chance of going wrong, it you happen to be that one..Also, keep in mind that by statistics, you have more of a chance of having something go wrong when taking medications.  Who should not have this procedure? If you are on a blood thinning medication (e.g. Coumadin, Plavix, see list of "Blood Thinners"), or if you have an active infection going on, you should not have the procedure.  If you are taking any blood thinners, please inform your physician.  How should I prepare for this  procedure?  Do not eat or drink anything at least six hours prior to the procedure.  Bring a driver with you .  It cannot be a taxi.  Come accompanied by an adult that can drive you back, and that is strong enough to help you if your legs get weak or numb from the local anesthetic.  Take all of your medicines the morning of the procedure with just enough water to swallow them.  If you have diabetes, make sure that you are scheduled to have your procedure done first thing in the morning, whenever possible.  If you have diabetes, take only half of your insulin dose and notify our nurse that you have done so as soon as you arrive at the clinic.  If you are diabetic, but only take blood sugar pills (oral hypoglycemic), then do not take them on the morning of your procedure.  You may take them after you have had the procedure.  Do not take aspirin or any aspirin-containing medications, at least eleven (11) days prior to the procedure.  They may prolong bleeding.  Wear loose fitting clothing that may be easy to take off and that you would not mind if it got stained with Betadine or blood.  Do not wear any jewelry or perfume  Remove any nail coloring.  It will interfere with some of our monitoring equipment.  NOTE: Remember that this is not meant to be interpreted as a complete list of all possible complications.  Unforeseen problems may occur.  BLOOD THINNERS The following drugs contain aspirin or other products, which can cause increased bleeding during surgery and should not be taken for 2 weeks prior to and 1 week after surgery.  If you should need take something for relief of minor pain, you may take acetaminophen which is found in Tylenol,m Datril, Anacin-3 and Panadol. It is not blood thinner. The products listed below are.  Do not take any of the products listed below in addition to any listed on your instruction sheet.  A.P.C or A.P.C with Codeine Codeine Phosphate Capsules #3  Ibuprofen Ridaura  ABC compound Congesprin Imuran rimadil  Advil Cope Indocin Robaxisal  Alka-Seltzer Effervescent Pain Reliever and Antacid Coricidin or Coricidin-D  Indomethacin Rufen  Alka-Seltzer plus Cold Medicine Cosprin Ketoprofen S-A-C Tablets  Anacin Analgesic Tablets or Capsules Coumadin Korlgesic Salflex  Anacin Extra Strength Analgesic tablets or capsules CP-2 Tablets Lanoril Salicylate  Anaprox Cuprimine Capsules Levenox Salocol  Anexsia-D Dalteparin Magan Salsalate  Anodynos Darvon  compound Magnesium Salicylate Sine-off  Ansaid Dasin Capsules Magsal Sodium Salicylate  Anturane Depen Capsules Marnal Soma  APF Arthritis pain formula Dewitt's Pills Measurin Stanback  Argesic Dia-Gesic Meclofenamic Sulfinpyrazone  Arthritis Bayer Timed Release Aspirin Diclofenac Meclomen Sulindac  Arthritis pain formula Anacin Dicumarol Medipren Supac  Analgesic (Safety coated) Arthralgen Diffunasal Mefanamic Suprofen  Arthritis Strength Bufferin Dihydrocodeine Mepro Compound Suprol  Arthropan liquid Dopirydamole Methcarbomol with Aspirin Synalgos  ASA tablets/Enseals Disalcid Micrainin Tagament  Ascriptin Doan's Midol Talwin  Ascriptin A/D Dolene Mobidin Tanderil  Ascriptin Extra Strength Dolobid Moblgesic Ticlid  Ascriptin with Codeine Doloprin or Doloprin with Codeine Momentum Tolectin  Asperbuf Duoprin Mono-gesic Trendar  Aspergum Duradyne Motrin or Motrin IB Triminicin  Aspirin plain, buffered or enteric coated Durasal Myochrisine Trigesic  Aspirin Suppositories Easprin Nalfon Trillsate  Aspirin with Codeine Ecotrin Regular or Extra Strength Naprosyn Uracel  Atromid-S Efficin Naproxen Ursinus  Auranofin Capsules Elmiron Neocylate Vanquish  Axotal Emagrin Norgesic Verin  Azathioprine Empirin or Empirin with Codeine Normiflo Vitamin E  Azolid Emprazil Nuprin Voltaren  Bayer Aspirin plain, buffered or children's or timed BC Tablets or powders Encaprin Orgaran Warfarin Sodium   Buff-a-Comp Enoxaparin Orudis Zorpin  Buff-a-Comp with Codeine Equegesic Os-Cal-Gesic   Buffaprin Excedrin plain, buffered or Extra Strength Oxalid   Bufferin Arthritis Strength Feldene Oxphenbutazone   Bufferin plain or Extra Strength Feldene Capsules Oxycodone with Aspirin   Bufferin with Codeine Fenoprofen Fenoprofen Pabalate or Pabalate-SF   Buffets II Flogesic Panagesic   Buffinol plain or Extra Strength Florinal or Florinal with Codeine Panwarfarin   Buf-Tabs Flurbiprofen Penicillamine   Butalbital Compound Four-way cold tablets Penicillin   Butazolidin Fragmin Pepto-Bismol   Carbenicillin Geminisyn Percodan   Carna Arthritis Reliever Geopen Persantine   Carprofen Gold's salt Persistin   Chloramphenicol Goody's Phenylbutazone   Chloromycetin Haltrain Piroxlcam   Clmetidine heparin Plaquenil   Cllnoril Hyco-pap Ponstel   Clofibrate Hydroxy chloroquine Propoxyphen         Before stopping any of these medications, be sure to consult the physician who ordered them.  Some, such as Coumadin (Warfarin) are ordered to prevent or treat serious conditions such as "deep thrombosis", "pumonary embolisms", and other heart problems.  The amount of time that you may need off of the medication may also vary with the medication and the reason for which you were taking it.  If you are taking any of these medications, please make sure you notify your pain physician before you undergo any procedures.         Pain Management Discharge Instructions  General Discharge Instructions :  If you need to reach your doctor call: Monday-Friday 8:00 am - 4:00 pm at [phone] or toll free [phone].  After clinic hours [phone] to have operator reach doctor.  Bring all of your medication bottles to all your appointments in the pain clinic.  To cancel or reschedule your appointment with Pain Management please remember to call 24 hours in advance to avoid a fee.  Refer to the educational  materials which you have been given on: General Risks, I had my Procedure. Discharge Instructions, Post Sedation.  Post Procedure Instructions:  The drugs you were given will stay in your system until tomorrow, so for the next 24 hours you should not drive, make any legal decisions or drink any alcoholic beverages.  You may eat anything you prefer, but it is better to start with liquids then soups and crackers, and gradually work up to solid foods.  Please notify your doctor immediately  if you have any unusual bleeding, trouble breathing or pain that is not related to your normal pain.  Depending on the type of procedure that was done, some parts of your body may feel week and/or numb.  This usually clears up by tonight or the next day.  Walk with the use of an assistive device or accompanied by an adult for the 24 hours.  You may use ice on the affected area for the first 24 hours.  Put ice in a Ziploc bag and cover with a towel and place against area 15 minutes on 15 minutes off.  You may switch to heat after 24 hours.Facet Blocks Patient Information  Description: The facets are joints in the spine between the vertebrae.  Like any joints in the body, facets can become irritated and painful.  Arthritis can also effect the facets.  By injecting steroids and local anesthetic in and around these joints, we can temporarily block the nerve supply to them.  Steroids act directly on irritated nerves and tissues to reduce selling and inflammation which often leads to decreased pain.  Facet blocks may be done anywhere along the spine from the neck to the low back depending upon the location of your pain.   After numbing the skin with local anesthetic (like Novocaine), a small needle is passed onto the facet joints under x-ray guidance.  You may experience a sensation of pressure while this is being done.  The entire block usually lasts about 15-25 minutes.   Conditions which may be treated by facet  blocks:   Low back/buttock pain  Neck/shoulder pain  Certain types of headaches  Preparation for the injection:  12. Do not eat any solid food or dairy products within 6 hours of your appointment. 13. You may drink clear liquid up to 2 hours before appointment.  Clear liquids include water, black coffee, juice or soda.  No milk or cream please. 14. You may take your regular medication, including pain medications, with a sip of water before your appointment.  Diabetics should hold regular insulin (if taken separately) and take 1/2 normal NPH dose the morning of the procedure.  Carry some sugar containing items with you to your appointment. 15. A driver must accompany you and be prepared to drive you home after your procedure. 16. Bring all your current medications with you. 17. An IV may be inserted and sedation may be given at the discretion of the physician. 18. A blood pressure cuff, EKG and other monitors will often be applied during the procedure.  Some patients may need to have extra oxygen administered for a short period. 19. You will be asked to provide medical information, including your allergies and medications, prior to the procedure.  We must know immediately if you are taking blood thinners (like Coumadin/Warfarin) or if you are allergic to IV iodine contrast (dye).  We must know if you could possible be pregnant.  Possible side-effects:   Bleeding from needle site  Infection (rare, may require surgery)  Nerve injury (rare)  Numbness & tingling (temporary)  Difficulty urinating (rare, temporary)  Spinal headache (a headache worse with upright posture)  Light-headedness (temporary)  Pain at injection site (serveral days)  Decreased blood pressure (rare, temporary)  Weakness in arm/leg (temporary)  Pressure sensation in back/neck (temporary)   Call if you experience:   Fever/chills associated with headache or increased back/neck pain  Headache worsened by  an upright position  New onset, weakness or numbness of an extremity  below the injection site  Hives or difficulty breathing (go to the emergency room)  Inflammation or drainage at the injection site(s)  Severe back/neck pain greater than usual  New symptoms which are concerning to you  Please note:  Although the local anesthetic injected can often make your back or neck feel good for several hours after the injection, the pain will likely return. It takes 3-7 days for steroids to work.  You may not notice any pain relief for at least one week.  If effective, we will often do a series of 2-3 injections spaced 3-6 weeks apart to maximally decrease your pain.  After the initial series, you may be a candidate for a more permanent nerve block of the facets.  If you have any questions, please call #[phone] [facility]

## 2015-05-07 ENCOUNTER — Encounter: Payer: Medicare Other | Admitting: Pain Medicine

## 2015-05-11 ENCOUNTER — Other Ambulatory Visit: Payer: Self-pay | Admitting: Pain Medicine

## 2015-05-11 ENCOUNTER — Ambulatory Visit: Payer: Medicare Other | Attending: Pain Medicine | Admitting: Pain Medicine

## 2015-05-11 ENCOUNTER — Encounter: Payer: Self-pay | Admitting: Pain Medicine

## 2015-05-11 VITALS — BP 95/60 | HR 100 | Temp 98.2°F | Resp 16 | Ht 66.0 in | Wt 360.0 lb

## 2015-05-11 DIAGNOSIS — M5126 Other intervertebral disc displacement, lumbar region: Secondary | ICD-10-CM | POA: Insufficient documentation

## 2015-05-11 DIAGNOSIS — M47816 Spondylosis without myelopathy or radiculopathy, lumbar region: Secondary | ICD-10-CM | POA: Diagnosis not present

## 2015-05-11 DIAGNOSIS — M5000 Cervical disc disorder with myelopathy, unspecified cervical region: Secondary | ICD-10-CM

## 2015-05-11 DIAGNOSIS — M5020 Other cervical disc displacement, unspecified cervical region: Secondary | ICD-10-CM

## 2015-05-11 DIAGNOSIS — M5136 Other intervertebral disc degeneration, lumbar region: Secondary | ICD-10-CM | POA: Diagnosis not present

## 2015-05-11 DIAGNOSIS — M5416 Radiculopathy, lumbar region: Secondary | ICD-10-CM

## 2015-05-11 DIAGNOSIS — M79606 Pain in leg, unspecified: Secondary | ICD-10-CM | POA: Diagnosis present

## 2015-05-11 DIAGNOSIS — M545 Low back pain: Secondary | ICD-10-CM | POA: Diagnosis present

## 2015-05-11 DIAGNOSIS — M5030 Other cervical disc degeneration, unspecified cervical region: Secondary | ICD-10-CM

## 2015-05-11 MED ORDER — TRIAMCINOLONE ACETONIDE 40 MG/ML IJ SUSP
INTRAMUSCULAR | Status: AC
  Administered 2015-05-11: 40 mg
  Filled 2015-05-11: qty 1

## 2015-05-11 MED ORDER — SODIUM CHLORIDE 0.9 % IJ SOLN
INTRAMUSCULAR | Status: AC
  Administered 2015-05-11: 20 mL
  Filled 2015-05-11: qty 20

## 2015-05-11 MED ORDER — CEFUROXIME AXETIL 250 MG PO TABS: 250.0000 mg | ORAL_TABLET | Freq: Two times a day (BID) | ORAL | Status: DC

## 2015-05-11 MED ORDER — CEFAZOLIN SODIUM 1-5 GM-% IV SOLN: 1.0000 g | Freq: Once | INTRAVENOUS | Status: DC

## 2015-05-11 MED ORDER — CEFAZOLIN SODIUM 1 G IJ SOLR
INTRAMUSCULAR | Status: AC
Start: 2015-05-11 — End: 2015-05-11
  Administered 2015-05-11: 1 g
  Filled 2015-05-11: qty 10

## 2015-05-11 MED ORDER — GABAPENTIN 300 MG PO CAPS: ORAL_CAPSULE | ORAL | Status: DC

## 2015-05-11 MED ORDER — TRIAMCINOLONE ACETONIDE 40 MG/ML IJ SUSP
40.0000 mg | Freq: Once | INTRAMUSCULAR | Status: AC
  Administered 2015-05-11: 40 mg

## 2015-05-11 MED ORDER — LIDOCAINE HCL (PF) 1 % IJ SOLN
INTRAMUSCULAR | Status: AC
  Filled 2015-05-11: qty 10

## 2015-05-11 MED ORDER — SODIUM CHLORIDE 0.9% FLUSH: 20.0000 mL | Freq: Once | INTRAVENOUS | Status: DC

## 2015-05-11 NOTE — Progress Notes (Signed)
Subjective:    Patient ID: Tiffany Gomez, female    DOB: 06-17-1965, 50 y.o.   MRN: [id number]  HPI PROCEDURE PERFORMED: Lumbar facet (medial branch block)   NOTE: The patient is a 50 y.o. female who returns to [facility] Center for further evaluation and treatment of pain involving the lumbar and lower extremity region. MRI  revealed the patient to be with evidence of degenerative disc disease lumbar spine Mild to moderate multilevel degenerative changes with facet arthrosis worse at L5-S1 without significant spinal canal of neural foraminal stenosis mild asymmetric enlargement of the right L5 nerve root which could reflect a radiculitis, L5-S1 mild bulging disc with symptoms small central disc protrusion. L4-5 facet arthrosis. L3-4 disc bulging with facet arthropathy. L2-3 left foraminal/extra foraminal disc protrusion slightly displaces the exiting L2 nerve root. L1-2 shallow left foraminal/extra foraminal disc protrusion. There is concern regarding significant component of patient's pain being due to facet syndrome. The risks, benefits, and expectations of the procedure have been discussed and explained to the patient who was understanding and in agreement with suggested treatment plan. We will proceed with interventional treatment as discussed and as explained to the patient who was understanding and wished to proceed with procedure as planned.   DESCRIPTION OF PROCEDURE: Lumbar facet (medial branch block) with  EKG, blood pressure, pulse, and pulse oximetry monitoring. The procedure was performed with the patient in the prone position. Betadine prep of proposed entry site performed.   NEEDLE PLACEMENT AT: Left L 2 lumbar facet (medial branch block). Under fluoroscopic guidance with oblique orientation of 15 degrees, a 22-gauge needle was inserted at the L 2 vertebral body level with needle placed at the targeted area of Burton's Eye or Eye of the Scotty Dog with documentation of needle  placement in the superior and lateral border of targeted area of Burton's Eye or Eye of the Scotty Dog with oblique orientation of 15 degrees. Following documentation of needle placement at the L 2 vertebral body level, needle placement was then accomplished at the L 3 vertebral body level.   NEEDLE PLACEMENT AT L3, L4, and L5 VERTEBRAL BODY LEVELS ON THE LEFT SIDE The procedure was performed at the L3, L4, and L5 vertebral body levels exactly as was performed at the L 2 vertebral body level utilizing the same technique and under fluoroscopic guidance.  NEEDLE PLACEMENT AT THE SACRAL ALA with AP view of the lumbosacral spine. With the patient in the prone position, Betadine prep of proposed entry site accomplished, a 22 gauge needle was inserted in the region of the sacral ala (groove formed by the superior articulating process of S1 and the sacral wing). Following documentation of needle placement at the sacral ala,     Needle placement was then verified at all levels on lateral view. Following documentation of needle placement at all levels on lateral view and following negative aspiration for heme and CSF, each level was injected with 1 mL of preservative-free normal saline with Kenalog.     LUMBAR FACET, MEDIAL BRANCH NERVE, BLOCKS PERFORMED ON THE RIGHT SIDE   The procedure was performed on the right side exactly as was performed on the left side at the same levels and utilizing the same technique under fluoroscopic guidance.     The patient tolerated the procedure well. A total of 40 mg of Kenalog was utilized for the procedure.   PLAN:  1. Medications: The patient will continue presently prescribed medication oxycodone. The patient was also prescribed  Neurontin on today's visit. The patient has previously taken Neurontin and is very familiar with the effects of Neurontin. The patient was cautioned regarding respiratory depression, excessive sedation, confusion, and other side effects  which can occur with Neurontin and oxycodone. The patient expressed understanding and willingness to comply to avoid undesirable side effects. 2. May consider modification of treatment regimen at time of return appointment pending response to treatment rendered on today's visit. 3. The patient is to follow-up with primary care physician  [facility] ffor further evaluation of blood pressure and general medical condition status post steroid injection performed on today's visit. 4. Surgical follow-up evaluation. Patient will follow-up with Dr. Dutch Quint  5. Neurological follow-up evaluation. May consider [facility] EMG studies 6. The patient may be candidate for radiofrequency procedures, implantation type procedures, and other treatment pending response to treatment and follow-up evaluation. 7. The patient has been advised to call the Pain Management Center prior to scheduled return appointment should there be significant change in condition or should patient have other concerns regarding condition prior to scheduled return appointment.  The patient is understanding and in agreement with suggested treatment plan.    Review of Systems     Objective:   Physical Exam        Assessment & Plan:

## 2015-05-11 NOTE — Patient Instructions (Addendum)
PLAN   Continue present medication oxycodone and begin Ceftin antibiotic. Please obtain your antibiotic Ceftin today and begin taking antibiotic today. Resume Neurontin as discussed. Neurontin can cause respiratory depression, excessive sedation, confusion, and other side effects. You have previously taken Neurontin. Please exercise extreme caution when taking Neurontin and other medications  F/U PCP Sharee Pimple  for evaliation of  BP and general medical  condition.  F/U surgical evaluation as discussed with Dr. Dutch Quint  F/U neurological evaluation. May consider PNCV/EMG studies and other studies pending follow-up evaluations  May consider radiofrequency rhizolysis or intraspinal procedures pending response to present treatment and F/U evaluation.  Patient to call Pain Management Center should patient have concerns prior to scheduled return appointment.Facet Blocks Patient Information  Description: The facets are joints in the spine between the vertebrae.  Like any joints in the body, facets can become irritated and painful.  Arthritis can also effect the facets.  By injecting steroids and local anesthetic in and around these joints, we can temporarily block the nerve supply to them.  Steroids act directly on irritated nerves and tissues to reduce selling and inflammation which often leads to decreased pain.  Facet blocks may be done anywhere along the spine from the neck to the low back depending upon the location of your pain.   After numbing the skin with local anesthetic (like Novocaine), a small needle is passed onto the facet joints under x-ray guidance.  You may experience a sensation of pressure while this is being done.  The entire block usually lasts about 15-25 minutes.   Conditions which may be treated by facet blocks:   Low back/buttock pain  Neck/shoulder pain  Certain types of headaches  Preparation for the injection:  1. Do not eat any solid food or dairy products  within 6 hours of your appointment. 2. You may drink clear liquid up to 2 hours before appointment.  Clear liquids include water, black coffee, juice or soda.  No milk or cream please. 3. You may take your regular medication, including pain medications, with a sip of water before your appointment.  Diabetics should hold regular insulin (if taken separately) and take 1/2 normal NPH dose the morning of the procedure.  Carry some sugar containing items with you to your appointment. 4. A driver must accompany you and be prepared to drive you home after your procedure. 5. Bring all your current medications with you. 6. An IV may be inserted and sedation may be given at the discretion of the physician. 7. A blood pressure cuff, EKG and other monitors will often be applied during the procedure.  Some patients may need to have extra oxygen administered for a short period. 8. You will be asked to provide medical information, including your allergies and medications, prior to the procedure.  We must know immediately if you are taking blood thinners (like Coumadin/Warfarin) or if you are allergic to IV iodine contrast (dye).  We must know if you could possible be pregnant.  Possible side-effects:   Bleeding from needle site  Infection (rare, may require surgery)  Nerve injury (rare)  Numbness & tingling (temporary)  Difficulty urinating (rare, temporary)  Spinal headache (a headache worse with upright posture)  Light-headedness (temporary)  Pain at injection site (serveral days)  Decreased blood pressure (rare, temporary)  Weakness in arm/leg (temporary)  Pressure sensation in back/neck (temporary)   Call if you experience:   Fever/chills associated with headache or increased back/neck pain  Headache worsened by an  upright position  New onset, weakness or numbness of an extremity below the injection site  Hives or difficulty breathing (go to the emergency room)  Inflammation or  drainage at the injection site(s)  Severe back/neck pain greater than usual  New symptoms which are concerning to you  Please note:  Although the local anesthetic injected can often make your back or neck feel good for several hours after the injection, the pain will likely return. It takes 3-7 days for steroids to work.  You may not notice any pain relief for at least one week.  If effective, we will often do a series of 2-3 injections spaced 3-6 weeks apart to maximally decrease your pain.  After the initial series, you may be a candidate for a more permanent nerve block of the facets.  If you have any questions, please call [phone] [facility] Discharge Instructions  General Discharge Instructions :  If you need to reach your doctor call: Monday-Friday 8:00 am - 4:00 pm at [phone] or toll free [phone].  After clinic hours [phone] to have operator reach doctor.  Bring all of your medication bottles to all your appointments in the pain clinic.  To cancel or reschedule your appointment with Pain Management please remember to call 24 hours in advance to avoid a fee.  Refer to the educational materials which you have been given on: General Risks, I had my Procedure. Discharge Instructions, Post Sedation.  Post Procedure Instructions:  The drugs you were given will stay in your system until tomorrow, so for the next 24 hours you should not drive, make any legal decisions or drink any alcoholic beverages.  You may eat anything you prefer, but it is better to start with liquids then soups and crackers, and gradually work up to solid foods.  Please notify your doctor immediately if you have any unusual bleeding, trouble breathing or pain that is not related to your normal pain.  Depending on the type of procedure that was done, some parts of your body may feel week and/or numb.  This usually clears up by tonight or the  next day.  Walk with the use of an assistive device or accompanied by an adult for the 24 hours.  You may use ice on the affected area for the first 24 hours.  Put ice in a Ziploc bag and cover with a towel and place against area 15 minutes on 15 minutes off.  You may switch to heat after 24 hours.

## 2015-05-12 ENCOUNTER — Telehealth: Payer: Self-pay | Admitting: *Deleted

## 2015-05-12 ENCOUNTER — Telehealth: Payer: Self-pay | Admitting: Pain Medicine

## 2015-05-12 NOTE — Telephone Encounter (Signed)
No problems post procedure. 

## 2015-05-12 NOTE — Telephone Encounter (Signed)
Denies problems. 

## 2015-05-12 NOTE — Telephone Encounter (Signed)
neurontin making her feel funny and not helping pain, wants to change meds

## 2015-05-13 NOTE — Telephone Encounter (Signed)
Patient will need appointment to change medications The Neurontin dose is low and we most likely need to increase the Neurontin dose Please discussed with me in further detail

## 2015-05-13 NOTE — Telephone Encounter (Signed)
Left voicemail for patient to return call and to discontinue neurontin.

## 2015-05-14 NOTE — Telephone Encounter (Signed)
Patient returned our call, advised to stop the Neurontin.

## 2015-05-22 ENCOUNTER — Other Ambulatory Visit: Payer: Self-pay | Admitting: Pain Medicine

## 2015-05-25 ENCOUNTER — Encounter: Payer: Self-pay | Admitting: Pain Medicine

## 2015-05-25 ENCOUNTER — Ambulatory Visit: Payer: Medicare Other | Admitting: Pain Medicine

## 2015-05-25 ENCOUNTER — Ambulatory Visit: Payer: Medicare Other | Attending: Pain Medicine | Admitting: Pain Medicine

## 2015-05-25 VITALS — BP 129/84 | HR 117 | Temp 96.7°F | Resp 18 | Ht 66.0 in | Wt >= 6400 oz

## 2015-05-25 DIAGNOSIS — M5126 Other intervertebral disc displacement, lumbar region: Secondary | ICD-10-CM | POA: Diagnosis not present

## 2015-05-25 DIAGNOSIS — M47896 Other spondylosis, lumbar region: Secondary | ICD-10-CM | POA: Insufficient documentation

## 2015-05-25 DIAGNOSIS — M5116 Intervertebral disc disorders with radiculopathy, lumbar region: Secondary | ICD-10-CM | POA: Diagnosis not present

## 2015-05-25 DIAGNOSIS — M5416 Radiculopathy, lumbar region: Secondary | ICD-10-CM

## 2015-05-25 DIAGNOSIS — M5020 Other cervical disc displacement, unspecified cervical region: Secondary | ICD-10-CM

## 2015-05-25 DIAGNOSIS — Z9889 Other specified postprocedural states: Secondary | ICD-10-CM | POA: Diagnosis not present

## 2015-05-25 DIAGNOSIS — M546 Pain in thoracic spine: Secondary | ICD-10-CM | POA: Diagnosis present

## 2015-05-25 DIAGNOSIS — M542 Cervicalgia: Secondary | ICD-10-CM | POA: Diagnosis present

## 2015-05-25 DIAGNOSIS — M5030 Other cervical disc degeneration, unspecified cervical region: Secondary | ICD-10-CM | POA: Diagnosis not present

## 2015-05-25 DIAGNOSIS — M4806 Spinal stenosis, lumbar region: Secondary | ICD-10-CM | POA: Diagnosis not present

## 2015-05-25 DIAGNOSIS — M5000 Cervical disc disorder with myelopathy, unspecified cervical region: Secondary | ICD-10-CM

## 2015-05-25 DIAGNOSIS — M47816 Spondylosis without myelopathy or radiculopathy, lumbar region: Secondary | ICD-10-CM

## 2015-05-25 DIAGNOSIS — M79606 Pain in leg, unspecified: Secondary | ICD-10-CM | POA: Diagnosis present

## 2015-05-25 MED ORDER — GABAPENTIN 300 MG PO CAPS: ORAL_CAPSULE | ORAL | Status: DC

## 2015-05-25 MED ORDER — CEFUROXIME AXETIL 500 MG PO TABS: ORAL_TABLET | ORAL | Status: DC

## 2015-05-25 MED ORDER — OXYCODONE HCL 10 MG PO TABS: ORAL_TABLET | ORAL | Status: DC

## 2015-05-25 NOTE — Progress Notes (Signed)
Subjective:    Patient ID: Tiffany Gomez, female    DOB: 1966-03-30, 50 y.o.   MRN: [id number]  HPI  The patient is a 50 year old female who returns to pain management for further evaluation and treatment of pain involving the neck entire back upper and lower extremity region. The patient has undergone surgical evaluation by neurosurgeon Dr. Dutch Quint was recommended patient return to pain management for further evaluation and treatment of her pain. We discussed patient's condition and will adjust Neurontin dose while patient will continue oxycodone. We will consider patient for interventional treatment consisting of lumbosacral selective nerve root block to be performed at time return appointment in attempt to decrease severity of patient's symptoms, minimize progression of symptoms, and avoid the need for more involved treatment. The patient fell and denies injury of significant degree. Patient also denied hitting.. We prescribed Ceftin antibiotic since patient has some abrasions of the knees and will consider additional modifications and treatment pending further evaluation. We have also advised patient to follow up with her primary care physician this week for further assessment of her condition and to call pain management should she notice any change in her condition prior to scheduled return appointment. The patient was understanding and agreed to suggested treatment plan we will proceed with lumbosacral selective nerve root block at time of return appointment as discussed.  Review of Systems     Objective:   Physical Exam  There was tenderness of the splenius capitis and occipitalis musculature region a mild to moderate degree. There was limited range of motion of the cervical spine with unremarkable Spurling's maneuver. The patient was with slightly decreased grip strength and Tinel and Phalen's maneuver were without increase of pain of significant degree. There was tenderness of the  acromioclavicular and glenohumeral joint regions of mild degree. Palpation over the lumbar paraspinal must reason lumbar facet region was with moderate discomfort with lateral bending rotation extension and palpation of the lumbar facets reproducing moderately severe discomfort on the right as well as on the left. EHL strength appeared to be decreased and there was question decreased sensation of the L5 dermatomal distribution with negative clonus negative Homans. There was moderate tenderness over the PSIS and PII S region as well as the gluteal and piriformis musculature regions. There was mild tenderness of the greater trochanteric region iliotibial band region. It was negative clonus negative Homans. EHL strength appeared to be decreased. There were abrasions of both knees with pain with range of motion maneuvers of the knees.  . There was negative anterior and posterior drawer signs without increased warmth or erythema of the knees Abdomen was protuberant with no excessive tends to palpation and no costovertebral tenderness noted      Assessment & Plan:    Degenerative disc disease lumbar spine Mild to moderate multilevel degenerative changes with facet arthrosis worse at L5-S1 without significant spinal canal of neural foraminal stenosis mild asymmetric enlargement of the right L5 nerve root which could reflect a radiculitis, L5-S1 mild bulging disc with symptoms small central disc protrusion. L4-5 facet arthrosis. L3-4 disc bulging with facet arthropathy. L2-3 left foraminal/extra foraminal disc protrusion slightly displaces the exiting L2 nerve root. L1-2 shallow left foraminal/extra foraminal disc protrusion  Lumbar stenosis with neurogenic claudication  Lumbar radiculopathy  Lumbar facet syndrome  Degenerative disc disease cervical spine  Status post surgery of the cervical region    PLAN  Continue present medication oxycodone and Neurontin. Begin Ceftin antibiotic today as  prescribed and see  primary care physician for further evaluation of your condition as discussed    Lumbosacral selective nerve root block to be performed at time return appointment  F/U PCP Sharee Pimple or other PCP at facility for evaliation of  BP and general medical  condition. Please take Ceftin antibiotic as prescribed and see her primary care physician today or by tomorrow Tuesday, 05/26/2015 for further evaluation of your general condition  F/U surgical evaluation. Neurosurgical reevaluation with Dr. Dutch Quint as discussed  F/U neurological evaluation as discussed  May consider radiofrequency rhizolysis or intraspinal procedures pending response to present treatment and F/U evaluation.  Patient to call Pain Management Center should patient have concerns prior to scheduled return appointment

## 2015-05-25 NOTE — Progress Notes (Signed)
Safety precautions to be maintained throughout the outpatient stay will include: orient to surroundings, keep bed in low position, maintain call bell within reach at all times, provide assistance with transfer out of bed and ambulation.  

## 2015-05-25 NOTE — Patient Instructions (Addendum)
Continue present medication oxycodone and Neurontin. Begin Ceftin antibiotic today as prescribed and see primary care physician for further evaluation of your condition as discussed    Lumbosacral selective nerve root block to be performed at time return appointment  F/U PCP Sharee Pimple or other PCP at facility for evaliation of  BP and general medical  condition. Please take Ceftin antibiotic as prescribed and see her primary care physician today or by tomorrow Tuesday, 05/26/2015 for further evaluation of your general condition  F/U surgical evaluation. Neurosurgical reevaluation with Dr. Dutch Quint as discussed  F/U neurological evaluation as discussed  May consider radiofrequency rhizolysis or intraspinal procedures pending response to present treatment and F/U evaluation.  Patient to call [facility] should patient have concerns prior to scheduled return appointmentPain Management Discharge Instructions  General Discharge Instructions :  If you need to reach your doctor call: Monday-Friday 8:00 am - 4:00 pm at [phone] or toll free [phone].  After clinic hours [phone] to have operator reach doctor.  Bring all of your medication bottles to all your appointments in the pain clinic.  To cancel or reschedule your appointment with Pain Management please remember to call 24 hours in advance to avoid a fee.  Refer to the educational materials which you have been given on: General Risks, I had my Procedure. Discharge Instructions, Post Sedation.  Post Procedure Instructions:  The drugs you were given will stay in your system until tomorrow, so for the next 24 hours you should not drive, make any legal decisions or drink any alcoholic beverages.  You may eat anything you prefer, but it is better to start with liquids then soups and crackers, and gradually work up to solid foods.  Please notify your doctor immediately if you have any unusual bleeding, trouble  breathing or pain that is not related to your normal pain.  Depending on the type of procedure that was done, some parts of your body may feel week and/or numb.  This usually clears up by tonight or the next day.  Walk with the use of an assistive device or accompanied by an adult for the 24 hours.  You may use ice on the affected area for the first 24 hours.  Put ice in a Ziploc bag and cover with a towel and place against area 15 minutes on 15 minutes off.  You may switch to heat after 24 hours.Selective Nerve Root Block Patient Information  Description: Specific nerve roots exit the spinal canal and these nerves can be compressed and inflamed by a bulging disc and bone spurs.  By injecting steroids on the nerve root, we can potentially decrease the inflammation surrounding these nerves, which often leads to decreased pain.  Also, by injecting local anesthesia on the nerve root, this can provide [place] helpful information to give to your referring doctor if it decreases your pain.  Selective nerve root blocks can be done along the spine from the neck to the low back depending on the location of your pain.   After numbing the skin with local anesthesia, a small needle is passed to the nerve root and the position of the needle is verified using x-ray pictures.  After the needle is in correct position, we then deposit the medication.  You may experience a pressure sensation while this is being done.  The entire block usually lasts less than 15 minutes.  Conditions that may be treated with selective nerve root blocks:  Low back and leg pain  Spinal  stenosis  Diagnostic block prior to potential surgery  Neck and arm pain  Post laminectomy syndrome  Preparation for the injection:  1. Do not eat any solid food or dairy products within 6 hours of your appointment. 2. You may drink clear liquids up to 2 hours before an appointment.  Clear liquids include water, black coffee, juice or soda.  No milk  or cream please. 3. You may take your regular medications, including pain medications, with a sip of water before your appointment.  Diabetics should hold regular insulin (if taken separately) and take 1/2 normal NPH dose the morning of the procedure.  Carry some sugar containing items with you to your appointment. 4. A driver must accompany you and be prepared to drive you home after your procedure. 5. Bring all your current medications with you. 6. An IV may be inserted and sedation may be given at the discretion of the physician. 7. A blood pressure cuff, EKG, and other monitors will often be applied during the procedure.  Some patients may need to have extra oxygen administered for a short period. 8. You will be asked to provide medical information, including allergies, prior to the procedure.  We must know immediately if you are taking blood  Thinners (like Coumadin) or if you are allergic to IV iodine contrast (dye).  Possible side-effects: All are usually temporary  Bleeding from needle site  Light headedness  Numbness and tingling  Decreased blood pressure  Weakness in arms/legs  Pressure sensation in back/neck  Pain at injection site (several days)  Possible complications: All are extremely rare  Infection  Nerve injury  Spinal headache (a headache wore with upright position)  Call if you experience:  Fever/chills associated with headache or increased back/neck pain  Headache worsened by an upright position  New onset weakness or numbness of an extremity below the injection site  Hives or difficulty breathing (go to the emergency room)  Inflammation or drainage at the injection site(s)  Severe back/neck pain greater than usual  New symptoms which are concerning to you  Please note:  Although the local anesthetic injected can often make your back or neck feel good for several hours after the injection the pain will likely return.  It takes 3-5 days for  steroids to work on the nerve root. You may not notice any pain relief for at least one week.  If effective, we will often do a series of 3 injections spaced 3-6 weeks apart to maximally decrease your pain.    If you have any questions, please call [phone] [facility] ClinicGENERAL RISKS AND COMPLICATIONS  What are the risk, side effects and possible complications? Generally speaking, most procedures are safe.  However, with any procedure there are risks, side effects, and the possibility of complications.  The risks and complications are dependent upon the sites that are lesioned, or the type of nerve block to be performed.  The closer the procedure is to the spine, the more serious the risks are.  Great care is taken when placing the radio frequency needles, block needles or lesioning probes, but sometimes complications can occur. 1. Infection: Any time there is an injection through the skin, there is a risk of infection.  This is why sterile conditions are used for these blocks.  There are four possible types of infection. 1. Localized skin infection. 2. Central Nervous System Infection-This can be in the form of Meningitis, which can be deadly. 3. Epidural Infections-This can  be in the form of an epidural abscess, which can cause pressure inside of the spine, causing compression of the spinal cord with subsequent paralysis. This would require an emergency surgery to decompress, and there are no guarantees that the patient would recover from the paralysis. 4. Discitis-This is an infection of the intervertebral discs.  It occurs in about 1% of discography procedures.  It is difficult to treat and it may lead to surgery.        2. Pain: the needles have to go through skin and soft tissues, will cause soreness.       3. Damage to internal structures:  The nerves to be lesioned may be near blood vessels or    other nerves which can be potentially damaged.        4. Bleeding: Bleeding is more common if the patient is taking blood thinners such as  aspirin, Coumadin, Ticiid, Plavix, etc., or if he/she have some genetic predisposition  such as hemophilia. Bleeding into the spinal canal can cause compression of the spinal  cord with subsequent paralysis.  This would require an emergency surgery to  decompress and there are no guarantees that the patient would recover from the  paralysis.       5. Pneumothorax:  Puncturing of a lung is a possibility, every time a needle is introduced in  the area of the chest or upper back.  Pneumothorax refers to free air around the  collapsed lung(s), inside of the thoracic cavity (chest cavity).  Another two possible  complications related to a similar event would include: Hemothorax and Chylothorax.   These are variations of the Pneumothorax, where instead of air around the collapsed  lung(s), you may have blood or chyle, respectively.       6. Spinal headaches: They may occur with any procedures in the area of the spine.       7. Persistent CSF (Cerebro-Spinal Fluid) leakage: This is a rare problem, but may occur  with prolonged intrathecal or epidural catheters either due to the formation of a fistulous  track or a dural tear.       8. Nerve damage: By working so close to the spinal cord, there is always a possibility of  nerve damage, which could be as serious as a permanent spinal cord injury with  paralysis.       9. Death:  Although rare, severe deadly allergic reactions known as "Anaphylactic  reaction" can occur to any of the medications used.      10. Worsening of the symptoms:  We can always make thing worse.  What are the chances of something like this happening? Chances of any of this occuring are extremely low.  By statistics, you have more of a chance of getting killed in a motor vehicle accident: while driving to the hospital than any of the above occurring .  Nevertheless, you should be aware that they are  possibilities.  In general, it is similar to taking a shower.  Everybody knows that you can slip, hit your head and get killed.  Does that mean that you should not shower again?  Nevertheless always keep in mind that statistics do not mean anything if you happen to be on the wrong side of them.  Even if a procedure has a 1 (one) in a 1,000,000 (million) chance of going wrong, it you happen to be that one..Also, keep in mind that by statistics, you have more of a chance of having  something go wrong when taking medications.  Who should not have this procedure? If you are on a blood thinning medication (e.g. Coumadin, Plavix, see list of "Blood Thinners"), or if you have an active infection going on, you should not have the procedure.  If you are taking any blood thinners, please inform your physician.  How should I prepare for this procedure?  Do not eat or drink anything at least six hours prior to the procedure.  Bring a driver with you .  It cannot be a taxi.  Come accompanied by an adult that can drive you back, and that is strong enough to help you if your legs get weak or numb from the local anesthetic.  Take all of your medicines the morning of the procedure with just enough water to swallow them.  If you have diabetes, make sure that you are scheduled to have your procedure done first thing in the morning, whenever possible.  If you have diabetes, take only half of your insulin dose and notify our nurse that you have done so as soon as you arrive at the clinic.  If you are diabetic, but only take blood sugar pills (oral hypoglycemic), then do not take them on the morning of your procedure.  You may take them after you have had the procedure.  Do not take aspirin or any aspirin-containing medications, at least eleven (11) days prior to the procedure.  They may prolong bleeding.  Wear loose fitting clothing that may be easy to take off and that you would not mind if it got stained with  Betadine or blood.  Do not wear any jewelry or perfume  Remove any nail coloring.  It will interfere with some of our monitoring equipment.  NOTE: Remember that this is not meant to be interpreted as a complete list of all possible complications.  Unforeseen problems may occur.  BLOOD THINNERS The following drugs contain aspirin or other products, which can cause increased bleeding during surgery and should not be taken for 2 weeks prior to and 1 week after surgery.  If you should need take something for relief of minor pain, you may take acetaminophen which is found in Tylenol,m Datril, Anacin-3 and Panadol. It is not blood thinner. The products listed below are.  Do not take any of the products listed below in addition to any listed on your instruction sheet.  A.P.C or A.P.C with Codeine Codeine Phosphate Capsules #3 Ibuprofen Ridaura  ABC compound Congesprin Imuran rimadil  Advil Cope Indocin Robaxisal  Alka-Seltzer Effervescent Pain Reliever and Antacid Coricidin or Coricidin-D  Indomethacin Rufen  Alka-Seltzer plus Cold Medicine Cosprin Ketoprofen S-A-C Tablets  Anacin Analgesic Tablets or Capsules Coumadin Korlgesic Salflex  Anacin Extra Strength Analgesic tablets or capsules CP-2 Tablets Lanoril Salicylate  Anaprox Cuprimine Capsules Levenox Salocol  Anexsia-D Dalteparin Magan Salsalate  Anodynos Darvon compound Magnesium Salicylate Sine-off  Ansaid Dasin Capsules Magsal Sodium Salicylate  Anturane Depen Capsules Marnal Soma  APF Arthritis pain formula Dewitt's Pills Measurin Stanback  Argesic Dia-Gesic Meclofenamic Sulfinpyrazone  Arthritis Bayer Timed Release Aspirin Diclofenac Meclomen Sulindac  Arthritis pain formula Anacin Dicumarol Medipren Supac  Analgesic (Safety coated) Arthralgen Diffunasal Mefanamic Suprofen  Arthritis Strength Bufferin Dihydrocodeine Mepro Compound Suprol  Arthropan liquid Dopirydamole Methcarbomol with Aspirin Synalgos  ASA tablets/Enseals Disalcid  Micrainin Tagament  Ascriptin Doan's Midol Talwin  Ascriptin A/D Dolene Mobidin Tanderil  Ascriptin Extra Strength Dolobid Moblgesic Ticlid  Ascriptin with Codeine Doloprin or Doloprin with Codeine Momentum Tolectin  Asperbuf Duoprin Mono-gesic Trendar  Aspergum Duradyne Motrin or Motrin IB Triminicin  Aspirin plain, buffered or enteric coated Durasal Myochrisine Trigesic  Aspirin Suppositories Easprin Nalfon Trillsate  Aspirin with Codeine Ecotrin Regular or Extra Strength Naprosyn Uracel  Atromid-S Efficin Naproxen Ursinus  Auranofin Capsules Elmiron Neocylate Vanquish  Axotal Emagrin Norgesic Verin  Azathioprine Empirin or Empirin with Codeine Normiflo Vitamin E  Azolid Emprazil Nuprin Voltaren  Bayer Aspirin plain, buffered or children's or timed BC Tablets or powders Encaprin Orgaran Warfarin Sodium  Buff-a-Comp Enoxaparin Orudis Zorpin  Buff-a-Comp with Codeine Equegesic Os-Cal-Gesic   Buffaprin Excedrin plain, buffered or Extra Strength Oxalid   Bufferin Arthritis Strength Feldene Oxphenbutazone   Bufferin plain or Extra Strength Feldene Capsules Oxycodone with Aspirin   Bufferin with Codeine Fenoprofen Fenoprofen Pabalate or Pabalate-SF   Buffets II Flogesic Panagesic   Buffinol plain or Extra Strength Florinal or Florinal with Codeine Panwarfarin   Buf-Tabs Flurbiprofen Penicillamine   Butalbital Compound Four-way cold tablets Penicillin   Butazolidin Fragmin Pepto-Bismol   Carbenicillin Geminisyn Percodan   Carna Arthritis Reliever Geopen Persantine   Carprofen Gold's salt Persistin   Chloramphenicol Goody's Phenylbutazone   Chloromycetin Haltrain Piroxlcam   Clmetidine heparin Plaquenil   Cllnoril Hyco-pap Ponstel   Clofibrate Hydroxy chloroquine Propoxyphen         Before stopping any of these medications, be sure to consult the physician who ordered them.  Some, such as Coumadin (Warfarin) are ordered to prevent or treat serious conditions such as "deep thrombosis",  "pumonary embolisms", and other heart problems.  The amount of time that you may need off of the medication may also vary with the medication and the reason for which you were taking it.  If you are taking any of these medications, please make sure you notify your pain physician before you undergo any procedures.

## 2015-05-27 ENCOUNTER — Telehealth: Payer: Self-pay | Admitting: Pain Medicine

## 2015-05-27 ENCOUNTER — Other Ambulatory Visit: Payer: Self-pay | Admitting: Pain Medicine

## 2015-05-27 MED ORDER — CEFUROXIME AXETIL 500 MG PO TABS: ORAL_TABLET | ORAL | Status: DC

## 2015-05-27 NOTE — Telephone Encounter (Signed)
[facility] in [place] does not understand script sent in for antibiotics please call to verify

## 2015-05-27 NOTE — Telephone Encounter (Signed)
Prescription verified with Dr. Metta Clines, pharmacy called.

## 2015-06-03 ENCOUNTER — Encounter: Payer: Self-pay | Admitting: Pain Medicine

## 2015-06-03 ENCOUNTER — Ambulatory Visit: Payer: Medicare Other | Attending: Pain Medicine | Admitting: Pain Medicine

## 2015-06-03 VITALS — BP 112/87 | HR 95 | Temp 98.1°F | Resp 18 | Ht 66.0 in | Wt 395.0 lb

## 2015-06-03 DIAGNOSIS — M5126 Other intervertebral disc displacement, lumbar region: Secondary | ICD-10-CM | POA: Diagnosis not present

## 2015-06-03 DIAGNOSIS — M5416 Radiculopathy, lumbar region: Secondary | ICD-10-CM

## 2015-06-03 DIAGNOSIS — M79606 Pain in leg, unspecified: Secondary | ICD-10-CM | POA: Diagnosis present

## 2015-06-03 DIAGNOSIS — M5136 Other intervertebral disc degeneration, lumbar region: Secondary | ICD-10-CM | POA: Insufficient documentation

## 2015-06-03 DIAGNOSIS — M545 Low back pain: Secondary | ICD-10-CM | POA: Diagnosis present

## 2015-06-03 DIAGNOSIS — M5030 Other cervical disc degeneration, unspecified cervical region: Secondary | ICD-10-CM

## 2015-06-03 DIAGNOSIS — M5020 Other cervical disc displacement, unspecified cervical region: Secondary | ICD-10-CM

## 2015-06-03 DIAGNOSIS — M47816 Spondylosis without myelopathy or radiculopathy, lumbar region: Secondary | ICD-10-CM | POA: Diagnosis not present

## 2015-06-03 DIAGNOSIS — M5000 Cervical disc disorder with myelopathy, unspecified cervical region: Secondary | ICD-10-CM

## 2015-06-03 MED ORDER — ORPHENADRINE CITRATE 30 MG/ML IJ SOLN
INTRAMUSCULAR | Status: AC
  Filled 2015-06-03: qty 2

## 2015-06-03 MED ORDER — FENTANYL CITRATE (PF) 100 MCG/2ML IJ SOLN: 100.0000 ug | Freq: Once | INTRAMUSCULAR | Status: DC

## 2015-06-03 MED ORDER — CEFUROXIME AXETIL 250 MG PO TABS: 250.0000 mg | ORAL_TABLET | Freq: Two times a day (BID) | ORAL | Status: DC

## 2015-06-03 MED ORDER — SODIUM CHLORIDE 0.9 % IJ SOLN
INTRAMUSCULAR | Status: AC
  Filled 2015-06-03: qty 10

## 2015-06-03 MED ORDER — MIDAZOLAM HCL 5 MG/5ML IJ SOLN: 5.0000 mg | Freq: Once | INTRAMUSCULAR | Status: DC

## 2015-06-03 MED ORDER — TRIAMCINOLONE ACETONIDE 40 MG/ML IJ SUSP
INTRAMUSCULAR | Status: AC
  Filled 2015-06-03: qty 1

## 2015-06-03 MED ORDER — LIDOCAINE HCL (PF) 1 % IJ SOLN
INTRAMUSCULAR | Status: AC
  Filled 2015-06-03: qty 10

## 2015-06-03 MED ORDER — TRIAMCINOLONE ACETONIDE 40 MG/ML IJ SUSP
INTRAMUSCULAR | Status: AC
  Administered 2015-06-03: 10:00:00
  Filled 2015-06-03: qty 1

## 2015-06-03 MED ORDER — BUPIVACAINE HCL (PF) 0.25 % IJ SOLN: 30.0000 mL | Freq: Once | INTRAMUSCULAR | Status: DC

## 2015-06-03 MED ORDER — SODIUM CHLORIDE 0.9 % IJ SOLN
INTRAMUSCULAR | Status: AC
  Filled 2015-06-03: qty 20

## 2015-06-03 MED ORDER — TRIAMCINOLONE ACETONIDE 40 MG/ML IJ SUSP: 40.0000 mg | Freq: Once | INTRAMUSCULAR | Status: DC

## 2015-06-03 MED ORDER — ORPHENADRINE CITRATE 30 MG/ML IJ SOLN: 60.0000 mg | Freq: Once | INTRAMUSCULAR | Status: DC

## 2015-06-03 MED ORDER — BUPIVACAINE HCL (PF) 0.25 % IJ SOLN
INTRAMUSCULAR | Status: AC
  Administered 2015-06-03: 10:00:00
  Filled 2015-06-03: qty 30

## 2015-06-03 MED ORDER — CEFAZOLIN SODIUM 1 G IJ SOLR
INTRAMUSCULAR | Status: AC
  Administered 2015-06-03: 1 g via INTRAVENOUS
  Filled 2015-06-03: qty 10

## 2015-06-03 MED ORDER — LACTATED RINGERS IV SOLN: 1000.0000 mL | INTRAVENOUS | Status: DC

## 2015-06-03 MED ORDER — CEFAZOLIN SODIUM 1-5 GM-% IV SOLN: 1.0000 g | Freq: Once | INTRAVENOUS | Status: DC

## 2015-06-03 NOTE — Progress Notes (Signed)
Subjective:    Patient ID: Tiffany Gomez, female    DOB: 1965/09/23, 50 y.o.   MRN: [id number]  HPI  PROCEDURE PERFORMED: Lumbosacral selective nerve root block   NOTE: The patient is a 50 y.o. female who returns to [facility] for further evaluation and treatment of pain involving the lumbar and lower extremity region. Studies consisting of MRI has revealed the patient to be with evidence of degenerative disc disease lumbar spine with  mild to moderate multilevel degenerative changes with facet arthrosis worse at L5-S1 without significant spinal canal of neural foraminal stenosis mild asymmetric enlargement of the right L5 nerve root which could reflect a radiculitis, L5-S1 mild bulging disc with symptoms small central disc protrusion. L4-5 facet arthrosis. L3-4 disc bulging with facet arthropathy. L2-3 left foraminal/extra foraminal disc protrusion slightly displaces the exiting L2 nerve root. L1-2 shallow left foraminal/extra foraminal disc protrusion. There is concern regarding significant component of patient's pain began due to lumbar radiculopathy  The risks, benefits, and expectations of the procedure have been explained to the patient who was understanding and in agreement with suggested treatment plan. We will proceed with interventional treatment as discussed and as explained to the patient. The patient is understanding and in agreement with suggested treatment plan.   DESCRIPTION OF PROCEDURE: Lumbosacral selective nerve root block with EKG, blood pressure, pulse, and pulse oximetry monitoring. The procedure was performed with the patient in the prone position under fluoroscopic guidance. With the patient in the prone position, Betadine prep of proposed entry site was performed. Local anesthetic skin wheal of proposed needle entry site was prepared with 1.5% plain lidocaine with AP view of the lumbosacral spine.   PROCEDURE #1: Needle placement at the Right  Lto  vertebral  body: A 22 -gauge needle was inserted at the inferior border of the transverse process of the vertebral body with needle placed medial to the midline of the transverse process on AP view of the lumbosacral spine.   NEEDLE PLACEMENT AT  L3, L4, and L5  VERTEBRAL BODY LEVELS  Needle  placement was accomplished at L3, L4, and L5  vertebral body levels on the right  side exactly as was accomplished at the L2  vertebral body level  and utilizing the same technique and under fluoroscopic guidance.   Needle placement was then verified on lateral view at all levels with needle tip documented to be in the posterior superior quadrant of the intervertebral foramen of  L to, L3, L4, and L5.  Following negative aspiration for heme and CSF at each level, each level was injected with 3 mL of  preservative-free normal saline with Kenalog.    The patient tolerated the procedure well. A total of 10 mg of Kenalog was utilized for the procedure.   PLAN:  1. Medications: Will continue presently prescribed medications oxycodone and Neurontin. 2. The patient is to undergo follow-up evaluation with PCP E White or other primary care physician  for evaluation of blood pressure and general medical condition status post procedure performed on today's visit. 3. Surgical follow-up evaluation.Patient will follow-up with Dr. Dutch Quint as discussed  4. Neurological evaluation.May consider [facility] EMG studies and other studies  5. May consider radiofrequency procedures, implantation type procedures and other treatment pending response to treatment and follow-up evaluation. 6. The patient has been advise do adhere to proper body mechanics and avoid activities which may aggravate condition. 7. The patient has been advised to call the Pain Management Center prior to scheduled  return appointment should there be significant change in the patient's condition or should the patient have other concerns regarding condition prior to scheduled  return appointment.   Review of Systems     Objective:   Physical Exam        Assessment & Plan:

## 2015-06-03 NOTE — Patient Instructions (Addendum)
Continue present medication oxycodone and Neurontin  Please get Ceftin antibiotic today and begin taking Ceftin antibiotic today as prescribed  F/U PCP Sharee Pimple or other PCP at facility for evaliation of blood pressure and general medical condition  F/U surgical evaluation. Neurosurgical reevaluation with Dr. Dutch Quint as discussed  F/U neurological evaluation as discussed  May consider radiofrequency rhizolysis or intraspinal procedures pending response to present treatment and F/U evaluation.  Patient to call [facility] should patient have concerns prior to scheduled return appointmentGENERAL RISKS AND COMPLICATIONS  What are the risk, side effects and possible complications? Generally speaking, most procedures are safe.  However, with any procedure there are risks, side effects, and the possibility of complications.  The risks and complications are dependent upon the sites that are lesioned, or the type of nerve block to be performed.  The closer the procedure is to the spine, the more serious the risks are.  Great care is taken when placing the radio frequency needles, block needles or lesioning probes, but sometimes complications can occur. 1. Infection: Any time there is an injection through the skin, there is a risk of infection.  This is why sterile conditions are used for these blocks.  There are four possible types of infection. 1. Localized skin infection. 2. Central Nervous System Infection-This can be in the form of Meningitis, which can be deadly. 3. Epidural Infections-This can be in the form of an epidural abscess, which can cause pressure inside of the spine, causing compression of the spinal cord with subsequent paralysis. This would require an emergency surgery to decompress, and there are no guarantees that the patient would recover from the paralysis. 4. Discitis-This is an infection of the intervertebral discs.  It occurs in about 1% of discography procedures.   It is difficult to treat and it may lead to surgery.        2. Pain: the needles have to go through skin and soft tissues, will cause soreness.       3. Damage to internal structures:  The nerves to be lesioned may be near blood vessels or    other nerves which can be potentially damaged.       4. Bleeding: Bleeding is more common if the patient is taking blood thinners such as  aspirin, Coumadin, Ticiid, Plavix, etc., or if he/she have some genetic predisposition  such as hemophilia. Bleeding into the spinal canal can cause compression of the spinal  cord with subsequent paralysis.  This would require an emergency surgery to  decompress and there are no guarantees that the patient would recover from the  paralysis.       5. Pneumothorax:  Puncturing of a lung is a possibility, every time a needle is introduced in  the area of the chest or upper back.  Pneumothorax refers to free air around the  collapsed lung(s), inside of the thoracic cavity (chest cavity).  Another two possible  complications related to a similar event would include: Hemothorax and Chylothorax.   These are variations of the Pneumothorax, where instead of air around the collapsed  lung(s), you may have blood or chyle, respectively.       6. Spinal headaches: They may occur with any procedures in the area of the spine.       7. Persistent CSF (Cerebro-Spinal Fluid) leakage: This is a rare problem, but may occur  with prolonged intrathecal or epidural catheters either due to the formation of a fistulous  track or a  dural tear.       8. Nerve damage: By working so close to the spinal cord, there is always a possibility of  nerve damage, which could be as serious as a permanent spinal cord injury with  paralysis.       9. Death:  Although rare, severe deadly allergic reactions known as "Anaphylactic  reaction" can occur to any of the medications used.      10. Worsening of the symptoms:  We can always make thing worse.  What are the  chances of something like this happening? Chances of any of this occuring are extremely low.  By statistics, you have more of a chance of getting killed in a motor vehicle accident: while driving to the hospital than any of the above occurring .  Nevertheless, you should be aware that they are possibilities.  In general, it is similar to taking a shower.  Everybody knows that you can slip, hit your head and get killed.  Does that mean that you should not shower again?  Nevertheless always keep in mind that statistics do not mean anything if you happen to be on the wrong side of them.  Even if a procedure has a 1 (one) in a 1,000,000 (million) chance of going wrong, it you happen to be that one..Also, keep in mind that by statistics, you have more of a chance of having something go wrong when taking medications.  Who should not have this procedure? If you are on a blood thinning medication (e.g. Coumadin, Plavix, see list of "Blood Thinners"), or if you have an active infection going on, you should not have the procedure.  If you are taking any blood thinners, please inform your physician.  How should I prepare for this procedure?  Do not eat or drink anything at least six hours prior to the procedure.  Bring a driver with you .  It cannot be a taxi.  Come accompanied by an adult that can drive you back, and that is strong enough to help you if your legs get weak or numb from the local anesthetic.  Take all of your medicines the morning of the procedure with just enough water to swallow them.  If you have diabetes, make sure that you are scheduled to have your procedure done first thing in the morning, whenever possible.  If you have diabetes, take only half of your insulin dose and notify our nurse that you have done so as soon as you arrive at the clinic.  If you are diabetic, but only take blood sugar pills (oral hypoglycemic), then do not take them on the morning of your procedure.  You may  take them after you have had the procedure.  Do not take aspirin or any aspirin-containing medications, at least eleven (11) days prior to the procedure.  They may prolong bleeding.  Wear loose fitting clothing that may be easy to take off and that you would not mind if it got stained with Betadine or blood.  Do not wear any jewelry or perfume  Remove any nail coloring.  It will interfere with some of our monitoring equipment.  NOTE: Remember that this is not meant to be interpreted as a complete list of all possible complications.  Unforeseen problems may occur.  BLOOD THINNERS The following drugs contain aspirin or other products, which can cause increased bleeding during surgery and should not be taken for 2 weeks prior to and 1 week after surgery.  If you  should need take something for relief of minor pain, you may take acetaminophen which is found in Tylenol,m Datril, Anacin-3 and Panadol. It is not blood thinner. The products listed below are.  Do not take any of the products listed below in addition to any listed on your instruction sheet.  A.P.C or A.P.C with Codeine Codeine Phosphate Capsules #3 Ibuprofen Ridaura  ABC compound Congesprin Imuran rimadil  Advil Cope Indocin Robaxisal  Alka-Seltzer Effervescent Pain Reliever and Antacid Coricidin or Coricidin-D  Indomethacin Rufen  Alka-Seltzer plus Cold Medicine Cosprin Ketoprofen S-A-C Tablets  Anacin Analgesic Tablets or Capsules Coumadin Korlgesic Salflex  Anacin Extra Strength Analgesic tablets or capsules CP-2 Tablets Lanoril Salicylate  Anaprox Cuprimine Capsules Levenox Salocol  Anexsia-D Dalteparin Magan Salsalate  Anodynos Darvon compound Magnesium Salicylate Sine-off  Ansaid Dasin Capsules Magsal Sodium Salicylate  Anturane Depen Capsules Marnal Soma  APF Arthritis pain formula Dewitt's Pills Measurin Stanback  Argesic Dia-Gesic Meclofenamic Sulfinpyrazone  Arthritis Bayer Timed Release Aspirin Diclofenac Meclomen  Sulindac  Arthritis pain formula Anacin Dicumarol Medipren Supac  Analgesic (Safety coated) Arthralgen Diffunasal Mefanamic Suprofen  Arthritis Strength Bufferin Dihydrocodeine Mepro Compound Suprol  Arthropan liquid Dopirydamole Methcarbomol with Aspirin Synalgos  ASA tablets/Enseals Disalcid Micrainin Tagament  Ascriptin Doan's Midol Talwin  Ascriptin A/D Dolene Mobidin Tanderil  Ascriptin Extra Strength Dolobid Moblgesic Ticlid  Ascriptin with Codeine Doloprin or Doloprin with Codeine Momentum Tolectin  Asperbuf Duoprin Mono-gesic Trendar  Aspergum Duradyne Motrin or Motrin IB Triminicin  Aspirin plain, buffered or enteric coated Durasal Myochrisine Trigesic  Aspirin Suppositories Easprin Nalfon Trillsate  Aspirin with Codeine Ecotrin Regular or Extra Strength Naprosyn Uracel  Atromid-S Efficin Naproxen Ursinus  Auranofin Capsules Elmiron Neocylate Vanquish  Axotal Emagrin Norgesic Verin  Azathioprine Empirin or Empirin with Codeine Normiflo Vitamin E  Azolid Emprazil Nuprin Voltaren  Bayer Aspirin plain, buffered or children's or timed BC Tablets or powders Encaprin Orgaran Warfarin Sodium  Buff-a-Comp Enoxaparin Orudis Zorpin  Buff-a-Comp with Codeine Equegesic Os-Cal-Gesic   Buffaprin Excedrin plain, buffered or Extra Strength Oxalid   Bufferin Arthritis Strength Feldene Oxphenbutazone   Bufferin plain or Extra Strength Feldene Capsules Oxycodone with Aspirin   Bufferin with Codeine Fenoprofen Fenoprofen Pabalate or Pabalate-SF   Buffets II Flogesic Panagesic   Buffinol plain or Extra Strength Florinal or Florinal with Codeine Panwarfarin   Buf-Tabs Flurbiprofen Penicillamine   Butalbital Compound Four-way cold tablets Penicillin   Butazolidin Fragmin Pepto-Bismol   Carbenicillin Geminisyn Percodan   Carna Arthritis Reliever Geopen Persantine   Carprofen Gold's salt Persistin   Chloramphenicol Goody's Phenylbutazone   Chloromycetin Haltrain Piroxlcam   Clmetidine heparin  Plaquenil   Cllnoril Hyco-pap Ponstel   Clofibrate Hydroxy chloroquine Propoxyphen         Before stopping any of these medications, be sure to consult the physician who ordered them.  Some, such as Coumadin (Warfarin) are ordered to prevent or treat serious conditions such as "deep thrombosis", "pumonary embolisms", and other heart problems.  The amount of time that you may need off of the medication may also vary with the medication and the reason for which you were taking it.  If you are taking any of these medications, please make sure you notify your pain physician before you undergo any procedures.

## 2015-06-03 NOTE — Progress Notes (Signed)
Safety precautions to be maintained throughout the outpatient stay will include: orient to surroundings, keep bed in low position, maintain call bell within reach at all times, provide assistance with transfer out of bed and ambulation.  

## 2015-06-04 ENCOUNTER — Telehealth: Payer: Self-pay | Admitting: *Deleted

## 2015-06-04 ENCOUNTER — Encounter: Payer: Medicare Other | Admitting: Pain Medicine

## 2015-06-04 NOTE — Telephone Encounter (Signed)
Unable to reach patient at number provided

## 2015-06-18 ENCOUNTER — Encounter: Payer: Self-pay | Admitting: Pain Medicine

## 2015-06-18 ENCOUNTER — Ambulatory Visit: Payer: Medicare Other | Attending: Pain Medicine | Admitting: Pain Medicine

## 2015-06-18 VITALS — BP 133/71 | HR 106 | Temp 97.6°F | Resp 18 | Ht 66.0 in | Wt 375.0 lb

## 2015-06-18 DIAGNOSIS — M5116 Intervertebral disc disorders with radiculopathy, lumbar region: Secondary | ICD-10-CM | POA: Insufficient documentation

## 2015-06-18 DIAGNOSIS — M5030 Other cervical disc degeneration, unspecified cervical region: Secondary | ICD-10-CM | POA: Diagnosis not present

## 2015-06-18 DIAGNOSIS — M5416 Radiculopathy, lumbar region: Secondary | ICD-10-CM

## 2015-06-18 DIAGNOSIS — M47896 Other spondylosis, lumbar region: Secondary | ICD-10-CM | POA: Diagnosis not present

## 2015-06-18 DIAGNOSIS — M4806 Spinal stenosis, lumbar region: Secondary | ICD-10-CM | POA: Diagnosis not present

## 2015-06-18 DIAGNOSIS — Z9889 Other specified postprocedural states: Secondary | ICD-10-CM | POA: Diagnosis not present

## 2015-06-18 DIAGNOSIS — M546 Pain in thoracic spine: Secondary | ICD-10-CM | POA: Diagnosis present

## 2015-06-18 DIAGNOSIS — M5000 Cervical disc disorder with myelopathy, unspecified cervical region: Secondary | ICD-10-CM

## 2015-06-18 DIAGNOSIS — M47816 Spondylosis without myelopathy or radiculopathy, lumbar region: Secondary | ICD-10-CM

## 2015-06-18 DIAGNOSIS — M5126 Other intervertebral disc displacement, lumbar region: Secondary | ICD-10-CM | POA: Insufficient documentation

## 2015-06-18 DIAGNOSIS — M79606 Pain in leg, unspecified: Secondary | ICD-10-CM | POA: Diagnosis present

## 2015-06-18 DIAGNOSIS — M5020 Other cervical disc displacement, unspecified cervical region: Secondary | ICD-10-CM

## 2015-06-18 MED ORDER — OXYCODONE HCL 10 MG PO TABS: ORAL_TABLET | ORAL | Status: DC

## 2015-06-18 MED ORDER — GABAPENTIN 300 MG PO CAPS: ORAL_CAPSULE | ORAL | Status: DC

## 2015-06-18 NOTE — Progress Notes (Signed)
Safety precautions to be maintained throughout the outpatient stay will include: orient to surroundings, keep bed in low position, maintain call bell within reach at all times, provide assistance with transfer out of bed and ambulation.  

## 2015-06-18 NOTE — Patient Instructions (Addendum)
PLAN   Continue present medication oxycodone and Neurontin. CAUTION Neurontin and oxycodone can cause respiratory depression, excessive sedation, confusion, and other side effects. Exercise extreme caution when taking these medications to avoid undesirable side effects   F/U PCP [name] or other PCP at facility for evaliation of  BP and general medical  condition. Please take Ceftin antibiotic as prescribed and see her primary care physician today or by tomorrow Tuesday, 05/26/2015 for further evaluation of your general condition  F/U surgical evaluation. Neurosurgical reevaluation with Dr. Dutch QuintPoole as discussed  F/U neurological evaluation as discussed  May consider radiofrequency rhizolysis or intraspinal procedures pending response to present treatment and F/U evaluation.  Patient to call [facility] Center should patient have concerns prior to scheduled return appointment

## 2015-06-18 NOTE — Progress Notes (Signed)
Subjective:    Patient ID: Tiffany Gomez, female    DOB: Dec 11, 1965, 50 y.o.   MRN: [id number]  HPI  The patient is a 50 year old female who returns to pain management for further evaluation and treatment of pain involving the lower back and lower extremity region with pain involving the neck and upper extremity regions as well. The patient states that the lower back and lower extremity pain is most significant at this time. The patient is status post surgical intervention of the cervical region performed by Dr. Dutch Quint. At the present time the patient is without plans for surgical intervention of the lumbar region. The patient states that the lower back and lower extremity pain increases as patient spends time on the feet and that the pain becomes more intense as the day progresses. The patient admits to some numbness and tingling radiating from the lumbar region toward the lower extremity regions. The numbness and tingling is also more intense as patient spends more time on the feet. The patient denies any trauma change in events of daily living to cause significant change in symptomatology. We will consider patient for additional interventional treatment pending follow-up evaluations. At the present time we will continue oxycodone and patient will increase Neurontin provided patient is without drowsiness confusion excessive sedation and other undesirable side effects. The patient expressed understanding and was in agreement with suggested treatment plan. Patient will follow-up with Dr. pulled as well as discussed and as needed      Review of Systems     Objective:   Physical Exam  There was tenderness to palpation of the paraspinal muscular treat and cervical region cervical facet region palpation over the splenius capitis and occipitalis regions reproduce moderate discomfort. The patient was with limited range of motion of the cervical spine and appeared to be with unremarkable Spurling's  maneuver. There was well-healed surgical scar of the region of the cervical region without increased warmth and erythema in the region of the scar. Palpation of the acromioclavicular and glenohumeral joint regions reproduced pain of moderate degree. Palpation over the thoracic facet thoracic paraspinal misreading was evidence of moderate muscle spasm. Palpation over the lumbar paraspinal must reason lumbar facet region was attends to palpation of moderate degree. Lateral bending rotation extension and palpation of the lumbar facets reproduce moderate discomfort. Straight leg raise was tolerates approximately 20 without a definite increase of pain with dorsiflexion noted. EHL strength appeared to be slightly decreased. No definite sensory deficit of dermatomal distribution was detected. There was tenderness over the PSIS and PII S region a moderate degree with mild tenderness along the greater trochanteric region and iliotibial band region. DTRs appeared to be trace at the knees. There was negative clonus negative Homans. Abdomen was protuberant and no costovertebral tenderness was noted.           Assessment & Plan:     Degenerative disc disease lumbar spine Mild to moderate multilevel degenerative changes with facet arthrosis worse at L5-S1 without significant spinal canal of neural foraminal stenosis mild asymmetric enlargement of the right L5 nerve root which could reflect a radiculitis, L5-S1 mild bulging disc with symptoms small central disc protrusion. L4-5 facet arthrosis. L3-4 disc bulging with facet arthropathy. L2-3 left foraminal/extra foraminal disc protrusion slightly displaces the exiting L2 nerve root. L1-2 shallow left foraminal/extra foraminal disc protrusion  Lumbar stenosis with neurogenic claudication  Lumbar radiculopathy  Lumbar facet syndrome  Degenerative disc disease cervical spine  Status post surgery of the cervical  region      PLAN   Continue present  medication oxycodone and Neurontin. CAUTION Neurontin and oxycodone can cause respiratory depression, excessive sedation, confusion, and other side effects. Exercise extreme caution when taking these medications to avoid undesirable side effects   F/U PCP [name] or other PCP at facility for evaliation of  BP and general medical  condition. Please take Ceftin antibiotic as prescribed and see her primary care physician today or by tomorrow Tuesday, 05/26/2015 for further evaluation of your general condition  F/U surgical evaluation. Neurosurgical reevaluation with Dr. Dutch QuintPoole as discussed  F/U neurological evaluation as discussed  May consider radiofrequency rhizolysis or intraspinal procedures pending response to present treatment and F/U evaluation.  Patient to call Pain Management Center should patient have concerns prior to scheduled return appointment

## 2015-06-22 ENCOUNTER — Encounter: Payer: Medicare Other | Admitting: Pain Medicine

## 2015-07-02 ENCOUNTER — Other Ambulatory Visit: Payer: Self-pay | Admitting: Pain Medicine

## 2015-07-27 ENCOUNTER — Ambulatory Visit: Payer: Medicare Other | Attending: Pain Medicine | Admitting: Pain Medicine

## 2015-07-27 ENCOUNTER — Encounter: Payer: Self-pay | Admitting: Pain Medicine

## 2015-07-27 VITALS — BP 116/98 | HR 108 | Temp 98.3°F | Resp 18 | Ht 66.0 in | Wt 383.0 lb

## 2015-07-27 DIAGNOSIS — M5126 Other intervertebral disc displacement, lumbar region: Secondary | ICD-10-CM | POA: Insufficient documentation

## 2015-07-27 DIAGNOSIS — M47816 Spondylosis without myelopathy or radiculopathy, lumbar region: Secondary | ICD-10-CM

## 2015-07-27 DIAGNOSIS — M5116 Intervertebral disc disorders with radiculopathy, lumbar region: Secondary | ICD-10-CM | POA: Insufficient documentation

## 2015-07-27 DIAGNOSIS — M47896 Other spondylosis, lumbar region: Secondary | ICD-10-CM | POA: Insufficient documentation

## 2015-07-27 DIAGNOSIS — M5020 Other cervical disc displacement, unspecified cervical region: Secondary | ICD-10-CM

## 2015-07-27 DIAGNOSIS — M5000 Cervical disc disorder with myelopathy, unspecified cervical region: Secondary | ICD-10-CM

## 2015-07-27 DIAGNOSIS — Z9889 Other specified postprocedural states: Secondary | ICD-10-CM | POA: Insufficient documentation

## 2015-07-27 DIAGNOSIS — M5030 Other cervical disc degeneration, unspecified cervical region: Secondary | ICD-10-CM | POA: Insufficient documentation

## 2015-07-27 DIAGNOSIS — M5416 Radiculopathy, lumbar region: Secondary | ICD-10-CM

## 2015-07-27 DIAGNOSIS — M4806 Spinal stenosis, lumbar region: Secondary | ICD-10-CM | POA: Insufficient documentation

## 2015-07-27 MED ORDER — GABAPENTIN 300 MG PO CAPS: ORAL_CAPSULE | ORAL | Status: DC

## 2015-07-27 MED ORDER — OXYCODONE HCL 10 MG PO TABS: ORAL_TABLET | ORAL | Status: DC

## 2015-07-27 NOTE — Progress Notes (Signed)
Safety precautions to be maintained throughout the outpatient stay will include: orient to surroundings, keep bed in low position, maintain call bell within reach at all times, provide assistance with transfer out of bed and ambulation.  

## 2015-07-27 NOTE — Patient Instructions (Addendum)
PLAN   Continue present medication oxycodone . Caution oxycodone can cause respiratory depression, excessive sedation, confusion and other side effects  F/U PCP [name] or other PCP at facility for evaliation of  BP and general medical  condition.   F/U surgical evaluation. Neurosurgical reevaluation with Dr. Dutch QuintPoole as discussed  F/U neurological evaluation as discussed  May consider radiofrequency rhizolysis or intraspinal procedures pending response to present treatment and F/U evaluation.  Patient to call Pain Management Center should patient have concerns prior to scheduled return appointment

## 2015-07-27 NOTE — Progress Notes (Signed)
Subjective:    Patient ID: Tiffany Gomez, female    DOB: [birth year], 50 y.o.   MRN: [id number]  HPI    The patient is a 50 year old female who returns to pain management for further evaluation and treatment of pain involving the neck upper extremity regions entire back lower back lower extremity regions. The patient states that she recently fell sustaining trauma to the left knee. The patient denies any subsequent severely disabling symptoms as a result fall. The patient states that she is with pain fairly well-controlled at this time and continues oxycodone as prescribed. We have discussed additional treatment at the present time patient has been unable to tolerate Neurontin. We will consider other medications as discussed with patient. At the present time we will continue oxycodone and we will consider modifications of treatment regimen at time of return appointment. The patient is to call pain management prior to scheduled return appointment  if there is any significant change in her condition. All agreed to suggested treatment plan   Review of Systems     Objective:   Physical Exam  There was tenderness of the cervical facet cervical paraspinal musculature region with well-healed scar of the cervical region without increased warmth and erythema in the region of the scar. Palpation of the acromioclavicular and glenohumeral joint regions reproduce mild discomfort. The patient appeared to be with slightly decreased grip strength and Tinel and Phalen's maneuver were without increase of pain of significant degree. Palpation over the thoracic facet thoracic paraspinal muscular region was associated with tends to palpation of moderate degree with no crepitus of the thoracic region noted. Palpation over the lumbar paraspinal musculature region lumbar facet region was attends to palpation of moderate degree with lateral bending rotation extension and palpation over the lumbar facets reproducing  moderate discomfort. There was tenderness over the PSIS and PII S region a moderate degree and moderate tenderness over the gluteal and piriformis musculature region with mild tenderness of the greater trochanteric region iliotibial band region. The knee was attends to palpation in crepitus of the knee with no ballottement of the patella increased warmth erythema of the knee. There appeared to be negative anterior and posterior drawer signs. No definite sensory deficit or dermatomal dystrophy region of the lower extremities noted. There was negative clonus negative Homans. Abdomen protuberant without excessive tends to palpation and no costovertebral angle tenderness noted      Assessment & Plan:    Degenerative disc disease lumbar spine Mild to moderate multilevel degenerative changes with facet arthrosis worse at L5-S1 without significant spinal canal of neural foraminal stenosis mild asymmetric enlargement of the right L5 nerve root which could reflect a radiculitis, L5-S1 mild bulging disc with symptoms small central disc protrusion. L4-5 facet arthrosis. L3-4 disc bulging with facet arthropathy. L2-3 left foraminal/extra foraminal disc protrusion slightly displaces the exiting L2 nerve root. L1-2 shallow left foraminal/extra foraminal disc protrusion  Lumbar stenosis with neurogenic claudication  Lumbar radiculopathy  Lumbar facet syndrome  Degenerative disc disease cervical spine  Status post surgery of the cervical region     PLAN   Continue present medication oxycodone . Caution oxycodone can cause respiratory depression, excessive sedation, confusion and other side effects  F/U PCP Tiffany Gomez or other PCP at facility for evaliation of  BP and general medical  condition.   F/U surgical evaluation. Neurosurgical reevaluation with Dr. Dutch QuintPoole as discussed  F/U neurological evaluation as discussed  May consider radiofrequency rhizolysis or intraspinal procedures pending  response  to present treatment and F/U evaluation.  Patient to call Pain Management Center should patient have concerns prior to scheduled return appointment

## 2015-07-29 ENCOUNTER — Emergency Department: Payer: Medicare Other

## 2015-07-29 ENCOUNTER — Other Ambulatory Visit: Payer: Self-pay

## 2015-07-29 ENCOUNTER — Inpatient Hospital Stay
Admission: EM | Admit: 2015-07-29 | Discharge: 2015-08-05 | DRG: 682 | Disposition: A | Discharge status: Skilled Nursing Facility | Payer: Medicare Other | Attending: Internal Medicine | Admitting: Internal Medicine

## 2015-07-29 ENCOUNTER — Encounter: Payer: Self-pay | Admitting: *Deleted

## 2015-07-29 ENCOUNTER — Inpatient Hospital Stay: Payer: Medicare Other

## 2015-07-29 DIAGNOSIS — T39395A Adverse effect of other nonsteroidal anti-inflammatory drugs [NSAID], initial encounter: Secondary | ICD-10-CM | POA: Diagnosis present

## 2015-07-29 DIAGNOSIS — R197 Diarrhea, unspecified: Secondary | ICD-10-CM | POA: Diagnosis not present

## 2015-07-29 DIAGNOSIS — E119 Type 2 diabetes mellitus without complications: Secondary | ICD-10-CM | POA: Diagnosis present

## 2015-07-29 DIAGNOSIS — J9602 Acute respiratory failure with hypercapnia: Secondary | ICD-10-CM | POA: Diagnosis present

## 2015-07-29 DIAGNOSIS — T383X5A Adverse effect of insulin and oral hypoglycemic [antidiabetic] drugs, initial encounter: Secondary | ICD-10-CM | POA: Diagnosis present

## 2015-07-29 DIAGNOSIS — F329 Major depressive disorder, single episode, unspecified: Secondary | ICD-10-CM | POA: Diagnosis present

## 2015-07-29 DIAGNOSIS — A419 Sepsis, unspecified organism: Secondary | ICD-10-CM

## 2015-07-29 DIAGNOSIS — W19XXXA Unspecified fall, initial encounter: Secondary | ICD-10-CM

## 2015-07-29 DIAGNOSIS — G4733 Obstructive sleep apnea (adult) (pediatric): Secondary | ICD-10-CM | POA: Diagnosis present

## 2015-07-29 DIAGNOSIS — R0600 Dyspnea, unspecified: Secondary | ICD-10-CM

## 2015-07-29 DIAGNOSIS — M6282 Rhabdomyolysis: Secondary | ICD-10-CM | POA: Diagnosis present

## 2015-07-29 DIAGNOSIS — Z9884 Bariatric surgery status: Secondary | ICD-10-CM

## 2015-07-29 DIAGNOSIS — R34 Anuria and oliguria: Secondary | ICD-10-CM | POA: Diagnosis present

## 2015-07-29 DIAGNOSIS — E875 Hyperkalemia: Secondary | ICD-10-CM | POA: Diagnosis present

## 2015-07-29 DIAGNOSIS — N170 Acute kidney failure with tubular necrosis: Principal | ICD-10-CM | POA: Diagnosis present

## 2015-07-29 DIAGNOSIS — Y92009 Unspecified place in unspecified non-institutional (private) residence as the place of occurrence of the external cause: Secondary | ICD-10-CM | POA: Diagnosis not present

## 2015-07-29 DIAGNOSIS — D72823 Leukemoid reaction: Secondary | ICD-10-CM | POA: Diagnosis present

## 2015-07-29 DIAGNOSIS — R571 Hypovolemic shock: Secondary | ICD-10-CM | POA: Diagnosis present

## 2015-07-29 DIAGNOSIS — N179 Acute kidney failure, unspecified: Secondary | ICD-10-CM | POA: Insufficient documentation

## 2015-07-29 DIAGNOSIS — Z79899 Other long term (current) drug therapy: Secondary | ICD-10-CM

## 2015-07-29 DIAGNOSIS — Z981 Arthrodesis status: Secondary | ICD-10-CM | POA: Diagnosis not present

## 2015-07-29 DIAGNOSIS — R52 Pain, unspecified: Secondary | ICD-10-CM

## 2015-07-29 DIAGNOSIS — E785 Hyperlipidemia, unspecified: Secondary | ICD-10-CM | POA: Diagnosis present

## 2015-07-29 DIAGNOSIS — E874 Mixed disorder of acid-base balance: Secondary | ICD-10-CM | POA: Diagnosis present

## 2015-07-29 DIAGNOSIS — G8929 Other chronic pain: Secondary | ICD-10-CM | POA: Diagnosis present

## 2015-07-29 DIAGNOSIS — Z79818 Long term (current) use of other agents affecting estrogen receptors and estrogen levels: Secondary | ICD-10-CM

## 2015-07-29 DIAGNOSIS — J9601 Acute respiratory failure with hypoxia: Secondary | ICD-10-CM

## 2015-07-29 DIAGNOSIS — I10 Essential (primary) hypertension: Secondary | ICD-10-CM | POA: Diagnosis present

## 2015-07-29 DIAGNOSIS — Z4659 Encounter for fitting and adjustment of other gastrointestinal appliance and device: Secondary | ICD-10-CM

## 2015-07-29 DIAGNOSIS — T464X5A Adverse effect of angiotensin-converting-enzyme inhibitors, initial encounter: Secondary | ICD-10-CM | POA: Diagnosis present

## 2015-07-29 DIAGNOSIS — R579 Shock, unspecified: Secondary | ICD-10-CM

## 2015-07-29 DIAGNOSIS — Z992 Dependence on renal dialysis: Secondary | ICD-10-CM

## 2015-07-29 DIAGNOSIS — J9690 Respiratory failure, unspecified, unspecified whether with hypoxia or hypercapnia: Secondary | ICD-10-CM | POA: Insufficient documentation

## 2015-07-29 DIAGNOSIS — J45909 Unspecified asthma, uncomplicated: Secondary | ICD-10-CM | POA: Diagnosis present

## 2015-07-29 DIAGNOSIS — T796XXA Traumatic ischemia of muscle, initial encounter: Secondary | ICD-10-CM | POA: Diagnosis not present

## 2015-07-29 DIAGNOSIS — Z6841 Body Mass Index (BMI) 40.0 and over, adult: Secondary | ICD-10-CM

## 2015-07-29 DIAGNOSIS — M1712 Unilateral primary osteoarthritis, left knee: Secondary | ICD-10-CM | POA: Diagnosis present

## 2015-07-29 DIAGNOSIS — Z7984 Long term (current) use of oral hypoglycemic drugs: Secondary | ICD-10-CM | POA: Diagnosis not present

## 2015-07-29 DIAGNOSIS — E876 Hypokalemia: Secondary | ICD-10-CM | POA: Diagnosis not present

## 2015-07-29 DIAGNOSIS — I959 Hypotension, unspecified: Secondary | ICD-10-CM | POA: Diagnosis not present

## 2015-07-29 DIAGNOSIS — Z8249 Family history of ischemic heart disease and other diseases of the circulatory system: Secondary | ICD-10-CM | POA: Diagnosis not present

## 2015-07-29 DIAGNOSIS — M79659 Pain in unspecified thigh: Secondary | ICD-10-CM

## 2015-07-29 DIAGNOSIS — M79652 Pain in left thigh: Secondary | ICD-10-CM | POA: Diagnosis present

## 2015-07-29 DIAGNOSIS — R609 Edema, unspecified: Secondary | ICD-10-CM

## 2015-07-29 LAB — BLOOD GAS, VENOUS
ACID-BASE DEFICIT: 12.9 mmol/L — AB (ref 0.0–2.0)
Bicarbonate: 16.8 mEq/L — ABNORMAL LOW (ref 21.0–28.0)
O2 SAT: 42.1 %
PATIENT TEMPERATURE: 37
PCO2 VEN: 54 mmHg (ref 44.0–60.0)
PO2 VEN: 34 mmHg (ref 31.0–45.0)
pH, Ven: 7.1 — CL (ref 7.320–7.430)

## 2015-07-29 LAB — BLOOD GAS, ARTERIAL
Acid-base deficit: 10 mmol/L — ABNORMAL HIGH (ref 0.0–2.0)
BICARBONATE: 18.5 meq/L — AB (ref 21.0–28.0)
FIO2: 1
LHR: 12 {breaths}/min
O2 Saturation: 99.7 %
PATIENT TEMPERATURE: 37
PEEP/CPAP: 5 cmH2O
VT: 500 mL
pCO2 arterial: 52 mmHg — ABNORMAL HIGH (ref 32.0–48.0)
pH, Arterial: 7.16 — CL (ref 7.350–7.450)
pO2, Arterial: 238 mmHg — ABNORMAL HIGH (ref 83.0–108.0)

## 2015-07-29 LAB — COMPREHENSIVE METABOLIC PANEL
ALBUMIN: 3.2 g/dL — AB (ref 3.5–5.0)
ALBUMIN: 2.7 g/dL — AB (ref 3.5–5.0)
ALK PHOS: 96 U/L (ref 38–126)
ALT: 73 U/L — AB (ref 14–54)
ALT: 90 U/L — ABNORMAL HIGH (ref 14–54)
ANION GAP: 18 — AB (ref 5–15)
AST: 183 U/L — ABNORMAL HIGH (ref 15–41)
AST: 296 U/L — AB (ref 15–41)
Alkaline Phosphatase: 84 U/L (ref 38–126)
Anion gap: 9 (ref 5–15)
BILIRUBIN TOTAL: 0.6 mg/dL (ref 0.3–1.2)
BUN: 36 mg/dL — AB (ref 6–20)
BUN: 38 mg/dL — AB (ref 6–20)
CALCIUM: 7.8 mg/dL — AB (ref 8.9–10.3)
CHLORIDE: 105 mmol/L (ref 101–111)
CO2: 16 mmol/L — ABNORMAL LOW (ref 22–32)
CO2: 21 mmol/L — AB (ref 22–32)
CREATININE: 5.83 mg/dL — AB (ref 0.44–1.00)
Calcium: 6.4 mg/dL — CL (ref 8.9–10.3)
Chloride: 98 mmol/L — ABNORMAL LOW (ref 101–111)
Creatinine, Ser: 4.66 mg/dL — ABNORMAL HIGH (ref 0.44–1.00)
GFR calc Af Amer: 9 mL/min — ABNORMAL LOW (ref >60)
GFR calc Af Amer: 12 mL/min — ABNORMAL LOW (ref >60)
GFR calc non Af Amer: 8 mL/min — ABNORMAL LOW (ref >60)
GFR calc non Af Amer: 10 mL/min — ABNORMAL LOW (ref >60)
GLUCOSE: 238 mg/dL — AB (ref 65–99)
GLUCOSE: 265 mg/dL — AB (ref 65–99)
POTASSIUM: 4.9 mmol/L (ref 3.5–5.1)
Potassium: 6.9 mmol/L (ref 3.5–5.1)
Sodium: 132 mmol/L — ABNORMAL LOW (ref 135–145)
Sodium: 135 mmol/L (ref 135–145)
TOTAL PROTEIN: 7 g/dL (ref 6.5–8.1)
Total Bilirubin: 0.7 mg/dL (ref 0.3–1.2)
Total Protein: 5.8 g/dL — ABNORMAL LOW (ref 6.5–8.1)

## 2015-07-29 LAB — URINALYSIS COMPLETE WITH MICROSCOPIC (ARMC ONLY)
BACTERIA UA: NONE SEEN
Bilirubin Urine: NEGATIVE
Glucose, UA: NEGATIVE mg/dL
HGB URINE DIPSTICK: NEGATIVE
Ketones, ur: NEGATIVE mg/dL
Leukocytes, UA: NEGATIVE
Nitrite: NEGATIVE
PH: 5 (ref 5.0–8.0)
PROTEIN: 30 mg/dL — AB
RBC / HPF: NONE SEEN RBC/hpf (ref 0–5)
SPECIFIC GRAVITY, URINE: 1.02 (ref 1.005–1.030)
SQUAMOUS EPITHELIAL / LPF: NONE SEEN

## 2015-07-29 LAB — URINE DRUG SCREEN, QUALITATIVE (ARMC ONLY)
Amphetamines, Ur Screen: NOT DETECTED
BARBITURATES, UR SCREEN: NOT DETECTED
BENZODIAZEPINE, UR SCRN: NOT DETECTED
CANNABINOID 50 NG, UR ~~LOC~~: NOT DETECTED
Cocaine Metabolite,Ur ~~LOC~~: NOT DETECTED
MDMA (Ecstasy)Ur Screen: NOT DETECTED
METHADONE SCREEN, URINE: NOT DETECTED
Opiate, Ur Screen: POSITIVE — AB
Phencyclidine (PCP) Ur S: NOT DETECTED
TRICYCLIC, UR SCREEN: POSITIVE — AB

## 2015-07-29 LAB — ETHANOL: Alcohol, Ethyl (B): <5 mg/dL (ref <5)

## 2015-07-29 LAB — CREATININE, SERUM
Creatinine, Ser: 4.57 mg/dL — ABNORMAL HIGH (ref 0.44–1.00)
GFR calc Af Amer: 12 mL/min — ABNORMAL LOW (ref >60)
GFR calc non Af Amer: 11 mL/min — ABNORMAL LOW (ref >60)

## 2015-07-29 LAB — CBC
HCT: 33 % — ABNORMAL LOW (ref 35.0–47.0)
HEMATOCRIT: 37.9 % (ref 35.0–47.0)
HEMOGLOBIN: 11.7 g/dL — AB (ref 12.0–16.0)
Hemoglobin: 10.4 g/dL — ABNORMAL LOW (ref 12.0–16.0)
MCH: 27.2 pg (ref 26.0–34.0)
MCH: 27.2 pg (ref 26.0–34.0)
MCHC: 30.8 g/dL — AB (ref 32.0–36.0)
MCHC: 31.4 g/dL — ABNORMAL LOW (ref 32.0–36.0)
MCV: 88.3 fL (ref 80.0–100.0)
MCV: 86.6 fL (ref 80.0–100.0)
PLATELETS: 376 10*3/uL (ref 150–440)
Platelets: 396 10*3/uL (ref 150–440)
RBC: 4.29 MIL/uL (ref 3.80–5.20)
RBC: 3.81 MIL/uL (ref 3.80–5.20)
RDW: 15.5 % — ABNORMAL HIGH (ref 11.5–14.5)
RDW: 14.9 % — AB (ref 11.5–14.5)
WBC: 30.2 10*3/uL — ABNORMAL HIGH (ref 3.6–11.0)
WBC: 22.4 10*3/uL — AB (ref 3.6–11.0)

## 2015-07-29 LAB — CK: CK TOTAL: 9081 U/L — AB (ref 38–234)

## 2015-07-29 LAB — PHOSPHORUS: Phosphorus: 6.6 mg/dL — ABNORMAL HIGH (ref 2.5–4.6)

## 2015-07-29 LAB — GLUCOSE, CAPILLARY
GLUCOSE-CAPILLARY: 243 mg/dL — AB (ref 65–99)
Glucose-Capillary: 250 mg/dL — ABNORMAL HIGH (ref 65–99)

## 2015-07-29 LAB — TROPONIN I
Troponin I: 0.12 ng/mL — ABNORMAL HIGH (ref <0.031)
Troponin I: 0.18 ng/mL — ABNORMAL HIGH (ref <0.031)

## 2015-07-29 LAB — LIPASE, BLOOD: LIPASE: 13 U/L (ref 11–51)

## 2015-07-29 LAB — AMYLASE: AMYLASE: 164 U/L — AB (ref 28–100)

## 2015-07-29 LAB — ACETAMINOPHEN LEVEL: Acetaminophen (Tylenol), Serum: <10 ug/mL — ABNORMAL LOW (ref 10–30)

## 2015-07-29 LAB — LACTIC ACID, PLASMA: LACTIC ACID, VENOUS: 1.8 mmol/L (ref 0.5–2.0)

## 2015-07-29 LAB — SALICYLATE LEVEL: Salicylate Lvl: <4 mg/dL (ref 2.8–30.0)

## 2015-07-29 LAB — TRIGLYCERIDES: Triglycerides: 183 mg/dL — ABNORMAL HIGH (ref <150)

## 2015-07-29 LAB — MAGNESIUM: Magnesium: 1.6 mg/dL — ABNORMAL LOW (ref 1.7–2.4)

## 2015-07-29 MED ORDER — PIPERACILLIN-TAZOBACTAM 3.375 G IVPB: 3.3750 g | Freq: Two times a day (BID) | INTRAVENOUS | Status: DC

## 2015-07-29 MED ORDER — SODIUM CHLORIDE 0.9% FLUSH
10.0000 mL | Freq: Two times a day (BID) | INTRAVENOUS | Status: DC
  Administered 2015-07-29 – 2015-08-04 (×10): 10 mL

## 2015-07-29 MED ORDER — ONDANSETRON HCL 4 MG/2ML IJ SOLN: 4.0000 mg | Freq: Four times a day (QID) | INTRAMUSCULAR | Status: DC | PRN

## 2015-07-29 MED ORDER — FENTANYL CITRATE (PF) 100 MCG/2ML IJ SOLN
100.0000 ug | INTRAMUSCULAR | Status: DC | PRN
  Administered 2015-07-30: 100 ug via INTRAVENOUS
  Filled 2015-07-29 (×4): qty 2

## 2015-07-29 MED ORDER — ENOXAPARIN SODIUM 40 MG/0.4ML ~~LOC~~ SOLN: 30.0000 mg | SUBCUTANEOUS | Status: DC

## 2015-07-29 MED ORDER — ANTISEPTIC ORAL RINSE SOLUTION (CORINZ)
7.0000 mL | Freq: Four times a day (QID) | OROMUCOSAL | Status: DC
  Administered 2015-07-29 – 2015-07-31 (×7): 7 mL via OROMUCOSAL
  Filled 2015-07-29 (×14): qty 7

## 2015-07-29 MED ORDER — SODIUM CHLORIDE 0.9 % IV SOLN
250.0000 mL | INTRAVENOUS | Status: DC | PRN
  Administered 2015-08-03: 250 mL via INTRAVENOUS

## 2015-07-29 MED ORDER — NOREPINEPHRINE 4 MG/250ML-% IV SOLN
0.0000 ug/min | INTRAVENOUS | Status: DC
  Administered 2015-07-29: 10 ug/min via INTRAVENOUS
  Administered 2015-07-30 (×2): 20 ug/min via INTRAVENOUS
  Administered 2015-07-30: 7.5 ug/min via INTRAVENOUS
  Administered 2015-07-30: 18 ug/min via INTRAVENOUS
  Administered 2015-07-30: 35 ug/min via INTRAVENOUS
  Administered 2015-07-30: 15 ug/min via INTRAVENOUS
  Administered 2015-07-30: 25 ug/min via INTRAVENOUS
  Filled 2015-07-29 (×7): qty 250

## 2015-07-29 MED ORDER — CHLORHEXIDINE GLUCONATE 0.12% ORAL RINSE (MEDLINE KIT)
15.0000 mL | Freq: Two times a day (BID) | OROMUCOSAL | Status: DC
  Administered 2015-07-30 – 2015-07-31 (×2): 15 mL via OROMUCOSAL
  Filled 2015-07-29 (×6): qty 15

## 2015-07-29 MED ORDER — PIPERACILLIN-TAZOBACTAM 3.375 G IVPB 30 MIN
3.3750 g | Freq: Once | INTRAVENOUS | Status: AC
  Administered 2015-07-29: 3.375 g via INTRAVENOUS
  Filled 2015-07-29: qty 50

## 2015-07-29 MED ORDER — PROPOFOL 1000 MG/100ML IV EMUL
0.0000 ug/kg/min | INTRAVENOUS | Status: DC
  Administered 2015-07-29: 5 ug/kg/min via INTRAVENOUS
  Administered 2015-07-30: 30 ug/kg/min via INTRAVENOUS
  Administered 2015-07-30: 25 ug/kg/min via INTRAVENOUS
  Administered 2015-07-30: 30 ug/kg/min via INTRAVENOUS
  Administered 2015-07-30: 40 ug/kg/min via INTRAVENOUS
  Administered 2015-07-30: 30 ug/kg/min via INTRAVENOUS
  Administered 2015-07-30: 25 ug/kg/min via INTRAVENOUS
  Administered 2015-07-31: 30 ug/kg/min via INTRAVENOUS
  Filled 2015-07-29 (×9): qty 100

## 2015-07-29 MED ORDER — SODIUM CHLORIDE 0.9 % IV BOLUS (SEPSIS)
500.0000 mL | INTRAVENOUS | Status: AC
  Administered 2015-07-29: 500 mL via INTRAVENOUS

## 2015-07-29 MED ORDER — VANCOMYCIN HCL 10 G IV SOLR
1500.0000 mg | INTRAVENOUS | Status: DC
  Filled 2015-07-29: qty 1500

## 2015-07-29 MED ORDER — VANCOMYCIN HCL 10 G IV SOLR
1500.0000 mg | Freq: Once | INTRAVENOUS | Status: AC
  Administered 2015-07-30: 1500 mg via INTRAVENOUS
  Filled 2015-07-29: qty 1500

## 2015-07-29 MED ORDER — VANCOMYCIN HCL IN DEXTROSE 1-5 GM/200ML-% IV SOLN
1000.0000 mg | Freq: Once | INTRAVENOUS | Status: AC
  Administered 2015-07-29: 1000 mg via INTRAVENOUS
  Filled 2015-07-29: qty 200

## 2015-07-29 MED ORDER — PIPERACILLIN-TAZOBACTAM 4.5 G IVPB
4.5000 g | Freq: Two times a day (BID) | INTRAVENOUS | Status: DC
  Administered 2015-07-30 (×2): 4.5 g via INTRAVENOUS
  Filled 2015-07-29 (×3): qty 100

## 2015-07-29 MED ORDER — STERILE WATER FOR INJECTION IV SOLN
INTRAVENOUS | Status: DC
  Filled 2015-07-29 (×3): qty 850

## 2015-07-29 MED ORDER — DEXTROSE 50 % IV SOLN
25.0000 g | Freq: Once | INTRAVENOUS | Status: AC
  Administered 2015-07-29: 25 g via INTRAVENOUS
  Filled 2015-07-29: qty 50

## 2015-07-29 MED ORDER — IPRATROPIUM-ALBUTEROL 0.5-2.5 (3) MG/3ML IN SOLN
3.0000 mL | Freq: Four times a day (QID) | RESPIRATORY_TRACT | Status: DC
  Administered 2015-07-30 – 2015-08-01 (×9): 3 mL via RESPIRATORY_TRACT
  Filled 2015-07-29 (×10): qty 3

## 2015-07-29 MED ORDER — INSULIN ASPART 100 UNIT/ML ~~LOC~~ SOLN
2.0000 [IU] | SUBCUTANEOUS | Status: DC
  Administered 2015-07-29: 6 [IU] via SUBCUTANEOUS
  Administered 2015-07-30 (×4): 4 [IU] via SUBCUTANEOUS
  Administered 2015-07-30: 6 [IU] via SUBCUTANEOUS
  Administered 2015-07-31 (×2): 4 [IU] via SUBCUTANEOUS
  Administered 2015-07-31: 2 [IU] via SUBCUTANEOUS
  Administered 2015-07-31: 4 [IU] via SUBCUTANEOUS
  Filled 2015-07-29: qty 4
  Filled 2015-07-29: qty 2
  Filled 2015-07-29: qty 4
  Filled 2015-07-29: qty 6
  Filled 2015-07-29 (×4): qty 4
  Filled 2015-07-29: qty 6
  Filled 2015-07-29: qty 4

## 2015-07-29 MED ORDER — SODIUM CHLORIDE 0.9 % IV BOLUS (SEPSIS)
1000.0000 mL | Freq: Once | INTRAVENOUS | Status: AC
  Administered 2015-07-29: 1000 mL via INTRAVENOUS

## 2015-07-29 MED ORDER — SODIUM BICARBONATE 8.4 % IV SOLN: Freq: Once | INTRAVENOUS | Status: DC

## 2015-07-29 MED ORDER — SODIUM CHLORIDE 0.9 % IV BOLUS (SEPSIS)
1000.0000 mL | INTRAVENOUS | Status: AC
  Administered 2015-07-29 (×3): 1000 mL via INTRAVENOUS

## 2015-07-29 MED ORDER — STERILE WATER FOR INJECTION IV SOLN
INTRAVENOUS | Status: DC
  Administered 2015-07-29 – 2015-07-31 (×5): via INTRAVENOUS
  Filled 2015-07-29 (×10): qty 1000

## 2015-07-29 MED ORDER — FENTANYL CITRATE (PF) 100 MCG/2ML IJ SOLN
100.0000 ug | INTRAMUSCULAR | Status: DC | PRN
  Administered 2015-07-30 (×2): 100 ug via INTRAVENOUS
  Administered 2015-07-30: 50 ug via INTRAVENOUS
  Administered 2015-07-31 (×3): 100 ug via INTRAVENOUS
  Filled 2015-07-29 (×3): qty 2

## 2015-07-29 MED ORDER — FAMOTIDINE IN NACL 20-0.9 MG/50ML-% IV SOLN
20.0000 mg | Freq: Two times a day (BID) | INTRAVENOUS | Status: DC
  Administered 2015-07-29: 20 mg via INTRAVENOUS
  Filled 2015-07-29 (×2): qty 50

## 2015-07-29 MED ORDER — SODIUM CHLORIDE 0.9 % IV BOLUS (SEPSIS)
1000.0000 mL | Freq: Once | INTRAVENOUS | Status: AC
Start: 2015-07-29 — End: 2015-07-29
  Administered 2015-07-29: 1000 mL via INTRAVENOUS

## 2015-07-29 MED ORDER — ANTISEPTIC ORAL RINSE SOLUTION (CORINZ)
7.0000 mL | Freq: Four times a day (QID) | OROMUCOSAL | Status: DC
  Administered 2015-07-30 – 2015-07-31 (×5): 7 mL via OROMUCOSAL
  Filled 2015-07-29 (×14): qty 7

## 2015-07-29 MED ORDER — ETOMIDATE 2 MG/ML IV SOLN
40.0000 mg | Freq: Once | INTRAVENOUS | Status: AC
  Administered 2015-07-29: 40 mg via INTRAVENOUS

## 2015-07-29 MED ORDER — CHLORHEXIDINE GLUCONATE 0.12% ORAL RINSE (MEDLINE KIT)
15.0000 mL | Freq: Two times a day (BID) | OROMUCOSAL | Status: DC
  Administered 2015-07-29 – 2015-07-31 (×5): 15 mL via OROMUCOSAL
  Filled 2015-07-29 (×8): qty 15

## 2015-07-29 MED ORDER — ACETAMINOPHEN 325 MG PO TABS
650.0000 mg | ORAL_TABLET | ORAL | Status: DC | PRN
  Filled 2015-07-29: qty 2

## 2015-07-29 MED ORDER — ROCURONIUM BROMIDE 50 MG/5ML IV SOLN
120.0000 mg | Freq: Once | INTRAVENOUS | Status: AC
  Administered 2015-07-29: 120 mg via INTRAVENOUS

## 2015-07-29 MED ORDER — VANCOMYCIN HCL IN DEXTROSE 1-5 GM/200ML-% IV SOLN: 1000.0000 mg | INTRAVENOUS | Status: DC

## 2015-07-29 MED ORDER — METRONIDAZOLE IN NACL 5-0.79 MG/ML-% IV SOLN
500.0000 mg | Freq: Three times a day (TID) | INTRAVENOUS | Status: DC
  Administered 2015-07-30 (×2): 500 mg via INTRAVENOUS
  Filled 2015-07-29 (×3): qty 100

## 2015-07-29 MED ORDER — NOREPINEPHRINE 4 MG/250ML-% IV SOLN
INTRAVENOUS | Status: AC
  Administered 2015-07-30: 35 ug/min via INTRAVENOUS
  Filled 2015-07-29: qty 250

## 2015-07-29 MED ORDER — NOREPINEPHRINE BITARTRATE 1 MG/ML IV SOLN: 0.0000 ug/min | Freq: Once | INTRAVENOUS | Status: DC

## 2015-07-29 MED ORDER — SODIUM BICARBONATE 8.4 % IV SOLN
100.0000 meq | Freq: Once | INTRAVENOUS | Status: AC
  Administered 2015-07-29: 100 meq via INTRAVENOUS

## 2015-07-29 MED ORDER — INSULIN ASPART 100 UNIT/ML ~~LOC~~ SOLN
10.0000 [IU] | Freq: Once | SUBCUTANEOUS | Status: AC
  Administered 2015-07-29: 10 [IU] via INTRAVENOUS
  Filled 2015-07-29 (×2): qty 0.1
  Filled 2015-07-29: qty 10

## 2015-07-29 NOTE — ED Notes (Addendum)
Dr. Lenard LancePaduchowski, Dr. Mayford KnifeWilliams, Raquel RN, Erie NoeVanessa RN, Dora SimsSonjia RN, RT at bedside.

## 2015-07-29 NOTE — Consult Note (Addendum)
Pharmacy Antibiotic Note  Tiffany Gomez is a 50 y.o. female admitted on 07/29/2015 with sepsis.  Pharmacy has been consulted for vancomycin, metronidazole, zosyn dosing.  Plan: Pt received 1g of vancomycin in the ED. Per ICU Dr. Rock NephewPt is being started on CCRT tonight due to rhabdomyolysis.  Zosyn 3.375g q 8 hours  Vancomycin 1g q 24 hours trough prior to third dose.   Weight: (!) 389 lb 4.8 oz (176.585 kg)  Temp (24hrs), Avg:97.6 F (36.4 C), Min:97.6 F (36.4 C), Max:97.6 F (36.4 C)   Recent Labs Lab 07/29/15 1713  WBC 30.2*  CREATININE 5.83*    Estimated Creatinine Clearance: 20 mL/min (by C-G formula based on Cr of 5.83).    No Known Allergies  Antimicrobials this admission: 4/26 vancomycin >>  4/26 zosyn >>  4/26 metronidazole >>  Dose adjustments this admission:   Microbiology results: 4/26 BCx: pending 4/26 UCx: pending    Thank you for allowing pharmacy to be a part of this patient's care.  Olene FlossMelissa D Tylon Kemmerling, Pharm.D Clinical Pharmacist   07/29/2015 8:06 PM

## 2015-07-29 NOTE — ED Notes (Signed)
Patient is intubated, 7.5, color change positive, 24 at the lip, breath sounds verified by Dr. Lenard LancePaduchowski.

## 2015-07-29 NOTE — Consult Note (Signed)
Date: 07/29/2015                  Patient Name:  Tiffany Gomez  MRN: [id number]  DOB: 03-01-1966  Age / Sex: 50 y.o., female         PCP: WHITE, Arlie Solomons, FNP                 Service Requesting Consult: PCCM                 Reason for Consult: ARF, Hyperkalemia, dialysis            History of Present Illness: Patient is a 50 y.o. female with medical problems of morbid obesity, DM, OSA, Depression, HTN, HLD, Gastric Bypass, Cervical disc fusion, chronic pain (lumbar radiculopathy) who was admitted to [facility] on 07/29/2015 .  Patient's mother called EMS who found her at home disoriented and lethargic with minimal response to Narcan. Per one report she was found on floor and was down for unknown amount of time.  In ER, she is noted to have ARF (normal Cr last year), severe acidosis,elevated CK  and severe Hyperkalemia Nephrology is consulted for e/m She is oliguric and hypotensive.  Levophed is being started Dialysis catheter is being placed by PCCM team    Medications: Outpatient medications:  (Not in a hospital admission)  Current medications: Current Facility-Administered Medications  Medication Dose Route Frequency Provider Last Rate Last Dose  . 0.9 %  sodium chloride infusion  250 mL Intravenous PRN Vishal Mungal, MD      . acetaminophen (TYLENOL) tablet 650 mg  650 mg Oral Q4H PRN Vilinda Boehringer, MD      . Derrill Memo ON 07/30/2015] antiseptic oral rinse solution (CORINZ)  7 mL Mouth Rinse QID Vishal Mungal, MD      . bupivacaine (PF) (MARCAINE) 0.25 % injection 30 mL  30 mL Other Once Mohammed Kindle, MD      . ceFAZolin (ANCEF) IVPB 1 g/50 mL premix  1 g Intravenous Once Mohammed Kindle, MD      . ceFAZolin (ANCEF) IVPB 1 g/50 mL premix  1 g Intravenous Once Mohammed Kindle, MD      . ceFAZolin (ANCEF) IVPB 1 g/50 mL premix  1 g Intravenous Once Mohammed Kindle, MD      . ceFAZolin (ANCEF) IVPB 1 g/50 mL premix  1 g Intravenous Once Mohammed Kindle, MD      . chlorhexidine  gluconate (SAGE KIT) (PERIDEX) 0.12 % solution 15 mL  15 mL Mouth Rinse BID Vishal Mungal, MD      . enoxaparin (LOVENOX) injection 30 mg  30 mg Subcutaneous Q24H Vishal Mungal, MD      . famotidine (PEPCID) IVPB 20 mg premix  20 mg Intravenous BID Vishal Mungal, MD      . fentaNYL (SUBLIMAZE) injection 100 mcg  100 mcg Intravenous Q15 min PRN Vishal Mungal, MD      . fentaNYL (SUBLIMAZE) injection 100 mcg  100 mcg Intravenous Q2H PRN Vishal Mungal, MD      . insulin aspart (novoLOG) injection 2-6 Units  2-6 Units Subcutaneous Q4H Vishal Mungal, MD      . ipratropium-albuterol (DUONEB) 0.5-2.5 (3) MG/3ML nebulizer solution 3 mL  3 mL Nebulization Q6H Vishal Mungal, MD      . lactated ringers infusion 1,000 mL  1,000 mL Intravenous Continuous Mohammed Kindle, MD      . lactated ringers infusion 1,000 mL  1,000 mL Intravenous Continuous Mohammed Kindle, MD      .  lactated ringers infusion 1,000 mL  1,000 mL Intravenous Continuous Mohammed Kindle, MD      . lidocaine (PF) (XYLOCAINE) 1 % injection 10 mL  10 mL Subcutaneous Once Mohammed Kindle, MD      . metroNIDAZOLE (FLAGYL) IVPB 500 mg  500 mg Intravenous Q8H Vishal Mungal, MD      . norepinephrine (LEVOPHED) 4-5 MG/250ML-% infusion           . norepinephrine (LEVOPHED) '4mg'$  in D5W 240m premix infusion  0-40 mcg/min Intravenous Titrated VVilinda Boehringer MD   Stopped at 07/29/15 2009  . ondansetron (ZOFRAN) injection 4 mg  4 mg Intravenous Q6H PRN Vishal Mungal, MD      . orphenadrine (NORFLEX) injection 60 mg  60 mg Intramuscular Once GMohammed Kindle MD      . [Derrill MemoON 07/30/2015] piperacillin-tazobactam (ZOSYN) IVPB 3.375 g  3.375 g Intravenous Q12H Vishal Mungal, MD      . propofol (DIPRIVAN) 1000 MG/100ML infusion  0-50 mcg/kg/min Intravenous Continuous Vishal Mungal, MD      . sodium chloride 0.9 % bolus 1,000 mL  1,000 mL Intravenous Q1H KHarvest Dark MD 1,000 mL/hr at 07/29/15 2005 1,000 mL at 07/29/15 2005   And  . sodium chloride 0.9 % bolus  500 mL  500 mL Intravenous Q1H KHarvest Dark MD 500 mL/hr at 07/29/15 1926 500 mL at 07/29/15 1926  . sodium chloride 0.9 % injection 20 mL  20 mL Other Once GMohammed Kindle MD      . sodium chloride flush (NS) 0.9 % injection 20 mL  20 mL Other Once GMohammed Kindle MD      . triamcinolone acetonide ([facility] injection 40 mg  40 mg Other Once GMohammed Kindle MD      . triamcinolone acetonide ([facility] injection 40 mg  40 mg Other Once GMohammed Kindle MD      . [Derrill MemoON 07/30/2015] vancomycin (VANCOCIN) 1,500 mg in sodium chloride 0.9 % 500 mL IVPB  1,500 mg Intravenous Q36H VVilinda Boehringer MD       Current Outpatient Prescriptions  Medication Sig Dispense Refill  . albuterol (PROVENTIL HFA;VENTOLIN HFA) 108 (90 BASE) MCG/ACT inhaler Inhale 2 puffs into the lungs every 6 (six) hours as needed for wheezing or shortness of breath.    . alprazolam (XANAX) 2 MG tablet Take 2 mg by mouth 4 (four) times daily as needed for anxiety.     . clonazePAM (KLONOPIN) 1 MG tablet Take 1 mg by mouth at bedtime.   4  . cyclobenzaprine (FLEXERIL) 5 MG tablet Take 5 mg by mouth every 8 (eight) hours as needed for muscle spasms.     .Marland Kitchengabapentin (NEURONTIN) 300 MG capsule Take 300-600 mg by mouth 3 (three) times daily as needed (for pain).    .Marland KitchenglipiZIDE (GLUCOTROL XL) 5 MG 24 hr tablet Take 5 mg by mouth daily with breakfast.    . ibuprofen (ADVIL,MOTRIN) 800 MG tablet Take 800 mg by mouth every 8 (eight) hours as needed for headache or mild pain.   2  . linagliptin (TRADJENTA) 5 MG TABS tablet Take 5 mg by mouth daily.    .Marland Kitchenlisinopril (PRINIVIL,ZESTRIL) 5 MG tablet Take 5 mg by mouth daily.    . megestrol (MEGACE) 40 MG tablet Take 40 mg by mouth daily.   3  . metFORMIN (GLUCOPHAGE) 500 MG tablet Take 500 mg by mouth 2 (two) times daily with a meal.   5  . montelukast (SINGULAIR) 10 MG tablet Take 10  mg by mouth at bedtime.    . Oxycodone HCl 10 MG TABS Take 10 mg by mouth 5 (five) times daily as needed (for  pain).    . pravastatin (PRAVACHOL) 20 MG tablet Take 20 mg by mouth at bedtime.    Marland Kitchen venlafaxine (EFFEXOR) 75 MG tablet Take 75 mg by mouth 2 (two) times daily with a meal.   2  . ziprasidone (GEODON) 80 MG capsule Take 160 mg by mouth daily with supper.   2      Allergies: No Known Allergies    Past Medical History: Past Medical History  Diagnosis Date  . Diabetes (HCC)   . Sleep apnea   . Leg pain   . Asthma   . Depression   . Morbid obesity (HCC)   . Essential hypertension   . Hyperlipidemia   . Anxiety     Panic attacks     Past Surgical History: Past Surgical History  Procedure Laterality Date  . Gastric bypass    . Laparoscopic cholecystectomy    . Cardiac catheterization    . Anterior cervical decomp/discectomy fusion N/A 06/16/2014    Procedure: ANTERIOR CERVICAL DECOMPRESSION/DISCECTOMY FUSION CERVICAL FIVE-SIX;  Surgeon: Julio Sicks, MD;  Location: [facility];  Service: Neurosurgery;  Laterality: N/A;     Family History: Family History  Problem Relation Age of Onset  . Heart failure Mother      Social History: Social History   Social History  . Marital Status: Single    Spouse Name: N/A  . Number of Children: N/A  . Years of Education: N/A   Occupational History  . Not on file.   Social History Main Topics  . Smoking status: Never Smoker   . Smokeless tobacco: Not on file  . Alcohol Use: No  . Drug Use: No  . Sexual Activity: Not on file   Other Topics Concern  . Not on file   Social History Narrative     Review of Systems: unavailable as patient is intubated Gen:  HEENT:  CV:  Resp:  GI: GU :  MS:  Derm:   Psych: Heme:  Neuro:  Endocrine  Vital Signs: Blood pressure 81/47, pulse 100, temperature 97.6 F (36.4 C), temperature source Oral, resp. rate 16, weight 176.585 kg (389 lb 4.8 oz), SpO2 100 %.  No intake or output data in the 24 hours ending 07/29/15 2018  Weight trends: Filed Weights   07/29/15 1708   Weight: 176.585 kg (389 lb 4.8 oz)    Physical Exam: General:  Critically ill appearing  HEENT ETT  Neck: supple  Lungs: Vent assisted  Heart:: Regular, no rub  Abdomen: Soft, obese  Extremities: No peripheral edema  Neurologic: sedated  Skin: No acute rashes  Access: Rt IJ vascath (dr Dema Severin) 4/26  Foley: present       Lab results: Basic Metabolic Panel:  Recent Labs Lab 07/29/15 1713  NA 132*  K 6.9*  CL 98*  CO2 16*  GLUCOSE 238*  BUN 36*  CREATININE 5.83*  CALCIUM 7.8*    Liver Function Tests:  Recent Labs Lab 07/29/15 1713  AST 183*  ALT 73*  ALKPHOS 96  BILITOT 0.6  PROT 7.0  ALBUMIN 3.2*   No results for input(s): LIPASE, AMYLASE in the last 168 hours. No results for input(s): AMMONIA in the last 168 hours.  CBC:  Recent Labs Lab 07/29/15 1713  WBC 30.2*  HGB 11.7*  HCT 37.9  MCV 88.3  PLT  396    Cardiac Enzymes:  Recent Labs Lab 07/29/15 1713  CKTOTAL 9081*  TROPONINI 0.12*    BNP: Invalid input(s): POCBNP  CBG: No results for input(s): GLUCAP in the last 168 hours.  Microbiology: No results found for this or any previous visit (from the past 720 hour(s)).   Coagulation Studies: No results for input(s): LABPROT, INR in the last 72 hours.  Urinalysis:  Recent Labs  07/29/15 1713  COLORURINE AMBER*  LABSPEC 1.020  PHURINE 5.0  GLUCOSEU NEGATIVE  HGBUR NEGATIVE  BILIRUBINUR NEGATIVE  KETONESUR NEGATIVE  PROTEINUR 30*  NITRITE NEGATIVE  LEUKOCYTESUR NEGATIVE        Imaging: Dg Chest Portable 1 View  07/29/2015  CLINICAL DATA:  Status post intubation. EXAM: PORTABLE CHEST 1 VIEW COMPARISON:  Same day. FINDINGS: Stable cardiomegaly. No pneumothorax is noted. No significant pleural effusion is noted. Right basilar opacity is noted concerning for pneumonia or subsegmental atelectasis. Endotracheal tube is seen projected over the tracheal air shadow with distal tip 2 cm above the carina. Bony thorax is  unremarkable. IMPRESSION: Endotracheal tube in grossly good position. Mild right basilar subsegmental atelectasis or pneumonia is noted. Electronically Signed   By: Marijo Conception, M.D.   On: 07/29/2015 20:10   Dg Chest Portable 1 View  07/29/2015  CLINICAL DATA:  Acute onset of disorientation and lethargy. Vomiting and diarrhea. Initial encounter. EXAM: PORTABLE CHEST 1 VIEW COMPARISON:  Chest radiograph performed 05/30/2014 FINDINGS: The lungs are hypoexpanded. Mild vascular crowding and vascular congestion are seen. There is no evidence of focal opacification, pleural effusion or pneumothorax. The cardiomediastinal silhouette is borderline enlarged. No acute osseous abnormalities are seen. IMPRESSION: Mild vascular congestion and borderline cardiomegaly. Lungs remain grossly clear. Electronically Signed   By: Garald Balding M.D.   On: 07/29/2015 18:26      Assessment & Plan: Pt is a 50 y.o. yo female with a PMHX of morbid obesity, DM, OSA, Depression, HTN, HLD, Gastric Bypass, Cervical disc fusion, chronic pain (lumbar radiculopathy), was admitted on 07/29/2015 after being found unresponsive at home  1. ARF - oliguric. Likely ATN, Patient is hypotensive.  Other contributing factors include metformin, ACE-i, Ibuprofen, Rhabdomyolysis 2. Severe Hyperkerkalemia due to Rhabdomyolysis (CK > 9000) 3. Acute respiratory failure- intubated in ER 4/26 4. Severe Acidosis, Ph 7.10 5. Altered mental status  Plan: Due to oliguric ARF and severe hyperkalemia, emergent HD is recommended ICU team has placed the dialysis catheter and dialysis nursing team has been alerted Patient is being started on levophed as pressor support Recommend Bicarb drip Emergent HD to control potassium (2.5 h, 1 K, 250/500) No family is available for consent at this time but ER and ICU team has discussed need for dialysis with family short while ago Re-assess daily for need for HD

## 2015-07-29 NOTE — ED Notes (Signed)
Time-out per Dr. Dema SeverinMungal. Consent given verbally by family to Dr. Dema SeverinMungal.

## 2015-07-29 NOTE — Procedures (Signed)
  Procedure Note: Right Femoral Central Venous Catheter Placement  Tiffany Gomez , [id number] , ED12A/ED12A  Indications: Hemodynamic monitoring / Intravenous access  Benefits, risks (including bleeding, infection,  Injury, etc.), and alternatives explained to Mother who voiced understanding.  Questions were sought and answered.   Mother agreed to proceed with the procedure.  Consent is signed and on chart. A time-out was completed verifying correct patient, procedure and site.  A 3 lumen catheter available at the time of procedure.  The patient was placed in a dependent position appropriate for central line placement based on the vein to be cannulated.   The patient's RIGHT Femoral Vein was prepped and draped in a sterile fashion.  1% Lidocaine WAS NOT used to anesthetize the surrounding skin area.   A3 lumen catheter was introduced into the RIGHT Femoral Vein using Seldinger technique, visualized under ultrasound.  The catheter was threaded smoothly over the guide wire and appropriate blood return was obtained.  Each lumen of the catheter was evacuated of air and flushed with sterile saline.  The catheter was then sutured in place to the skin and a sterile dressing applied.  Perfusion to the extremity distal to the point of catheter insertion was checked and found to be adequate.     The patient tolerated the procedure well and there were no complications.  Stephanie AcreVishal Lamaj Metoyer, MD [place] [facility] Pager [phone]- [phone] (please enter 7-digits) On Call Pager - [phone] (please enter 7-digits)

## 2015-07-29 NOTE — ED Notes (Addendum)
Dr. Dema SeverinMungal at bedside to insert Trialysis catheter

## 2015-07-29 NOTE — Progress Notes (Signed)
ANTIBIOTIC CONSULT NOTE - INITIAL  Pharmacy Consult for Vancomycin  Indication: sepsis  No Known Allergies  Patient Measurements: Weight: (!) 389 lb 4.8 oz (176.585 kg) Adjusted Body Weight: 106.2 kg   Vital Signs: Temp: 97.6 F (36.4 C) (04/26 1708) Temp Source: Oral (04/26 1708) BP: 113/78 mmHg (04/26 2156) Pulse Rate: 114 (04/26 2156) Intake/Output from previous day:   Intake/Output from this shift:    Labs:  Recent Labs  07/29/15 1713  WBC 30.2*  HGB 11.7*  PLT 396  CREATININE 5.83*   Estimated Creatinine Clearance: 20 mL/min (by C-G formula based on Cr of 5.83). No results for input(s): VANCOTROUGH, VANCOPEAK, VANCORANDOM, GENTTROUGH, GENTPEAK, GENTRANDOM, TOBRATROUGH, TOBRAPEAK, TOBRARND, AMIKACINPEAK, AMIKACINTROU, AMIKACIN in the last 72 hours.   Microbiology: No results found for this or any previous visit (from the past 720 hour(s)).  Medical History: Past Medical History  Diagnosis Date  . Diabetes ([place])   . Sleep apnea   . Leg pain   . Asthma   . Depression   . Morbid obesity ([place])   . Essential hypertension   . Hyperlipidemia   . Anxiety     Panic attacks    Medications:  Scheduled:  . [START ON 07/30/2015] antiseptic oral rinse  7 mL Mouth Rinse QID  . chlorhexidine gluconate (SAGE KIT)  15 mL Mouth Rinse BID  . enoxaparin (LOVENOX) injection  30 mg Subcutaneous Q24H  . insulin aspart  2-6 Units Subcutaneous Q4H  . ipratropium-albuterol  3 mL Nebulization Q6H   Assessment: Pharmacy consulted to dose vancomycin in this 50 year old obese female.  Pt in ARF, RN states they will probably start HD tonight (4/26).  HD schedule not established yet.   Goal of Therapy:  Vanc trough 15 - 25 mcg/mL   Plan:  Expected duration 7 days with resolution of temperature and/or normalization of WBC   Vancomycin 1 gm IV X 1 given on 4/26 @ 18:00. Vancomyinc 1500 mg IV X 1 ordered to be given following initial 1 gm dose to make total loading dose of 2500  mg.  Will order vanc trough and supplemental vanc doses once HD schedule is set.   Anasophia Pecor D 07/29/2015,10:03 PM

## 2015-07-29 NOTE — ED Notes (Signed)
Labs at 2000 delayed due to patient being in procedures. CCU RN aware

## 2015-07-29 NOTE — ED Notes (Addendum)
Triple lumen catheter placed in right femoral vein by Dr. Dema SeverinMungal.

## 2015-07-29 NOTE — ED Notes (Signed)
Unable to insert left central line per Dr. Dema SeverinMungal.

## 2015-07-29 NOTE — Procedures (Signed)
  Pro[facility]  Tiffany Gomez , [id number] , ED12A/ED12A  Indications: Hemodynamic monitoring / Intravenous access  Benefits, risks (including bleeding, infection,  Injury, etc.), and alternatives explained to Mother who voiced understanding.  Questions were sought and answered.   Mother agreed to proceed with the procedure.  Consent is signed and on chart. A time-out was completed verifying correct patient, procedure and site.  A 2 lumen catheter available at the time of procedure.  The patient was placed in a dependent position appropriate for central line placement based on the vein to be cannulated.   The patient's RIGHT Internal Jugular Vein was prepped and draped in a sterile fashion.  1% Lidocaine WAS NOT used to anesthetize the surrounding skin area.   A 32lumen catheter was introduced into the RIGHT Internal Jugular Vein using Seldinger technique, visualized under ultrasound.  The catheter was threaded smoothly over the guide wire and appropriate blood return was obtained.  Each lumen of the catheter was evacuated of air and flushed with sterile saline.  The catheter was then sutured in place to the skin and a sterile dressing applied.  Perfusion to the extremity distal to the point of catheter insertion was checked and found to be adequate.    Chest X-ray was ordered for confirmation of placement.  There was also an attempt to place a LIJ CVL, but due to the RIJ vascath being in place the wire for the LIJ could not advance, and the LIJ CVL attempt was aborted.   The patient tolerated the procedure well and there were no complications.  Stephanie AcreVishal Didi Ganaway, MD [place] [facility] Pager [phone]- [phone] (please enter 7-digits) On Call Pager - [phone] (please enter 7-digits)

## 2015-07-29 NOTE — ED Notes (Addendum)
Unable to insert trialysis. Dialysis catheter inserted on right IJ.

## 2015-07-29 NOTE — ED Notes (Signed)
Family states this has happened before due to a fall out of the bed. Friend  Of the patient states she thiniks the patient was found on the floor by first responders today.

## 2015-07-29 NOTE — Consult Note (Signed)
Pharmacy Antibiotic Note  Tiffany Gomez is a 50 y.o. female admitted on 07/29/2015 with sepsis.  Pharmacy has been consulted for vancomycin, metronidazole, zosyn dosing.  Plan: Pt received 1g of vancomycin in the ED. Per ICU Dr. Rock NephewPt is being started on CCRT tonight due to rhabdomyolysis.  Zosyn 3.375g q 8 hours  Vancomycin 1g q 24 hours trough prior to third dose. Continue to monitor- dose adjustments will be needed if pt is transitioned to HD   Weight: (!) 389 lb 4.8 oz (176.585 kg)  Temp (24hrs), Avg:97.6 F (36.4 C), Min:97.6 F (36.4 C), Max:97.6 F (36.4 C)   Recent Labs Lab 07/29/15 1713  WBC 30.2*  CREATININE 5.83*    Estimated Creatinine Clearance: 20 mL/min (by C-G formula based on Cr of 5.83).    No Known Allergies  Antimicrobials this admission: 4/26 vancomycin >>  4/26 zosyn >>  4/26 metronidazole >>  Dose adjustments this admission:   Microbiology results: 4/26 BCx: pending 4/26 UCx: pending    Thank you for allowing pharmacy to be a part of this patient's care.  Olene FlossMelissa D Dushawn Pusey, Pharm.D Clinical Pharmacist   07/29/2015 8:32 PM

## 2015-07-29 NOTE — ED Notes (Addendum)
Per EMS report, patient's mother called for a welfare check today, stating that she hadn't talked to her daughter since Monday 4/24 and usually speaks with her daily. EMS states patient was disoriented and lethargic upon their arrival. Patient was given 2mg  Narcan in the ambulance without a change in mentation. Patient had Percocet in the house. Patient's friend states she spoke with the patient yesterday and the patient sounded drowsy then. Patient did not recognize her friend and was disoriented to the date. Patient told her friend that she didn't know what happened, but she thought she fell at work. Patient's friend states that the patient does not work. Patient was seen in the pain clinic on 4/24. Patient's friend states patient had vomiting and diarrhea for 5 days and saw PMD at [facility] early last week.

## 2015-07-29 NOTE — ED Provider Notes (Addendum)
[facility] Emergency Department Provider Note  Time seen: 5:02 PM  I have reviewed the triage vital signs and the nursing notes.   HISTORY  Chief Complaint Altered Mental Status    HPI Tiffany Gomez is a 50 y.o. female with a past medical history of diabetes, asthma, depression, hypertension, hyperlipidemia, anxiety, morbid obesity, who presents to the emergency department with altered mental status. According to EMS report the mom had not heard from the patient and several days so she went to the patient's home and found her largely unresponsive on the bed. EMS was called. EMS states upon their arrival they found the patient to be quite altered, did not know who she was or where she was. Minimal responsiveness. Patient does take pain medication and a dose of Narcan with some response per EMS. Initially they state the patient had a blood pressure of 70 over palpable. After starting IV fluids and use of Narcan the patient's blood pressures currently 110 systolic upon arrival. Patient remains altered, cannot provide a history. States the year is 1917, does not know where she is, but is able to provide her name. When asked if she is in any pain patient states no, but her answers are questionable at this time.     Past Medical History  Diagnosis Date  . Diabetes (HCC)   . Sleep apnea   . Leg pain   . Asthma   . Depression   . Morbid obesity (HCC)   . Essential hypertension   . Hyperlipidemia   . Anxiety     Panic attacks    Patient Active Problem List   Diagnosis Date Noted  . f 09/02/2014  . Lumbar radiculitis 09/02/2014  . Lumbar radiculopathy, acute 09/02/2014  . Facet syndrome, lumbar 09/02/2014  . DDD (degenerative disc disease), cervical 09/01/2014  . HNP (herniated nucleus pulposus) with myelopathy, cervical 06/16/2014  . Morbid obesity (HCC) 06/16/2014  . Cervical herniated disc 06/16/2014  . Pain in the chest 06/01/2014  . Essential  hypertension   . Hyperlipidemia     Past Surgical History  Procedure Laterality Date  . Gastric bypass    . Laparoscopic cholecystectomy    . Cardiac catheterization    . Anterior cervical decomp/discectomy fusion N/A 06/16/2014    Procedure: ANTERIOR CERVICAL DECOMPRESSION/DISCECTOMY FUSION CERVICAL FIVE-SIX;  Surgeon: Julio Sicks, MD;  Location: [facility];  Service: Neurosurgery;  Laterality: N/A;    Current Outpatient Rx  Name  Route  Sig  Dispense  Refill  . albuterol (PROVENTIL HFA;VENTOLIN HFA) 108 (90 BASE) MCG/ACT inhaler   Inhalation   Inhale 2 puffs into the lungs every 6 (six) hours as needed for wheezing or shortness of breath.         . alprazolam (XANAX) 2 MG tablet   Oral   Take 2 mg by mouth 3 (three) times daily as needed for anxiety.         . cefUROXime (CEFTIN) 250 MG tablet   Oral   Take 1 tablet (250 mg total) by mouth 2 (two) times daily with a meal. Patient not taking: Reported on 07/27/2015   14 tablet   0   . cefUROXime (CEFTIN) 250 MG tablet   Oral   Take 1 tablet (250 mg total) by mouth 2 (two) times daily with a meal. Patient not taking: Reported on 07/27/2015   14 tablet   0   . cefUROXime (CEFTIN) 250 MG tablet   Oral   Take  1 tablet (250 mg total) by mouth 2 (two) times daily with a meal. Patient not taking: Reported on 07/27/2015   14 tablet   0   . cefUROXime (CEFTIN) 250 MG tablet   Oral   Take 1 tablet (250 mg total) by mouth 2 (two) times daily with a meal. Patient not taking: Reported on 07/27/2015   14 tablet   0   . cefUROXime (CEFTIN) 500 MG tablet      Limit 1 tab by mild price per day if tolerated Patient not taking: Reported on 07/27/2015   30 tablet   0   . cefUROXime (CEFTIN) 500 MG tablet      Limit 1 tablet by mouth twice per day if tolerated Patient not taking: Reported on 07/27/2015   30 tablet   0   . clonazePAM (KLONOPIN) 1 MG tablet   Oral   Take 1 mg by mouth at bedtime.       4   .  cyclobenzaprine (FLEXERIL) 5 MG tablet   Oral   Take 5 mg by mouth 2 (two) times daily as needed for muscle spasms.         Marland Kitchen. glipiZIDE (GLUCOTROL) 5 MG tablet   Oral   Take 5 mg by mouth daily before breakfast.         . HYDROcodone-acetaminophen (NORCO/VICODIN) 5-325 MG per tablet   Oral   Take 1-2 tablets by mouth every 4 (four) hours as needed (mild pain).   60 tablet   0   . ibuprofen (ADVIL,MOTRIN) 800 MG tablet   Oral   Take by mouth as needed.       2   . linagliptin (TRADJENTA) 5 MG TABS tablet   Oral   Take 5 mg by mouth daily.         Marland Kitchen. lisinopril (PRINIVIL,ZESTRIL) 2.5 MG tablet   Oral   Take 2.5 mg by mouth daily.       2   . megestrol (MEGACE) 40 MG tablet   Oral   Take 40 mg by mouth daily.       3   . metFORMIN (GLUCOPHAGE) 500 MG tablet   Oral   Take 500 mg by mouth 2 (two) times daily with a meal.       5   . montelukast (SINGULAIR) 10 MG tablet   Oral   Take 10 mg by mouth at bedtime.         . Oxycodone HCl 10 MG TABS      Limit 1 tablet by mouth per day or  3 - 5  times per day if tolerated   150 tablet   0   . venlafaxine (EFFEXOR) 75 MG tablet   Oral   Take 75 mg by mouth 2 (two) times daily with a meal.       2   . ziprasidone (GEODON) 80 MG capsule   Oral   Take 160 mg by mouth at bedtime.       2     Allergies No known allergies  Family History  Problem Relation Age of Onset  . Heart failure Mother     Social History Social History  Substance Use Topics  . Smoking status: Never Smoker   . Smokeless tobacco: Not on file  . Alcohol Use: No    Review of Systems Unable to obtain an intact review of systems due to altered mental status.  ____________________________________________   PHYSICAL EXAM:  Constitutional:  Alert. Oriented to person only. Morbidly obese. Eyes: 3 mm bilateral, PERRL. ENT   Head: Normocephalic and atraumatic.   Mouth/Throat: Dry mucous membranes. Cardiovascular:  Regular rhythm, rate around 100 bpm. Respiratory: Normal respiratory effort without tachypnea nor retractions. Breath sounds are clear Gastrointestinal: Morbidly obese abdomen, however soft, no reaction to palpation. Musculoskeletal: Appears to be able to move all 4 extremities. Neurologic:  Somewhat slurred speech, appears to move all extremities without gross deficit. Skin:  Skin is warm, dry and intact.  Psychiatric: Patient is altered, oriented to person only. Appears agitated at times.  ____________________________________________    EKG  EKG reviewed and interpreted by myself shows sinus tachycardia at 103 bpm, narrow QRS, normal axis, largely normal intervals, low voltage, nonspecific ST changes.  ____________________________________________    RADIOLOGY  Chest x-ray shows mild vascular congestion  ____________________________________________   INITIAL IMPRESSION / ASSESSMENT AND PLAN / ED COURSE  Pertinent labs & imaging results that were available during my care of the patient were reviewed by me and considered in my medical decision making (see chart for details).  The patient presents to the emergency department with altered mental status. Blood pressure approximately 110 systolic upon arrival. Patient oriented to self only. We will check labs, EKG, chest x-ray, and monitor very closely. At this time is not clear what is causing the altered mental status. Pupils appear normal to dilated, do not suspect opiate overdose at this time but we will continue to monitor while awaiting lab results.  Chest x-ray shows mild vascular congestion. Patient's pH has resulted 7.1 on a VBG. Urine drug screen positive for TCA as well as opiates, patient is prescribed opiates. Patient's chemistry has resulted significant for acute renal failure with creatinine of 5.83 with what appears to be hyperkalemia 6.9. Patient has an anion gap of 18. Also has AST/ALT elevation. Given her acute renal  failure we will continue with IV fluids. Given her white blood cell count of 30,000 IV initiated sepsis protocols and the patient is receiving antibiotics. Urinalysis appears clear. Chest x-ray appears clear. Patient is receiving IV fluids, given her hyperkalemia I've treated with 1 amp of D50 as well as 10 units of IV insulin. Patient's blood pressure remains on the lower and with a map of 62 currently. We'll continue with IV fluids and admitted to the intensive care unit for further treatment. Lab states patient's CK is significantly elevated, but they are continuing to diluted to obtain the value.  Patient continues to improve clinically. Now is awake, alert, oriented 3, can answer questions appropriately including birth date and year. States she does not remember coming to the hospital but does remember briefly being in an ambulance.   I discussed the patient with critical care medicine they'll be admitting to the ICU for further treatment. Patient's CK is resulted at 9000.  CRITICAL CARE Performed by: Minna Antis   Total critical care time: 90 minutes  Critical care time was exclusive of separately billable procedures and treating other patients.  Critical care was necessary to treat or prevent imminent or life-threatening deterioration.  Critical care was time spent personally by me on the following activities: development of treatment plan with patient and/or surrogate as well as nursing, discussions with consultants, evaluation of patient's response to treatment, examination of patient, obtaining history from patient or surrogate, ordering and performing treatments and interventions, ordering and review of laboratory studies, ordering and review of radiographic studies, pulse oximetry and re-evaluation of patient's condition.   The patient's  clinical status had deteriorated somewhat, needing his cussed stimulation to awaken and would fall asleep quickly. Given the patient's  significant medical issues, the intensivist is here, we'll place a vas catheter to take the patient to dialysis later this evening. We have decided to go ahead and intubate the patient given imminent respiratory failure as the patient is fatiguing quickly. Family agreeable to plan.  INTUBATION Performed by: Minna Antis  Required items: required blood products, implants, devices, and special equipment available Patient identity confirmed: provided demographic data and hospital-assigned identification number Time out: Immediately prior to procedure a "time out" was called to verify the correct patient, procedure, equipment, support staff and site/side marked as required.  Indications: Impending respiratory failure   Intubation method: 4.0 Glidescope Laryngoscopy   Preoxygenation: 100% BVM  Sedatives: 40 Etomidate Paralytic: 120 mg rocuronium   Tube Size: 7.5 cuffed, 24th the teeth   Post-procedure assessment: chest rise and ETCO2 monitor Breath sounds: equal and absent over the epigastrium Tube secured with: ETT holder Chest x-ray interpreted by radiologist and me.  Chest x-ray findings: endotracheal tube in appropriate position  Patient tolerated the procedure well with no immediate complications.      ____________________________________________   FINAL CLINICAL IMPRESSION(S) / ED DIAGNOSES  Altered mental status Hyperkalemia Acute renal failure Rhabdomyolysis Hypotension  Minna Antis, MD 07/29/15 [id number]  Minna Antis, MD 07/29/15 2006

## 2015-07-29 NOTE — ED Notes (Signed)
Dr. Dema SeverinMungal at bedside to insert triple lumen cental line catheter on left IJ.

## 2015-07-29 NOTE — H&P (Signed)
PULMONARY / CRITICAL CARE MEDICINE   Name: Tiffany Gomez MRN: [id number] DOB: [birth year]    ADMISSION DATE:  07/29/2015 CONSULTATION DATE:  07/29/15  REFERRING MD : Dr. Lenard Lance (ER)   CHIEF COMPLAINT:    Found down, AMS   HISTORY OF PRESENT ILLNESS    50 yo female with PMHX of Morbid obesity, DM, Ashtma, Depression, HLD, C5-6 anterior cervical discectomy with interody fusion, depression, gastric bypass, found down by EMS at home.  Hx per mother since patient is altered and lethargic.  Mother said that she spoke with patient about 3p yesterday, didn't hear from her lastnight or today, at which point Police was called, she was found on the side of the bed, EMS brought her to the ER.  Upon arrival to the ER her mentation continued to decline, she was found to be in acute rhabdomyolsis with acute renal failure and septic shock.  She was intubated in the ER for airway protection. Off note, family states that for the past 2 weeks she has had profound diarrhea, and took "an antibiotic" for 5 days, which was given by per PMD.    Per family she has had 2 falls in the recent past. Family also stated that she was last seen on Monday when she went to her PMD [facility]) for follow up visit. She may have fallen sometime early yesterday evening, off the side of her bed.    SIGNIFICANT EVENTS   4/26>>found altered and down on the side of the bed by EMS, possible fall due to lethargy and shock (Diarrheax 2weeks), brought to the ER, intubated, vascath placed.    PAST MEDICAL HISTORY    :  Past Medical History  Diagnosis Date  . Diabetes (HCC)   . Sleep apnea   . Leg pain   . Asthma   . Depression   . Morbid obesity (HCC)   . Essential hypertension   . Hyperlipidemia   . Anxiety     Panic attacks   Past Surgical History  Procedure Laterality Date  . Gastric bypass    . Laparoscopic cholecystectomy    . Cardiac catheterization    . Anterior cervical decomp/discectomy fusion  N/A 06/16/2014    Procedure: ANTERIOR CERVICAL DECOMPRESSION/DISCECTOMY FUSION CERVICAL FIVE-SIX;  Surgeon: Julio Sicks, MD;  Location: [facility];  Service: Neurosurgery;  Laterality: N/A;   Prior to Admission medications   Medication Sig Start Date End Date Taking? Authorizing Provider  albuterol (PROVENTIL HFA;VENTOLIN HFA) 108 (90 BASE) MCG/ACT inhaler Inhale 2 puffs into the lungs every 6 (six) hours as needed for wheezing or shortness of breath.   Yes Historical Provider, MD  alprazolam Prudy Feeler) 2 MG tablet Take 2 mg by mouth 4 (four) times daily as needed for anxiety.    Yes Historical Provider, MD  clonazePAM (KLONOPIN) 1 MG tablet Take 1 mg by mouth at bedtime.    Yes Historical Provider, MD  cyclobenzaprine (FLEXERIL) 5 MG tablet Take 5 mg by mouth every 8 (eight) hours as needed for muscle spasms.    Yes Historical Provider, MD  gabapentin (NEURONTIN) 300 MG capsule Take 300-600 mg by mouth 3 (three) times daily as needed (for pain).   Yes Historical Provider, MD  glipiZIDE (GLUCOTROL XL) 5 MG 24 hr tablet Take 5 mg by mouth daily with breakfast.   Yes Historical Provider, MD  ibuprofen (ADVIL,MOTRIN) 800 MG tablet Take 800 mg by mouth every 8 (eight) hours as needed for headache or mild pain.  Yes Historical Provider, MD  linagliptin (TRADJENTA) 5 MG TABS tablet Take 5 mg by mouth daily.   Yes Historical Provider, MD  lisinopril (PRINIVIL,ZESTRIL) 5 MG tablet Take 5 mg by mouth daily.   Yes Historical Provider, MD  megestrol (MEGACE) 40 MG tablet Take 40 mg by mouth daily.    Yes Historical Provider, MD  metFORMIN (GLUCOPHAGE) 500 MG tablet Take 500 mg by mouth 2 (two) times daily with a meal.    Yes Historical Provider, MD  montelukast (SINGULAIR) 10 MG tablet Take 10 mg by mouth at bedtime.   Yes Historical Provider, MD  Oxycodone HCl 10 MG TABS Take 10 mg by mouth 5 (five) times daily as needed (for pain).   Yes Historical Provider, MD  pravastatin (PRAVACHOL) 20 MG tablet Take 20  mg by mouth at bedtime.   Yes Historical Provider, MD  venlafaxine (EFFEXOR) 75 MG tablet Take 75 mg by mouth 2 (two) times daily with a meal.    Yes Historical Provider, MD  ziprasidone (GEODON) 80 MG capsule Take 160 mg by mouth daily with supper.    Yes Historical Provider, MD   No Known Allergies   FAMILY HISTORY   Family History  Problem Relation Age of Onset  . Heart failure Mother       SOCIAL HISTORY    reports that she has never smoked. She does not have any smokeless tobacco history on file. She reports that she does not drink alcohol or use illicit drugs.  Review of Systems  Unable to perform ROS: mental acuity      VITAL SIGNS    Temp:  [97.6 F (36.4 C)] 97.6 F (36.4 C) (04/26 1708) Pulse Rate:  [102-104] 102 (04/26 1905) Resp:  [20-24] 20 (04/26 1905) BP: (68-116)/(54-89) 68/54 mmHg (04/26 1905) SpO2:  [98 %-100 %] 100 % (04/26 1905) Weight:  [389 lb 4.8 oz (176.585 kg)] 389 lb 4.8 oz (176.585 kg) (04/26 1708) HEMODYNAMICS:   VENTILATOR SETTINGS:   INTAKE / OUTPUT: No intake or output data in the 24 hours ending 07/29/15 1929     PHYSICAL EXAM   Physical Exam  Constitutional: She appears well-developed and well-nourished.  HENT:  Head: Normocephalic and atraumatic.  Right Ear: External ear normal.  Left Ear: External ear normal.  Dry mucosa  Eyes: Pupils are equal, round, and reactive to light.  Neck: Normal range of motion.  Cardiovascular: Normal rate, normal heart sounds and intact distal pulses.   Pulmonary/Chest: Breath sounds normal.  Distant breath sounds  Abdominal: Soft. Bowel sounds are normal. She exhibits no distension. There is no tenderness.  Genitourinary: Vagina normal.  Neurological: She is alert.  Initially alert, but confused and lethargic.  Intubated due to inc lethargy and airway Protection  Skin: Skin is warm and dry.  Nursing note and vitals reviewed.      LABS   LABS:  CBC  Recent Labs Lab  07/29/15 1713  WBC 30.2*  HGB 11.7*  HCT 37.9  PLT 396   Coag's No results for input(s): APTT, INR in the last 168 hours. BMET  Recent Labs Lab 07/29/15 1713  NA 132*  K 6.9*  CL 98*  CO2 16*  BUN 36*  CREATININE 5.83*  GLUCOSE 238*   Electrolytes  Recent Labs Lab 07/29/15 1713  CALCIUM 7.8*   Sepsis Markers No results for input(s): LATICACIDVEN, PROCALCITON, O2SATVEN in the last 168 hours. ABG No results for input(s): PHART, PCO2ART, PO2ART in the last 168 hours. Liver  Enzymes  Recent Labs Lab 07/29/15 1713  AST 183*  ALT 73*  ALKPHOS 96  BILITOT 0.6  ALBUMIN 3.2*   Cardiac Enzymes  Recent Labs Lab 07/29/15 1713  TROPONINI 0.12*   Glucose No results for input(s): GLUCAP in the last 168 hours.   No results found for this or any previous visit (from the past 240 hour(s)).   Current facility-administered medications:  .  bupivacaine (PF) (MARCAINE) 0.25 % injection 30 mL, 30 mL, Other, Once, Ewing Schlein, MD .  ceFAZolin (ANCEF) IVPB 1 g/50 mL premix, 1 g, Intravenous, Once, Ewing Schlein, MD .  ceFAZolin (ANCEF) IVPB 1 g/50 mL premix, 1 g, Intravenous, Once, Ewing Schlein, MD .  ceFAZolin (ANCEF) IVPB 1 g/50 mL premix, 1 g, Intravenous, Once, Ewing Schlein, MD .  ceFAZolin (ANCEF) IVPB 1 g/50 mL premix, 1 g, Intravenous, Once, Ewing Schlein, MD .  dextrose 50 % solution 25 g, 25 g, Intravenous, Once, Minna Antis, MD .  insulin aspart (novoLOG) injection 10 Units, 10 Units, Intravenous, Once, Minna Antis, MD .  lactated ringers infusion 1,000 mL, 1,000 mL, Intravenous, Continuous, Ewing Schlein, MD .  lactated ringers infusion 1,000 mL, 1,000 mL, Intravenous, Continuous, Ewing Schlein, MD .  lactated ringers infusion 1,000 mL, 1,000 mL, Intravenous, Continuous, Ewing Schlein, MD .  lidocaine (PF) (XYLOCAINE) 1 % injection 10 mL, 10 mL, Subcutaneous, Once, Ewing Schlein, MD .  orphenadrine (NORFLEX) injection 60 mg, 60 mg, Intramuscular,  Once, Ewing Schlein, MD .  sodium chloride 0.9 % bolus 1,000 mL, 1,000 mL, Intravenous, Q1H, Last Rate: 1,000 mL/hr at 07/29/15 1927, 1,000 mL at 07/29/15 1927 **AND** sodium chloride 0.9 % bolus 500 mL, 500 mL, Intravenous, Q1H, Minna Antis, MD, Last Rate: 500 mL/hr at 07/29/15 1926, 500 mL at 07/29/15 1926 .  sodium chloride 0.9 % injection 20 mL, 20 mL, Other, Once, Ewing Schlein, MD .  sodium chloride flush (NS) 0.9 % injection 20 mL, 20 mL, Other, Once, Ewing Schlein, MD .  triamcinolone acetonide (KENALOG-40) injection 40 mg, 40 mg, Other, Once, Ewing Schlein, MD .  triamcinolone acetonide (KENALOG-40) injection 40 mg, 40 mg, Other, Once, Ewing Schlein, MD .  vancomycin (VANCOCIN) IVPB 1000 mg/200 mL premix, 1,000 mg, Intravenous, Once, Minna Antis, MD, Last Rate: 200 mL/hr at 07/29/15 1840, 1,000 mg at 07/29/15 1840  Current outpatient prescriptions:  .  albuterol (PROVENTIL HFA;VENTOLIN HFA) 108 (90 BASE) MCG/ACT inhaler, Inhale 2 puffs into the lungs every 6 (six) hours as needed for wheezing or shortness of breath., Disp: , Rfl:  .  alprazolam (XANAX) 2 MG tablet, Take 2 mg by mouth 4 (four) times daily as needed for anxiety. , Disp: , Rfl:  .  clonazePAM (KLONOPIN) 1 MG tablet, Take 1 mg by mouth at bedtime. , Disp: , Rfl: 4 .  cyclobenzaprine (FLEXERIL) 5 MG tablet, Take 5 mg by mouth every 8 (eight) hours as needed for muscle spasms. , Disp: , Rfl:  .  gabapentin (NEURONTIN) 300 MG capsule, Take 300-600 mg by mouth 3 (three) times daily as needed (for pain)., Disp: , Rfl:  .  glipiZIDE (GLUCOTROL XL) 5 MG 24 hr tablet, Take 5 mg by mouth daily with breakfast., Disp: , Rfl:  .  ibuprofen (ADVIL,MOTRIN) 800 MG tablet, Take 800 mg by mouth every 8 (eight) hours as needed for headache or mild pain. , Disp: , Rfl: 2 .  linagliptin (TRADJENTA) 5 MG TABS tablet, Take 5 mg by mouth daily., Disp: , Rfl:  .  lisinopril (PRINIVIL,ZESTRIL) 5 MG tablet, Take 5 mg by mouth daily., Disp: ,  Rfl:  .  megestrol (MEGACE) 40 MG tablet, Take 40 mg by mouth daily. , Disp: , Rfl: 3 .  metFORMIN (GLUCOPHAGE) 500 MG tablet, Take 500 mg by mouth 2 (two) times daily with a meal. , Disp: , Rfl: 5 .  montelukast (SINGULAIR) 10 MG tablet, Take 10 mg by mouth at bedtime., Disp: , Rfl:  .  Oxycodone HCl 10 MG TABS, Take 10 mg by mouth 5 (five) times daily as needed (for pain)., Disp: , Rfl:  .  pravastatin (PRAVACHOL) 20 MG tablet, Take 20 mg by mouth at bedtime., Disp: , Rfl:  .  venlafaxine (EFFEXOR) 75 MG tablet, Take 75 mg by mouth 2 (two) times daily with a meal. , Disp: , Rfl: 2 .  ziprasidone (GEODON) 80 MG capsule, Take 160 mg by mouth daily with supper. , Disp: , Rfl: 2  IMAGING    Dg Chest Portable 1 View  07/29/2015  CLINICAL DATA:  Acute onset of disorientation and lethargy. Vomiting and diarrhea. Initial encounter. EXAM: PORTABLE CHEST 1 VIEW COMPARISON:  Chest radiograph performed 05/30/2014 FINDINGS: The lungs are hypoexpanded. Mild vascular crowding and vascular congestion are seen. There is no evidence of focal opacification, pleural effusion or pneumothorax. The cardiomediastinal silhouette is borderline enlarged. No acute osseous abnormalities are seen. IMPRESSION: Mild vascular congestion and borderline cardiomegaly. Lungs remain grossly clear. Electronically Signed   By: Roanna Raider M.D.   On: 07/29/2015 18:26      Indwelling Urinary Catheter continued, requirement due to   Reason to continue Indwelling Urinary Catheter for strict Intake/Output monitoring for hemodynamic instability   Central Line continued, requirement due to   Reason to continue [employer] Monitoring of central venous pressure or other hemodynamic parameters   Ventilator continued, requirement due to, resp failure    Ventilator Sedation RASS 0 to -2   Cultures: BCx2  UC  Sputum Stool  Antibiotics: Vanc Zosyn Flagyl  Lines: RIJ Vascath 4/26>> R Fem CVL 4/26>>   ASSESSMENT/PLAN   50 yo with PMHx of DM, Morbid obesity, C5-6 repair, Asthma, HTN, HLD, now with septic shock, acute renal failure and rhabdomyolysis.  Intubated in the ER for airway protection due to AMS/lethargy/Sepsis and impending respiratory failure.   PULMONARY OETT 4/26 VDRF Hypercarbia Sepsis Hypovoluemic shock ?OSA/OHS Rhabdo ARF  P:   - MV wean as tolerated - VAP protocol - WUA/SBT daily - Propofol for sedation, fentanyl for analgeisa - ABG prn - CXR now in the AM - plan for crrt tonight   CARDIOVASCULAR CVL HTN Hypotension Shock - septic and hypovoluemic Hyperkalemia Elevated troponin P:  - maintain MAP>65 - vasopressor as needed - hold antihypertensives - hemodynamic monitoring - EKG>> - ECHO>> - trend CE x 3  RENAL ARF Rhabdomyolysis Hyperkalemia Severe Metabolic Acidosis and mild respiratory acidosis Elevated Creatinine P:   - spoke with nephro, plan for CRRT tonight, possible HD - nephro following - bicarb gtt - serial BMP - ABG as needed - ICU electrolyte replacment - trend CK levels   GASTROINTESTINAL Diarrhea Hx of gastric Bypass P:   - diarrhea x 2 weeks per family - empiric Flagyl - stool culture - may consider CT A/P   HEMATOLOGIC Leukocytosis Sepsis P:  -leukomoid reaction and sepsis -treat underlying condition -source of sepsis unknown, most likely GI given history of recent diarrhea -follow CBC  INFECTIOUS Sepsis - possible GI source P:   - abx as  stated above - PCT - lactic acid - follow up cultures  ENDOCRINE DM P:   -ICU Hypo/hyperglycemia protocol  NEUROLOGIC AMS C5-6 Repair Depression 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  Family Update - spoke with mother, aunt and best friend at bedside, updated on clinical status and management plan  FULL CODE    I have personally obtained  a history, examined the patient, evaluated laboratory and imaging results, formulated the assessment and plan and placed orders.  The Patient requires high complexity decision making for assessment and support, frequent evaluation and titration of therapies, application of advanced monitoring technologies and extensive interpretation of multiple databases. Critical Care Time devoted to patient care services described in this note is 90 minutes.   Overall, patient is critically ill, prognosis is guarded. Patient at high risk for cardiac arrest and death.   Stephanie Acre, MD [facility] Pager [phone] (please enter 7-digits) On Call Pager - [phone] (please enter 7-digits)     07/29/2015, 7:29 PM  Note: This note was prepared with Dragon dictation along with smaller phrase technology. Any transcriptional errors that result from this process are unintentional.

## 2015-07-30 ENCOUNTER — Inpatient Hospital Stay: Payer: Medicare Other

## 2015-07-30 ENCOUNTER — Inpatient Hospital Stay: Admit: 2015-07-30 | Payer: Medicare Other

## 2015-07-30 DIAGNOSIS — T796XXA Traumatic ischemia of muscle, initial encounter: Secondary | ICD-10-CM

## 2015-07-30 LAB — BLOOD CULTURE ID PANEL (REFLEXED)
ACINETOBACTER BAUMANNII: NOT DETECTED
CANDIDA ALBICANS: NOT DETECTED
CANDIDA PARAPSILOSIS: NOT DETECTED
CARBAPENEM RESISTANCE: NOT DETECTED
Candida glabrata: NOT DETECTED
Candida krusei: NOT DETECTED
Candida tropicalis: NOT DETECTED
ENTEROBACTER CLOACAE COMPLEX: NOT DETECTED
ENTEROBACTERIACEAE SPECIES: NOT DETECTED
ENTEROCOCCUS SPECIES: NOT DETECTED
Escherichia coli: NOT DETECTED
HAEMOPHILUS INFLUENZAE: NOT DETECTED
KLEBSIELLA PNEUMONIAE: NOT DETECTED
Klebsiella oxytoca: NOT DETECTED
Listeria monocytogenes: NOT DETECTED
METHICILLIN RESISTANCE: NOT DETECTED
NEISSERIA MENINGITIDIS: NOT DETECTED
PSEUDOMONAS AERUGINOSA: NOT DETECTED
Proteus species: NOT DETECTED
STAPHYLOCOCCUS AUREUS BCID: NOT DETECTED
STAPHYLOCOCCUS SPECIES: DETECTED — AB
STREPTOCOCCUS AGALACTIAE: NOT DETECTED
STREPTOCOCCUS PYOGENES: NOT DETECTED
Serratia marcescens: NOT DETECTED
Streptococcus pneumoniae: NOT DETECTED
Streptococcus species: NOT DETECTED
VANCOMYCIN RESISTANCE: NOT DETECTED

## 2015-07-30 LAB — BLOOD GAS, ARTERIAL
ACID-BASE DEFICIT: 0.6 mmol/L (ref 0.0–2.0)
ALLENS TEST (PASS/FAIL): POSITIVE — AB
Bicarbonate: 25.8 mEq/L (ref 21.0–28.0)
FIO2: 0.6
LHR: 24 {breaths}/min
Mechanical Rate: 24
O2 SAT: 93.8 %
PATIENT TEMPERATURE: 37
PCO2 ART: 49 mmHg — AB (ref 32.0–48.0)
PEEP/CPAP: 5 cmH2O
PH ART: 7.33 — AB (ref 7.350–7.450)
VT: 500 mL
pO2, Arterial: 75 mmHg — ABNORMAL LOW (ref 83.0–108.0)

## 2015-07-30 LAB — BASIC METABOLIC PANEL
ANION GAP: 10 (ref 5–15)
BUN: 27 mg/dL — AB (ref 6–20)
CHLORIDE: 101 mmol/L (ref 101–111)
CO2: 26 mmol/L (ref 22–32)
Calcium: 6.7 mg/dL — ABNORMAL LOW (ref 8.9–10.3)
Creatinine, Ser: 3.23 mg/dL — ABNORMAL HIGH (ref 0.44–1.00)
GFR calc Af Amer: 19 mL/min — ABNORMAL LOW (ref >60)
GFR calc non Af Amer: 16 mL/min — ABNORMAL LOW (ref >60)
Glucose, Bld: 204 mg/dL — ABNORMAL HIGH (ref 65–99)
POTASSIUM: 3.6 mmol/L (ref 3.5–5.1)
SODIUM: 137 mmol/L (ref 135–145)

## 2015-07-30 LAB — CK
CK TOTAL: 13410 U/L — AB (ref 38–234)
CK TOTAL: 16275 U/L — AB (ref 38–234)
Total CK: 15450 U/L — ABNORMAL HIGH (ref 38–234)
Total CK: 16650 U/L — ABNORMAL HIGH (ref 38–234)

## 2015-07-30 LAB — URINALYSIS COMPLETE WITH MICROSCOPIC (ARMC ONLY)
Bilirubin Urine: NEGATIVE
Glucose, UA: NEGATIVE mg/dL
Ketones, ur: NEGATIVE mg/dL
Leukocytes, UA: NEGATIVE
Nitrite: NEGATIVE
PH: 5 (ref 5.0–8.0)
PROTEIN: 100 mg/dL — AB
Specific Gravity, Urine: 1.016 (ref 1.005–1.030)

## 2015-07-30 LAB — CBC
HCT: 32.6 % — ABNORMAL LOW (ref 35.0–47.0)
Hemoglobin: 10.3 g/dL — ABNORMAL LOW (ref 12.0–16.0)
MCH: 26.8 pg (ref 26.0–34.0)
MCHC: 31.5 g/dL — ABNORMAL LOW (ref 32.0–36.0)
MCV: 85 fL (ref 80.0–100.0)
PLATELETS: 332 10*3/uL (ref 150–440)
RBC: 3.84 MIL/uL (ref 3.80–5.20)
RDW: 15.1 % — AB (ref 11.5–14.5)
WBC: 20.1 10*3/uL — AB (ref 3.6–11.0)

## 2015-07-30 LAB — PHOSPHORUS: PHOSPHORUS: 4.8 mg/dL — AB (ref 2.5–4.6)

## 2015-07-30 LAB — GLUCOSE, CAPILLARY
GLUCOSE-CAPILLARY: 185 mg/dL — AB (ref 65–99)
Glucose-Capillary: 184 mg/dL — ABNORMAL HIGH (ref 65–99)
Glucose-Capillary: 167 mg/dL — ABNORMAL HIGH (ref 65–99)
Glucose-Capillary: 215 mg/dL — ABNORMAL HIGH (ref 65–99)
Glucose-Capillary: 174 mg/dL — ABNORMAL HIGH (ref 65–99)

## 2015-07-30 LAB — MRSA PCR SCREENING: MRSA by PCR: NEGATIVE

## 2015-07-30 LAB — PROCALCITONIN
PROCALCITONIN: 2.13 ng/mL
Procalcitonin: 2.67 ng/mL

## 2015-07-30 LAB — TROPONIN I
Troponin I: 0.22 ng/mL — ABNORMAL HIGH (ref <0.031)
Troponin I: 0.39 ng/mL — ABNORMAL HIGH (ref <0.031)

## 2015-07-30 LAB — MAGNESIUM: MAGNESIUM: 1.5 mg/dL — AB (ref 1.7–2.4)

## 2015-07-30 LAB — STREP PNEUMONIAE URINARY ANTIGEN: STREP PNEUMO URINARY ANTIGEN: NEGATIVE

## 2015-07-30 MED ORDER — PRO-STAT SUGAR FREE PO LIQD
60.0000 mL | Freq: Three times a day (TID) | ORAL | Status: DC
  Administered 2015-07-30 (×2): 60 mL via ORAL

## 2015-07-30 MED ORDER — PIPERACILLIN-TAZOBACTAM 3.375 G IVPB
3.3750 g | Freq: Two times a day (BID) | INTRAVENOUS | Status: DC
  Administered 2015-07-30 – 2015-08-03 (×7): 3.375 g via INTRAVENOUS
  Filled 2015-07-30 (×9): qty 50

## 2015-07-30 MED ORDER — FAMOTIDINE IN NACL 20-0.9 MG/50ML-% IV SOLN
20.0000 mg | INTRAVENOUS | Status: DC
  Administered 2015-07-30 – 2015-07-31 (×2): 20 mg via INTRAVENOUS
  Filled 2015-07-30 (×4): qty 50

## 2015-07-30 MED ORDER — HEPARIN SODIUM (PORCINE) 5000 UNIT/ML IJ SOLN
5000.0000 [IU] | Freq: Three times a day (TID) | INTRAMUSCULAR | Status: DC
  Administered 2015-07-30 – 2015-08-05 (×18): 5000 [IU] via SUBCUTANEOUS
  Filled 2015-07-30 (×18): qty 1

## 2015-07-30 MED ORDER — VITAL HIGH PROTEIN PO LIQD
1000.0000 mL | ORAL | Status: DC
  Administered 2015-07-30: 1000 mL
  Administered 2015-07-31: 07:00:00

## 2015-07-30 MED ORDER — SODIUM CHLORIDE 0.9 % IV BOLUS (SEPSIS): 1000.0000 mL | Freq: Once | INTRAVENOUS | Status: DC

## 2015-07-30 NOTE — Progress Notes (Signed)
Initial Nutrition Assessment  DOCUMENTATION CODES:   Morbid obesity  INTERVENTION:  Received verbal order from Dr. Ardyth Manam to start enteral nutrition.  Recommend vital high protein at 2320ml/hr with prostat times 6 per day.  Will provide 1080 kcals and 132 g of protein (meets 89% of protein needs).  Will reassess goal rate in am with diprivan rate. Will likely need MVI if unable to increase TF rate to meet RDIs.   NUTRITION DIAGNOSIS:   Inadequate oral intake related to acute illness as evidenced by NPO status.    GOAL:   Provide needs based on ASPEN/SCCM guidelines    MONITOR:   Labs, TF tolerance  REASON FOR ASSESSMENT:   Ventilator    ASSESSMENT:   50 y/o female admitted with AMS, acute rhabdomylysis, diarrhea for the past 2 weeks.  Pt with acute respiratory failure intubated on 4/26 for airway protection, severe acidosis, emergent HD, sepsis. Past Medical History  Diagnosis Date  . Diabetes (HCC)   . Sleep apnea   . Leg pain   . Asthma   . Depression   . Morbid obesity (HCC)   . Essential hypertension   . Hyperlipidemia   . Anxiety     Panic attacks   History of gastric bypass noted at [facility] in 2013 by Dr. Smitty CordsBruce ?? Roux-n-y  Medications reviewed aspart, levophed, diprivan (31.8 ml/hr (839 kcals/d at current rate) Labs reviewed: BUN 27, creatinine 3.23, calcium 6.7, glucose 204, phosphorus 4.8, Mag 1.5  MV: 12 L/min Temp (24hrs), Avg:98.3 F (36.8 C), Min:97.6 F (36.4 C), Max:98.8 F (37.1 C)   Nutrition-Focused physical exam completed. Findings are no fat depletion, no muscle depletion, and mild edema.    Diet Order:  Diet NPO time specified  Skin:  Reviewed, no issues  Last BM:  none documented   Height:   Ht Readings from Last 1 Encounters:  07/30/15 5\' 6"  (1.676 m)    Weight: no wt loss noted per wt encounters  Wt Readings from Last 1 Encounters:  07/30/15 404 lb (183.253 kg)    Ideal Body Weight:     BMI:  Body mass index is 65.24  kg/(m^2).  Estimated Nutritional Needs:   Kcal:  [id number] kcals/d (22-25 kcals IBW 59kg)  Protein:  148 g/d (2.5 g/iBW)  Fluid:  1400-173370ml/d  EDUCATION NEEDS:   No education needs identified at this time  Kamorah Nevils B. Freida BusmanAllen, RD, LDN [phone] (pager) Weekend/On-Call pager [phone])

## 2015-07-30 NOTE — Care Management (Signed)
Patient presented from home.  Mother called for a wellness check when became unable to reach patient.  She was found to be unresponsive.  Is followed by the pain clinic.  EMS gave narcan with minimal response.  She had episodes of nausea, vomiting and diarrhea several days prior.  Friends report that patient had been weak and confused prior to admission.  She is now intubated and on full ventilator support.  She is requiring emergent hemodialysis. Notified dialysis coordinator. Her CK on admission was over 15,000

## 2015-07-30 NOTE — Progress Notes (Addendum)
[facility] Progess Note    ASSESSMENT/PLAN     50 yo with PMHx of DM, Morbid obesity, C5-6 repair, Asthma, HTN, HLD, now with septic shock, acute renal failure and rhabdomyolysis. Intubated in the ER for airway protection due to AMS/lethargy/Sepsis and impending respiratory failure.   PULMONARY OETT 4/26; 7.5 ETT  VDRF Hypercarbia; ABG reviewed; 7.33/49/75/25.8, c/w mild respiratory acidosis.  Sepsis Hypovolemic shock, continue IVF, wean down pressors.  ?OSA/OHS Rhabdo ARF; on dialysis.   CXR 4/27 reviewed: improved edema, HD and ETT in place.  P:  - MV wean as tolerated - VAP protocol - WUA/SBT daily - Propofol for sedation, fentanyl for analgesia - ABG prn - CXR now in the AM - plan for crrt tonight     CARDIOVASCULAR CVL HTN Hypotension-- currently on levophed at 15 mc Shock - septic and hypovoluemic Hyperkalemia Elevated troponin P:  - maintain MAP>65 - vasopressor as needed - hold antihypertensives - hemodynamic monitoring - EKG>>sinus tach. - ECHO>> results pending.  - trend CE x 3  RENAL ARF Rhabdomyolysis Hyperkalemia Severe Metabolic Acidosis and mild respiratory acidosis Elevated Creatinine P:  - Dialysis per nephro.  - bicarb gtt - serial BMP - ABG as needed - ICU electrolyte replacment - trend CK levels   GASTROINTESTINAL Diarrhea Hx of gastric Bypass P:  - diarrhea x 2 weeks per family; will check stool panel.  - empiric Flagyl - stool culture - may consider CT A/P   HEMATOLOGIC Leukocytosis Sepsis P:  -leukemoid reaction and sepsis -treat underlying condition -source of sepsis unknown, most likely GI given history of recent diarrhea -follow CBC  INFECTIOUS Sepsis - possible GI source P:  - Continue zosyn, will discontinue vanco.  - PCT - lactic acid - follow up cultures  Micro/culture results: BCx2 4/26 pending.  UC 4/26 pending.  Staph PCR Positive.  MRSA PCR negative.     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   DVT Prophylaxis:  GI Prophylaxis:    ---------------------------------------   ----------------------------------------   Name: Tiffany Gomez MRN: [id number] DOB: June 20, 1965    ADMISSION DATE:  07/29/2015  SUBJECTIVE:   Pt currently on the ventilator, can not provide history or review of systems.    VITAL SIGNS: Temp:  [97.6 F (36.4 C)-98.8 F (37.1 C)] 98.3 F (36.8 C) (04/27 0500) Pulse Rate:  [90-115] 105 (04/27 0600) Resp:  [8-31] 17 (04/27 0600) BP: (45-174)/(23-117) 91/77 mmHg (04/27 0600) SpO2:  [94 %-100 %] 100 % (04/27 0600) FiO2 (%):  [40 %-100 %] 40 % (04/27 0736) Weight:  [389 lb 1.8 oz (176.5 kg)-404 lb (183.253 kg)] 404 lb (183.253 kg) (04/27 0554) HEMODYNAMICS:   VENTILATOR SETTINGS: Vent Mode:  [-] PRVC FiO2 (%):  [40 %-100 %] 40 % Set Rate:  [12 bmp-24 bmp] 24 bmp Vt Set:  [500 mL] 500 mL PEEP:  [5 cmH20] 5 cmH20 INTAKE / OUTPUT:  Intake/Output Summary (Last 24 hours) at 07/30/15 0754 Last data filed at 07/30/15 0600  Gross per 24 hour  Intake 1537.5 ml  Output    125 ml  Net 1412.5 ml    PHYSICAL EXAMINATION: Physical Examination:   VS: BP 91/77 mmHg  Pulse 105  Temp(Src) 98.3 F (36.8 C) (Oral)  Resp  17  Ht  (1.676 m)  Wt 404 lb (183.253 kg)  BMI 65.24 kg/m2  SpO2 100%  General Appearance: No distress  Neuro:without focal findings, mental status reduced. Pt awakens to voice and touch.  HEENT: PERRLA, EOM intact. Pulmonary: normal breath sounds   CardiovascularNormal S1,S2.  No m/r/g.   Abdomen: Benign, Soft, non-tender. Obese.  Renal:  No costovertebral tenderness  GU:  Not performed at this  time. Endocrine: No evident thyromegaly. Skin:   warm, no rashes, no ecchymosis  Extremities: normal, no cyanosis, clubbing.   LABS:   LABORATORY PANEL:   CBC  Recent Labs Lab 07/30/15 0335  WBC 20.1*  HGB 10.3*  HCT 32.6*  PLT 332    Chemistries   Recent Labs Lab 07/29/15 2244 07/30/15 0335  NA 135 137  K 4.9 3.6  CL 105 101  CO2 21* 26  GLUCOSE 265* 204*  BUN 38* 27*  CREATININE 4.66*  4.57* 3.23*  CALCIUM 6.4* 6.7*  MG 1.6* 1.5*  PHOS 6.6* 4.8*  AST 296*  --   ALT 90*  --   ALKPHOS 84  --   BILITOT 0.7  --      Recent Labs Lab 07/29/15 2240 07/29/15 2330 07/30/15 0359  GLUCAP 250* 243* 184*    Recent Labs Lab 07/29/15 2146 07/30/15 0310  PHART 7.16* 7.33*  PCO2ART 52* 49*  PO2ART 238* 75*    Recent Labs Lab 07/29/15 1713 07/29/15 2244  AST 183* 296*  ALT 73* 90*  ALKPHOS 96 84  BILITOT 0.6 0.7  ALBUMIN 3.2* 2.7*    Cardiac Enzymes  Recent Labs Lab 07/30/15 0209  TROPONINI 0.22*    RADIOLOGY:  Portable Chest Xray  07/30/2015  CLINICAL DATA:  Respiratory failure EXAM: PORTABLE CHEST 1 VIEW COMPARISON:  Yesterday FINDINGS: Endotracheal tube tip remains at the clavicular heads. An orogastric tube reaches the stomach. Right-sided dialysis catheter with tip at the upper atrium. Perihilar atelectasis, increased in the right upper lobe. Lung volumes are low. Chronic cardiomegaly. No edema, effusion, or air leak. IMPRESSION: 1. Unchanged positioning of tubes and dialysis catheter. 2. Low lung volumes with progressive atelectasis. Electronically Signed   By: Marnee Spring M.D.   On: 07/30/2015 04:50   Dg Chest Port 1 View  07/29/2015  CLINICAL DATA:  Dialysis catheter placement.  Initial encounter. EXAM: PORTABLE CHEST 1 VIEW COMPARISON:  Chest radiograph performed earlier today at 7:47 p.m. FINDINGS: The patient's endotracheal tube is seen ending 3 cm above the carina. An enteric tube is noted extending below the diaphragm. A right IJ  dual-lumen catheter is noted ending about the proximal right atrium, 2 cm below the cavoatrial junction. The lungs are hypoexpanded. No pleural effusion or pneumothorax is seen. No masses are identified. The cardiomediastinal silhouette is borderline normal in size. No acute osseous abnormalities are identified. IMPRESSION: 1. Endotracheal tube seen ending 3 cm above the carina. 2. Right IJ dual-lumen catheter noted ending about the proximal right atrium, 2 cm below the cavoatrial junction. 3. Lungs hypoexpanded but grossly clear. Electronically Signed   By: Roanna Raider M.D.   On: 07/29/2015 23:21   Dg Chest Portable 1 View  07/29/2015  CLINICAL DATA:  Status post intubation. EXAM: PORTABLE CHEST 1 VIEW COMPARISON:  Same day. FINDINGS: Stable cardiomegaly. No pneumothorax is noted. No significant pleural effusion is noted. Right basilar opacity is noted concerning for pneumonia or subsegmental atelectasis. Endotracheal tube is seen projected over the tracheal air shadow with distal tip 2  cm above the carina. Bony thorax is unremarkable. IMPRESSION: Endotracheal tube in grossly good position. Mild right basilar subsegmental atelectasis or pneumonia is noted. Electronically Signed   By: Lupita RaiderJames  Green Jr, M.D.   On: 07/29/2015 20:10   Dg Chest Portable 1 View  07/29/2015  CLINICAL DATA:  Acute onset of disorientation and lethargy. Vomiting and diarrhea. Initial encounter. EXAM: PORTABLE CHEST 1 VIEW COMPARISON:  Chest radiograph performed 05/30/2014 FINDINGS: The lungs are hypoexpanded. Mild vascular crowding and vascular congestion are seen. There is no evidence of focal opacification, pleural effusion or pneumothorax. The cardiomediastinal silhouette is borderline enlarged. No acute osseous abnormalities are seen. IMPRESSION: Mild vascular congestion and borderline cardiomegaly. Lungs remain grossly clear. Electronically Signed   By: Roanna RaiderJeffery  Chang M.D.   On: 07/29/2015 18:26   Dg Abd Portable  1v  07/29/2015  CLINICAL DATA:  Orogastric tube placement.  Initial encounter. EXAM: PORTABLE ABDOMEN - 1 VIEW COMPARISON:  CT of the abdomen and pelvis from 08/03/2011 FINDINGS: The patient's enteric tube is noted ending overlying the body of the stomach, with the side port about the gastroesophageal junction. The visualized bowel gas pattern is grossly unremarkable. No free intra-abdominal air is seen, though evaluation for free air is limited on supine views. No acute osseous abnormalities are identified. IMPRESSION: Enteric tube noted ending overlying the body of the stomach, with the side port about the gastroesophageal junction. Electronically Signed   By: Roanna RaiderJeffery  Chang M.D.   On: 07/29/2015 23:22       --Wells Guileseep Christa Fasig, MD.  ICU Pager: [phone] [facility] Office Number: [phone]  Santiago Gladavid Kasa, M.D.  Stephanie AcreVishal Mungal, M.D.  Billy Fischeravid Simonds, M.D  07/30/2015 Critical Care Attestation.  I have personally obtained a history, examined the patient, evaluated laboratory and imaging results, formulated the assessment and plan and placed orders. The Patient requires high complexity decision making for assessment and support, frequent evaluation and titration of therapies, application of advanced monitoring technologies and extensive interpretation of multiple databases. The patient has critical illness that could lead imminently to failure of 1 or more organ systems and requires the highest level of physician preparedness to intervene.  Critical Care Time devoted to patient care services described in this note is 35 minutes and is exclusive of time spent in procedures.

## 2015-07-30 NOTE — Consult Note (Addendum)
Pharmacy Antibiotic Note  Tiffany Gomez is a 50 y.o. female admitted on 07/29/2015 with sepsis and ARF.  Pharmacy has been consulted for vancomycin, metronidazole, zosyn dosing. Suspected source is GI.   Plan: After discussion with Dr. Nicholos Johnsamachandran, will d/c vancomycin and Flagyl.   Will change Zosyn dosing to 3.375 g EI q 12 hours.   Height: 5\' 6"  (167.6 cm) Weight: (!) 404 lb (183.253 kg) IBW/kg (Calculated) : 59.3  Temp (24hrs), Avg:98.3 F (36.8 C), Min:97.6 F (36.4 C), Max:98.8 F (37.1 C)   Recent Labs Lab 07/29/15 1713 07/29/15 2244 07/30/15 0335  WBC 30.2* 22.4* 20.1*  CREATININE 5.83* 4.66*  4.57* 3.23*  LATICACIDVEN  --  1.8  --     Estimated Creatinine Clearance: 37 mL/min (by C-G formula based on Cr of 3.23).    No Known Allergies  Antimicrobials this admission: 4/26 vancomycin >> 4/27 4/26 zosyn >>  4/26 metronidazole >> 4/27  Dose adjustments this admission:   Microbiology results: 4/26 BCx: pending 4/26 UCx: NGTD 4/26 MRSA PCR: negative   Thank you for allowing pharmacy to be a part of this patient's care.  Luisa HartScott Yiselle Babich, PharmD Clinical Pharmacist    07/30/2015 1:57 PM

## 2015-07-30 NOTE — Progress Notes (Signed)
Post hd tx 

## 2015-07-30 NOTE — Progress Notes (Signed)
Subjective:   Patient remains critically ill Ventilator dependent at present Hypotensive , requiring high dose levophed CK level is worsening Sedated with propofol  Objective:  Vital signs in last 24 hours:  Temp:  [97.6 F (36.4 C)-98.8 F (37.1 C)] 98.3 F (36.8 C) (04/27 0800) Pulse Rate:  [90-115] 103 (04/27 0800) Resp:  [8-31] 24 (04/27 0800) BP: (45-174)/(23-117) 75/51 mmHg (04/27 0800) SpO2:  [94 %-100 %] 99 % (04/27 0800) FiO2 (%):  [40 %-100 %] 40 % (04/27 0800) Weight:  [176.5 kg (389 lb 1.8 oz)-183.253 kg (404 lb)] 183.253 kg (404 lb) (04/27 0554)  Weight change:  Filed Weights   07/29/15 2344 07/30/15 0104 07/30/15 0554  Weight: 176.5 kg (389 lb 1.8 oz) 176.5 kg (389 lb 1.8 oz) 183.253 kg (404 lb)    Intake/Output:    Intake/Output Summary (Last 24 hours) at 07/30/15 0930 Last data filed at 07/30/15 3818  Gross per 24 hour  Intake 2591.9 ml  Output    155 ml  Net 2436.9 ml     Physical Exam: General:  critically ill appearing   HEENT  ET tube in place   Neck  right IJ dialysis catheter   Pulm/lungs  coarse breath sounds, ventilator dependent   CVS/Heart  regular, tachycardic   Abdomen:  Soft, NT, non distended, obese  Extremities: + dependent edema  Neurologic: sedated  Skin: No acute rashesq  Access: Rt IJ vascath (4/26) Dr Stevenson Clinch       Basic Metabolic Panel:   Recent Labs Lab 07/29/15 1713 07/29/15 2244 07/30/15 0335  NA 132* 135 137  K 6.9* 4.9 3.6  CL 98* 105 101  CO2 16* 21* 26  GLUCOSE 238* 265* 204*  BUN 36* 38* 27*  CREATININE 5.83* 4.66*  4.57* 3.23*  CALCIUM 7.8* 6.4* 6.7*  MG  --  1.6* 1.5*  PHOS  --  6.6* 4.8*     CBC:  Recent Labs Lab 07/29/15 1713 07/29/15 2244 07/30/15 0335  WBC 30.2* 22.4* 20.1*  HGB 11.7* 10.4* 10.3*  HCT 37.9 33.0* 32.6*  MCV 88.3 86.6 85.0  PLT 396 376 332      Microbiology:  Recent Results (from the past 720 hour(s))  MRSA PCR Screening     Status: None   Collection Time:  07/29/15 10:42 PM  Result Value Ref Range Status   MRSA by PCR NEGATIVE NEGATIVE Final    Comment:        The GeneXpert MRSA Assay (FDA approved for NASAL specimens only), is one component of a comprehensive MRSA colonization surveillance program. It is not intended to diagnose MRSA infection nor to guide or monitor treatment for MRSA infections.     Coagulation Studies: No results for input(s): LABPROT, INR in the last 72 hours.  Urinalysis:  Recent Labs  07/29/15 1713 07/30/15 0208  COLORURINE AMBER* AMBER*  LABSPEC 1.020 1.016  PHURINE 5.0 5.0  GLUCOSEU NEGATIVE NEGATIVE  HGBUR NEGATIVE 3+*  BILIRUBINUR NEGATIVE NEGATIVE  KETONESUR NEGATIVE NEGATIVE  PROTEINUR 30* 100*  NITRITE NEGATIVE NEGATIVE  LEUKOCYTESUR NEGATIVE NEGATIVE      Imaging: Portable Chest Xray  07/30/2015  CLINICAL DATA:  Respiratory failure EXAM: PORTABLE CHEST 1 VIEW COMPARISON:  Yesterday FINDINGS: Endotracheal tube tip remains at the clavicular heads. An orogastric tube reaches the stomach. Right-sided dialysis catheter with tip at the upper atrium. Perihilar atelectasis, increased in the right upper lobe. Lung volumes are low. Chronic cardiomegaly. No edema, effusion, or air leak. IMPRESSION: 1. Unchanged positioning of  tubes and dialysis catheter. 2. Low lung volumes with progressive atelectasis. Electronically Signed   By: Monte Fantasia M.D.   On: 07/30/2015 04:50   Dg Chest Port 1 View  07/29/2015  CLINICAL DATA:  Dialysis catheter placement.  Initial encounter. EXAM: PORTABLE CHEST 1 VIEW COMPARISON:  Chest radiograph performed earlier today at 7:47 p.m. FINDINGS: The patient's endotracheal tube is seen ending 3 cm above the carina. An enteric tube is noted extending below the diaphragm. A right IJ dual-lumen catheter is noted ending about the proximal right atrium, 2 cm below the cavoatrial junction. The lungs are hypoexpanded. No pleural effusion or pneumothorax is seen. No masses are  identified. The cardiomediastinal silhouette is borderline normal in size. No acute osseous abnormalities are identified. IMPRESSION: 1. Endotracheal tube seen ending 3 cm above the carina. 2. Right IJ dual-lumen catheter noted ending about the proximal right atrium, 2 cm below the cavoatrial junction. 3. Lungs hypoexpanded but grossly clear. Electronically Signed   By: Garald Balding M.D.   On: 07/29/2015 23:21   Dg Chest Portable 1 View  07/29/2015  CLINICAL DATA:  Status post intubation. EXAM: PORTABLE CHEST 1 VIEW COMPARISON:  Same day. FINDINGS: Stable cardiomegaly. No pneumothorax is noted. No significant pleural effusion is noted. Right basilar opacity is noted concerning for pneumonia or subsegmental atelectasis. Endotracheal tube is seen projected over the tracheal air shadow with distal tip 2 cm above the carina. Bony thorax is unremarkable. IMPRESSION: Endotracheal tube in grossly good position. Mild right basilar subsegmental atelectasis or pneumonia is noted. Electronically Signed   By: Marijo Conception, M.D.   On: 07/29/2015 20:10   Dg Chest Portable 1 View  07/29/2015  CLINICAL DATA:  Acute onset of disorientation and lethargy. Vomiting and diarrhea. Initial encounter. EXAM: PORTABLE CHEST 1 VIEW COMPARISON:  Chest radiograph performed 05/30/2014 FINDINGS: The lungs are hypoexpanded. Mild vascular crowding and vascular congestion are seen. There is no evidence of focal opacification, pleural effusion or pneumothorax. The cardiomediastinal silhouette is borderline enlarged. No acute osseous abnormalities are seen. IMPRESSION: Mild vascular congestion and borderline cardiomegaly. Lungs remain grossly clear. Electronically Signed   By: Garald Balding M.D.   On: 07/29/2015 18:26   Dg Abd Portable 1v  07/29/2015  CLINICAL DATA:  Orogastric tube placement.  Initial encounter. EXAM: PORTABLE ABDOMEN - 1 VIEW COMPARISON:  CT of the abdomen and pelvis from 08/03/2011 FINDINGS: The patient's enteric  tube is noted ending overlying the body of the stomach, with the side port about the gastroesophageal junction. The visualized bowel gas pattern is grossly unremarkable. No free intra-abdominal air is seen, though evaluation for free air is limited on supine views. No acute osseous abnormalities are identified. IMPRESSION: Enteric tube noted ending overlying the body of the stomach, with the side port about the gastroesophageal junction. Electronically Signed   By: Garald Balding M.D.   On: 07/29/2015 23:22     Medications:   . norepinephrine 40 mcg/min (07/30/15 0924)  . propofol (DIPRIVAN) infusion Stopped (07/30/15 0924)  . sterile water 1,000 mL with sodium acetate 150 mEq infusion 150 mL/hr at 07/30/15 0838   . antiseptic oral rinse  7 mL Mouth Rinse QID  . antiseptic oral rinse  7 mL Mouth Rinse QID  . chlorhexidine gluconate (SAGE KIT)  15 mL Mouth Rinse BID  . chlorhexidine gluconate (SAGE KIT)  15 mL Mouth Rinse BID  . famotidine  20 mg Intravenous BID  . insulin aspart  2-6 Units Subcutaneous Q4H  .  ipratropium-albuterol  3 mL Nebulization Q6H  . metronidazole  500 mg Intravenous Q8H  . piperacillin-tazobactam (ZOSYN)  IV  4.5 g Intravenous Q12H  . sodium chloride  1,000 mL Intravenous Once  . sodium chloride flush  10-40 mL Intracatheter Q12H   sodium chloride, acetaminophen, fentaNYL (SUBLIMAZE) injection, fentaNYL (SUBLIMAZE) injection, ondansetron (ZOFRAN) IV  Assessment/ Plan:  50 y.o. female with a PMHX of morbid obesity, DM, OSA, Depression, HTN, HLD, Gastric Bypass, Cervical disc fusion, chronic pain (lumbar radiculopathy), was admitted on 07/29/2015 after being found unresponsive at home  1. ARF - oliguric. Likely ATN, Patient is hypotensive. Other contributing factors include metformin, ACE-i, Ibuprofen, Rhabdomyolysis 2. Severe Hyperkerkalemia due to Rhabdomyolysis (CK > 9000->16000) 3. Acute respiratory failure- intubated in ER 4/26 4. Severe Acidosis, Ph  7.10->7.33 5. Altered mental status 6. Morbid obesity  Plan: Due to oliguric ARF and severe hyperkalemia, emergent HD was done yesterday which has resulted in correction of potassium and acidosis.   - plan for another short treatment today since CK is high - may d/c Bicarb drip -  HD today (2.5 h, 3 K, 250/500) - will follow     LOS: 1 Tiffany Gomez 4/27/20179:30 AM

## 2015-07-30 NOTE — Progress Notes (Signed)
Post Hd   

## 2015-07-30 NOTE — Progress Notes (Signed)
system clotted, tx restarted

## 2015-07-30 NOTE — Progress Notes (Signed)
Inpatient Diabetes Program Recommendations  AACE/ADA: New Consensus Statement on Inpatient Glycemic Control (2015)  Target Ranges:  Prepandial:   less than 140 mg/dL      Peak postprandial:   less than 180 mg/dL (1-2 hours)      Critically ill patients:  140 - 180 mg/dL   Review of Glycemic Control  Results for Tiffany Gomez, Tiffany Gomez (MRN [id number]) as of 07/30/2015 08:03  Ref. Range 07/29/2015 22:40 07/29/2015 23:30 07/30/2015 03:59  Glucose-Capillary Latest Ref Range: 65-99 mg/dL 308250 (H) 657243 (H) 846184 (H)    Diabetes history: Type 2 Outpatient Diabetes medications: Glipizide 5mg /day, Metformin 500mg  bid Current orders for Inpatient glycemic control: Novolog 0-6 units q4h  Inpatient Diabetes Program Recommendations:   Consider ordering an A1C to determine blood sugar control over the past 3 months.   Susette RacerJulie Nalah Macioce, RN, BA, MHA, CDE Diabetes Coordinator Inpatient Diabetes Program  [phone] (Team Pager) [phone] [facility] Office) 07/30/2015 8:08 AM

## 2015-07-30 NOTE — Progress Notes (Signed)
Pre-hd tx 

## 2015-07-30 NOTE — Progress Notes (Signed)
TX started 

## 2015-07-30 NOTE — Progress Notes (Signed)
Chaplain rounded the unit and provided a compassionate presence and support to the family while the patient appeared to be sleeping.  Jefm PettyChaplain Ryle Buscemi [phone]

## 2015-07-30 NOTE — Progress Notes (Signed)
Tx endede

## 2015-07-30 NOTE — Progress Notes (Signed)
HD tx start 

## 2015-07-30 NOTE — Progress Notes (Signed)
PRE HD   

## 2015-07-30 NOTE — Progress Notes (Signed)
Hemodialysis completed. 

## 2015-07-30 NOTE — Progress Notes (Signed)
Pharmacy Antibiotic Follow-up Note  Tiffany Gomez is a 50 y.o. year-old female admitted on 07/29/2015.  The patient is currently on day 2 of Zosyn for Sepsis.  Assessment/Plan: This patient's current antibiotics will be continued without adjustments.  MD suspects results may be due to contaminant.   Temp (24hrs), Avg:98.3 F (36.8 C), Min:97.7 F (36.5 C), Max:98.8 F (37.1 C)   Recent Labs Lab 07/29/15 1713 07/29/15 2244 07/30/15 0335  WBC 30.2* 22.4* 20.1*    Recent Labs Lab 07/29/15 1713 07/29/15 2244 07/30/15 0335  CREATININE 5.83* 4.66*  4.57* 3.23*   Estimated Creatinine Clearance: 37 mL/min (by C-G formula based on Cr of 3.23).    No Known Allergies  Antimicrobials this admission:   >> Zosyn 3.375 gm IV Q12H    >>   Levels/dose changes this admission: Vanc started on 4/26 but d/c'd on 4/27.   Microbiology results:  BCx: 4/27 Staph species growing in 1 Anaerobic bottle, Mech A not detected   UCx:    Sputum:    MRSA PCR:   Thank you for allowing pharmacy to be a part of this patient's care.  Tiffany Gomez D PharmD 07/30/2015 9:23 PM

## 2015-07-31 ENCOUNTER — Inpatient Hospital Stay (HOSPITAL_COMMUNITY)
Admit: 2015-07-31 | Discharge: 2015-07-31 | Disposition: A | Discharge status: Home / Self Care | Payer: Medicare Other | Attending: Internal Medicine | Admitting: Internal Medicine

## 2015-07-31 ENCOUNTER — Inpatient Hospital Stay: Payer: Medicare Other

## 2015-07-31 DIAGNOSIS — R0600 Dyspnea, unspecified: Secondary | ICD-10-CM

## 2015-07-31 LAB — COMPREHENSIVE METABOLIC PANEL
ALBUMIN: 2.1 g/dL — AB (ref 3.5–5.0)
ALK PHOS: 75 U/L (ref 38–126)
ALT: 76 U/L — ABNORMAL HIGH (ref 14–54)
ANION GAP: 13 (ref 5–15)
AST: 236 U/L — AB (ref 15–41)
BILIRUBIN TOTAL: 1 mg/dL (ref 0.3–1.2)
BUN: 28 mg/dL — AB (ref 6–20)
CALCIUM: 7 mg/dL — AB (ref 8.9–10.3)
CO2: 30 mmol/L (ref 22–32)
Chloride: 95 mmol/L — ABNORMAL LOW (ref 101–111)
Creatinine, Ser: 3.76 mg/dL — ABNORMAL HIGH (ref 0.44–1.00)
GFR calc Af Amer: 15 mL/min — ABNORMAL LOW (ref >60)
GFR, EST NON AFRICAN AMERICAN: 13 mL/min — AB (ref >60)
GLUCOSE: 181 mg/dL — AB (ref 65–99)
POTASSIUM: 3.4 mmol/L — AB (ref 3.5–5.1)
Sodium: 138 mmol/L (ref 135–145)
TOTAL PROTEIN: 4.9 g/dL — AB (ref 6.5–8.1)

## 2015-07-31 LAB — GLUCOSE, CAPILLARY
GLUCOSE-CAPILLARY: 150 mg/dL — AB (ref 65–99)
GLUCOSE-CAPILLARY: 124 mg/dL — AB (ref 65–99)
GLUCOSE-CAPILLARY: 128 mg/dL — AB (ref 65–99)
GLUCOSE-CAPILLARY: 121 mg/dL — AB (ref 65–99)
Glucose-Capillary: 171 mg/dL — ABNORMAL HIGH (ref 65–99)
Glucose-Capillary: 175 mg/dL — ABNORMAL HIGH (ref 65–99)

## 2015-07-31 LAB — CBC
HEMATOCRIT: 24.7 % — AB (ref 35.0–47.0)
HEMOGLOBIN: 8 g/dL — AB (ref 12.0–16.0)
MCH: 27.4 pg (ref 26.0–34.0)
MCHC: 32.4 g/dL (ref 32.0–36.0)
MCV: 84.5 fL (ref 80.0–100.0)
Platelets: 255 10*3/uL (ref 150–440)
RBC: 2.92 MIL/uL — ABNORMAL LOW (ref 3.80–5.20)
RDW: 14.9 % — AB (ref 11.5–14.5)
WBC: 15.6 10*3/uL — ABNORMAL HIGH (ref 3.6–11.0)

## 2015-07-31 LAB — URINE CULTURE: CULTURE: NO GROWTH

## 2015-07-31 LAB — HEPATITIS B SURFACE ANTIGEN: Hepatitis B Surface Ag: NEGATIVE

## 2015-07-31 LAB — ECHOCARDIOGRAM COMPLETE
Height: 66 in
WEIGHTICAEL: 6464 [oz_av]

## 2015-07-31 LAB — HEPATITIS B SURFACE ANTIBODY, QUANTITATIVE: Hepatitis B-Post: <3.1 m[IU]/mL — ABNORMAL LOW (ref >9.9)

## 2015-07-31 LAB — PROCALCITONIN: PROCALCITONIN: 3.29 ng/mL

## 2015-07-31 MED FILL — Medication: Qty: 1 | Status: AC

## 2015-07-31 NOTE — Progress Notes (Signed)
*  PRELIMINARY RESULTS* °Echocardiogram °2D Echocardiogram has been performed. ° °Tiffany Gomez °07/31/2015, 11:20 AM °

## 2015-07-31 NOTE — Progress Notes (Signed)
Subjective:   Patient remains critically ill Ventilator dependent at present Off pressors Sedation turned off this AM, follows simple commands UOP remains low but appears to be improving      Objective:  Vital signs in last 24 hours:  Temp:  [97.7 F (36.5 C)-98.4 F (36.9 C)] 98.4 F (36.9 C) (04/28 0730) Pulse Rate:  [87-122] 102 (04/28 0800) Resp:  [22-26] 24 (04/28 0800) BP: (80-139)/(33-98) 116/58 mmHg (04/28 0800) SpO2:  [94 %-100 %] 99 % (04/28 0800) FiO2 (%):  [35 %] 35 % (04/28 0800) Weight:  [183.253 kg (404 lb)] 183.253 kg (404 lb) (04/28 0500)  Weight change: 6.668 kg (14 lb 11.2 oz) Filed Weights   07/30/15 0104 07/30/15 0554 07/31/15 0500  Weight: 176.5 kg (389 lb 1.8 oz) 183.253 kg (404 lb) 183.253 kg (404 lb)    Intake/Output:    Intake/Output Summary (Last 24 hours) at 07/31/15 4970 Last data filed at 07/31/15 2637  Gross per 24 hour  Intake 5900.88 ml  Output     95 ml  Net 5805.88 ml     Physical Exam: General:  critically ill appearing   HEENT  ET tube in place   Neck  right IJ dialysis catheter   Pulm/lungs  coarse breath sounds, ventilator dependent   CVS/Heart  regular, tachycardic   Abdomen:  Soft, NT, non distended, obese  Extremities: + dependent edema  Neurologic: sedated  Skin: No acute rashesq  Access: Rt IJ vascath (4/26) Dr Stevenson Clinch       Basic Metabolic Panel:   Recent Labs Lab 07/29/15 1713 07/29/15 2244 07/30/15 0335 07/31/15 0500  NA 132* 135 137 138  K 6.9* 4.9 3.6 3.4*  CL 98* 105 101 95*  CO2 16* 21* 26 30  GLUCOSE 238* 265* 204* 181*  BUN 36* 38* 27* 28*  CREATININE 5.83* 4.66*  4.57* 3.23* 3.76*  CALCIUM 7.8* 6.4* 6.7* 7.0*  MG  --  1.6* 1.5*  --   PHOS  --  6.6* 4.8*  --      CBC:  Recent Labs Lab 07/29/15 1713 07/29/15 2244 07/30/15 0335 07/31/15 0500  WBC 30.2* 22.4* 20.1* 15.6*  HGB 11.7* 10.4* 10.3* 8.0*  HCT 37.9 33.0* 32.6* 24.7*  MCV 88.3 86.6 85.0 84.5  PLT 396 376 332 255       Microbiology:  Recent Results (from the past 720 hour(s))  Culture, blood (routine x 2)     Status: None (Preliminary result)   Collection Time: 07/29/15  5:13 PM  Result Value Ref Range Status   Specimen Description BLOOD RIGHT FATTY CASTS  Final   Special Requests BOTTLES DRAWN AEROBIC AND ANAEROBIC  1CC  Final   Culture  Setup Time   Final    GRAM POSITIVE COCCI ANAEROBIC BOTTLE ONLY CRITICAL RESULT CALLED TO, READ BACK BY AND VERIFIED WITH: JASON ROBBINS AT 2110 07/30/15 KLK.    Culture   Final    COAGULASE NEGATIVE STAPHYLOCOCCUS ANAEROBIC BOTTLE ONLY THE SIGNIFICANCE OF ISOLATING THIS ORGANISM FROM A SINGLE VENIPUNCTURE CANNOT BE PREDICTED WITHOUT FURTHER CLINICAL AND CULTURE CORRELATION. SUSCEPTIBILITIES AVAILABLE ONLY ON REQUEST.    Report Status PENDING  Incomplete  Urine culture     Status: None (Preliminary result)   Collection Time: 07/29/15  5:13 PM  Result Value Ref Range Status   Specimen Description URINE, CATHETERIZED  Final   Special Requests NONE  Final   Culture NO GROWTH < 24 HOURS  Final   Report Status PENDING  Incomplete  Blood Culture ID Panel (Reflexed)     Status: Abnormal   Collection Time: 07/29/15  5:13 PM  Result Value Ref Range Status   Enterococcus species NOT DETECTED NOT DETECTED Final   Vancomycin resistance NOT DETECTED NOT DETECTED Final   Listeria monocytogenes NOT DETECTED NOT DETECTED Final   Staphylococcus species DETECTED (A) NOT DETECTED Final    Comment: CRITICAL RESULT CALLED TO, READ BACK BY AND VERIFIED WITH: JASON ROBBINS AT 2110 07/30/15 KLK.    Staphylococcus aureus NOT DETECTED NOT DETECTED Final   Methicillin resistance NOT DETECTED NOT DETECTED Final   Streptococcus species NOT DETECTED NOT DETECTED Final   Streptococcus agalactiae NOT DETECTED NOT DETECTED Final   Streptococcus pneumoniae NOT DETECTED NOT DETECTED Final   Streptococcus pyogenes NOT DETECTED NOT DETECTED Final   Acinetobacter baumannii NOT  DETECTED NOT DETECTED Final   Enterobacteriaceae species NOT DETECTED NOT DETECTED Final   Enterobacter cloacae complex NOT DETECTED NOT DETECTED Final   Escherichia coli NOT DETECTED NOT DETECTED Final   Klebsiella oxytoca NOT DETECTED NOT DETECTED Final   Klebsiella pneumoniae NOT DETECTED NOT DETECTED Final   Proteus species NOT DETECTED NOT DETECTED Final   Serratia marcescens NOT DETECTED NOT DETECTED Final   Carbapenem resistance NOT DETECTED NOT DETECTED Final   Haemophilus influenzae NOT DETECTED NOT DETECTED Final   Neisseria meningitidis NOT DETECTED NOT DETECTED Final   Pseudomonas aeruginosa NOT DETECTED NOT DETECTED Final   Candida albicans NOT DETECTED NOT DETECTED Final   Candida glabrata NOT DETECTED NOT DETECTED Final   Candida krusei NOT DETECTED NOT DETECTED Final   Candida parapsilosis NOT DETECTED NOT DETECTED Final   Candida tropicalis NOT DETECTED NOT DETECTED Final  Culture, blood (routine x 2)     Status: None (Preliminary result)   Collection Time: 07/29/15  5:20 PM  Result Value Ref Range Status   Specimen Description BLOOD LEFT ASSIST CONTROL  Final   Special Requests BOTTLES DRAWN AEROBIC AND ANAEROBIC 1CC  Final   Culture NO GROWTH < 24 HOURS  Final   Report Status PENDING  Incomplete  MRSA PCR Screening     Status: None   Collection Time: 07/29/15 10:42 PM  Result Value Ref Range Status   MRSA by PCR NEGATIVE NEGATIVE Final    Comment:        The GeneXpert MRSA Assay (FDA approved for NASAL specimens only), is one component of a comprehensive MRSA colonization surveillance program. It is not intended to diagnose MRSA infection nor to guide or monitor treatment for MRSA infections.     Coagulation Studies: No results for input(s): LABPROT, INR in the last 72 hours.  Urinalysis:  Recent Labs  07/29/15 1713 07/30/15 0208  COLORURINE AMBER* AMBER*  LABSPEC 1.020 1.016  PHURINE 5.0 5.0  GLUCOSEU NEGATIVE NEGATIVE  HGBUR NEGATIVE 3+*   BILIRUBINUR NEGATIVE NEGATIVE  KETONESUR NEGATIVE NEGATIVE  PROTEINUR 30* 100*  NITRITE NEGATIVE NEGATIVE  LEUKOCYTESUR NEGATIVE NEGATIVE      Imaging: Dg Chest 1 View  07/31/2015  CLINICAL DATA:  Shortness of breath. EXAM: CHEST 1 VIEW COMPARISON:  07/30/2015. FINDINGS: Endotracheal tube and orogastric tube in stable position. Right-sided dialysis catheter again noted with tip in right atrium. Stable cardiomegaly. Persistent low lung volumes with persistent basilar atelectasis. Persistent atelectatic changes right upper lobe. No prominent pleural effusion. No pneumothorax. Prior cervical spine fusion. IMPRESSION: 1. Endotracheal tube and orogastric tube in stable position. Right IJ dialysis catheter stable position, its tips in the right atrium. 2. Progressive  bibasilar atelectasis. Persistent right upper lobe atelectasis. 3. Stable cardiomegaly. Electronically Signed   By: Marcello Moores  Register   On: 07/31/2015 07:11   Ct Head Wo Contrast  07/30/2015  CLINICAL DATA:  Altered mental status, lethargy, sepsis. Septic shock. Rhabdomyolysis. Status post fall. EXAM: CT HEAD WITHOUT CONTRAST TECHNIQUE: Contiguous axial images were obtained from the base of the skull through the vertex without intravenous contrast. COMPARISON:  None. FINDINGS: No evidence of parenchymal hemorrhage or extra-axial fluid collection. No mass lesion, mass effect, or midline shift. No CT evidence of acute infarction. Cerebral volume is within normal limits.  No ventriculomegaly. The visualized paranasal sinuses are essentially clear. The mastoid air cells are unopacified. No evidence of calvarial fracture. IMPRESSION: No evidence of acute intracranial abnormality. Electronically Signed   By: Julian Hy M.D.   On: 07/30/2015 14:45   Portable Chest Xray  07/30/2015  CLINICAL DATA:  Respiratory failure EXAM: PORTABLE CHEST 1 VIEW COMPARISON:  Yesterday FINDINGS: Endotracheal tube tip remains at the clavicular heads. An  orogastric tube reaches the stomach. Right-sided dialysis catheter with tip at the upper atrium. Perihilar atelectasis, increased in the right upper lobe. Lung volumes are low. Chronic cardiomegaly. No edema, effusion, or air leak. IMPRESSION: 1. Unchanged positioning of tubes and dialysis catheter. 2. Low lung volumes with progressive atelectasis. Electronically Signed   By: Monte Fantasia M.D.   On: 07/30/2015 04:50   Dg Chest Port 1 View  07/29/2015  CLINICAL DATA:  Dialysis catheter placement.  Initial encounter. EXAM: PORTABLE CHEST 1 VIEW COMPARISON:  Chest radiograph performed earlier today at 7:47 p.m. FINDINGS: The patient's endotracheal tube is seen ending 3 cm above the carina. An enteric tube is noted extending below the diaphragm. A right IJ dual-lumen catheter is noted ending about the proximal right atrium, 2 cm below the cavoatrial junction. The lungs are hypoexpanded. No pleural effusion or pneumothorax is seen. No masses are identified. The cardiomediastinal silhouette is borderline normal in size. No acute osseous abnormalities are identified. IMPRESSION: 1. Endotracheal tube seen ending 3 cm above the carina. 2. Right IJ dual-lumen catheter noted ending about the proximal right atrium, 2 cm below the cavoatrial junction. 3. Lungs hypoexpanded but grossly clear. Electronically Signed   By: Garald Balding M.D.   On: 07/29/2015 23:21   Dg Chest Portable 1 View  07/29/2015  CLINICAL DATA:  Status post intubation. EXAM: PORTABLE CHEST 1 VIEW COMPARISON:  Same day. FINDINGS: Stable cardiomegaly. No pneumothorax is noted. No significant pleural effusion is noted. Right basilar opacity is noted concerning for pneumonia or subsegmental atelectasis. Endotracheal tube is seen projected over the tracheal air shadow with distal tip 2 cm above the carina. Bony thorax is unremarkable. IMPRESSION: Endotracheal tube in grossly good position. Mild right basilar subsegmental atelectasis or pneumonia is  noted. Electronically Signed   By: Marijo Conception, M.D.   On: 07/29/2015 20:10   Dg Chest Portable 1 View  07/29/2015  CLINICAL DATA:  Acute onset of disorientation and lethargy. Vomiting and diarrhea. Initial encounter. EXAM: PORTABLE CHEST 1 VIEW COMPARISON:  Chest radiograph performed 05/30/2014 FINDINGS: The lungs are hypoexpanded. Mild vascular crowding and vascular congestion are seen. There is no evidence of focal opacification, pleural effusion or pneumothorax. The cardiomediastinal silhouette is borderline enlarged. No acute osseous abnormalities are seen. IMPRESSION: Mild vascular congestion and borderline cardiomegaly. Lungs remain grossly clear. Electronically Signed   By: Garald Balding M.D.   On: 07/29/2015 18:26   Dg Abd Portable 1v  07/29/2015  CLINICAL DATA:  Orogastric tube placement.  Initial encounter. EXAM: PORTABLE ABDOMEN - 1 VIEW COMPARISON:  CT of the abdomen and pelvis from 08/03/2011 FINDINGS: The patient's enteric tube is noted ending overlying the body of the stomach, with the side port about the gastroesophageal junction. The visualized bowel gas pattern is grossly unremarkable. No free intra-abdominal air is seen, though evaluation for free air is limited on supine views. No acute osseous abnormalities are identified. IMPRESSION: Enteric tube noted ending overlying the body of the stomach, with the side port about the gastroesophageal junction. Electronically Signed   By: Garald Balding M.D.   On: 07/29/2015 23:22     Medications:   . norepinephrine Stopped (07/31/15 0837)  . propofol (DIPRIVAN) infusion Stopped (07/31/15 0741)  . sterile water 1,000 mL with sodium acetate 150 mEq infusion 150 mL/hr at 07/31/15 0730   . antiseptic oral rinse  7 mL Mouth Rinse QID  . antiseptic oral rinse  7 mL Mouth Rinse QID  . chlorhexidine gluconate (SAGE KIT)  15 mL Mouth Rinse BID  . chlorhexidine gluconate (SAGE KIT)  15 mL Mouth Rinse BID  . famotidine  20 mg Intravenous  Q24H  . feeding supplement (PRO-STAT SUGAR FREE 64)  60 mL Oral TID  . feeding supplement (VITAL HIGH PROTEIN)  1,000 mL Per Tube Q24H  . heparin subcutaneous  5,000 Units Subcutaneous Q8H  . insulin aspart  2-6 Units Subcutaneous Q4H  . ipratropium-albuterol  3 mL Nebulization Q6H  . piperacillin-tazobactam (ZOSYN)  IV  3.375 g Intravenous Q12H  . sodium chloride flush  10-40 mL Intracatheter Q12H   sodium chloride, acetaminophen, fentaNYL (SUBLIMAZE) injection, fentaNYL (SUBLIMAZE) injection, ondansetron (ZOFRAN) IV  Assessment/ Plan:  50 y.o. female with a PMHX of morbid obesity, DM, OSA, Depression, HTN, HLD, Gastric Bypass, Cervical disc fusion, chronic pain (lumbar radiculopathy), was admitted on 07/29/2015 after being found unresponsive at home  1. ARF - oliguric. Likely ATN, Patient was hypotensive. Other contributing factors include metformin, ACE-i, Ibuprofen, Rhabdomyolysis 2. Severe Hyperkerkalemia due to Rhabdomyolysis (CK > 9000->16000) 3. Acute respiratory failure- intubated in ER 4/26 4. Severe Acidosis, Ph 7.10->7.33 5. Altered mental status 6. Morbid obesity  Plan: Due to oliguric ARF and severe hyperkalemia, emergent HD was done 4/26, 4/27 which has resulted in correction of potassium and acidosis.  Electrolytes and Volume status are acceptable No acute indication for Dialysis at present  Monitor UOP and electrolytes closely    LOS: 2 Tiffany Gomez 4/28/20179:03 AM

## 2015-07-31 NOTE — Progress Notes (Signed)
Nutrition Follow-up  DOCUMENTATION CODES:   Morbid obesity  INTERVENTION:  -Await diet progression post extubation  NUTRITION DIAGNOSIS:   Inadequate oral intake related to acute illness as evidenced by NPO status. Continues, diet progression post extubation  GOAL:   Patient will meet greater than or equal to 90% of their needs   MONITOR:   Diet advancement, Labs, Weight trends, Skin  REASON FOR ASSESSMENT:   Ventilator    ASSESSMENT:   50 y/o female admitted with AMS, acute rhabdomylysis, diarrhea for the past 2 weeks.  Pt with acute respiratory failure intubated on 4/26 for airway protection, severe acidosis, emergent HD, sepsis.  Pt s/p extubation this AM, TF held with extubation  Diet Order:  Diet NPO time specified  Skin:  Reviewed, no issues  Last BM:  none documented ; [facility] diarrhea [facility]  Labs: potassium 3.4, Creatinine 3.76  Glucose Profile:   Recent Labs  07/31/15 0051 07/31/15 0415 07/31/15 0740  GLUCAP 175* 171* 150*   Meds: ss novolog  Height:   Ht Readings from Last 1 Encounters:  07/30/15 5\' 6"  (1.676 m)    Weight: Asher MuirJamie RN reports that family indicating that pt lost a lot of weight post bariatric surgery but has gained most of it back (reports pt was around 460 pounds prior to surgery)  Wt Readings from Last 1 Encounters:  07/31/15 404 lb (183.253 kg)    Ideal Body Weight:  59.1 kg  BMI:  Body mass index is 65.24 kg/(m^2).  Estimated Nutritional Needs:   Kcal:  1600-2000 kcals  Protein:  89-118 g  Fluid:  >2 L per day  EDUCATION NEEDS:   No education needs identified at this time   Romelle StarcherCate Jacque Garrels MS, RD, LDN [phone] Pager  [phone] Weekend/On-Call Pager

## 2015-07-31 NOTE — Evaluation (Signed)
Clinical/Bedside Swallow Evaluation Patient Details  Name: Tiffany Gomez MRN: [id number] Date of Birth: October 15, 1965  Today's Date: 07/31/2015 Time: SLP Start Time (ACUTE ONLY): 1215 SLP Stop Time (ACUTE ONLY): 1315 SLP Time Calculation (min) (ACUTE ONLY): 60 min  Past Medical History:  Past Medical History  Diagnosis Date  . Diabetes (HCC)   . Sleep apnea   . Leg pain   . Asthma   . Depression   . Morbid obesity (HCC)   . Essential hypertension   . Hyperlipidemia   . Anxiety     Panic attacks   Past Surgical History:  Past Surgical History  Procedure Laterality Date  . Gastric bypass    . Laparoscopic cholecystectomy    . Cardiac catheterization    . Anterior cervical decomp/discectomy fusion N/A 06/16/2014    Procedure: ANTERIOR CERVICAL DECOMPRESSION/DISCECTOMY FUSION CERVICAL FIVE-SIX;  Surgeon: Julio Sicks, MD;  Location: [facility];  Service: Neurosurgery;  Laterality: N/A;   HPI:  Pt is a 50 yo female with h/o Morbid Obesity, DM, Asthma, Depression, Anxiety, HLD, C5-6 anterior cervical discectomy with interody fusion, depression, gastric bypass, found down by EMS at home. Hx per mother since patient is altered and lethargic. Mother said that she spoke with patient about 3p yesterday, didn't hear from her lastnight or today, at which point Police was called, she was found on the side of the bed, EMS brought her to the ER. Upon arrival to the ER, her mentation continued to decline, she was found to be in acute rhabdomyolsis with acute renal failure and septic shock. She was intubated in the ER for airway protection.  family states that for the past 2 weeks she has had profound diarrhea, and took "an antibiotic" for 5 days, which was given by per PMD.  Pt has had recent falls per report.  Currently, pt is NPO sec. to oral intubation at admission; extubated this morning.  Pt's vocal quality is c/b dysphonia post extubation.  She has tolerated ice chips w/ NSG w/ no reported  s/s of aspiration.    Assessment / Plan / Recommendation Clinical Impression  Pt appeared to adequately tolerate trials of thin liquids, purees, and soft solids w/ no overt s/s of aspiration noted; no decline in respiratory status or change in HR/RR or O2 sats during po trials. Pt's vocal quality remained at its baseline of hoarse but not wet. Oral phase appeared wfl for bolus management w/ all trial consistencies; adequate clearing b/t trials. Pt helped to feed self but required assistance d/t general weakness overall. Pt will require feeding support and assistance in positioning upright in bed for all oral intake. Pt appears at reduced risk for aspiration w/ a regular(cut meats) diet w/ thin liquids following general aspiration precautions. Education given to pt and family on need to choose foods easy to chew/swallow at this time; not take large bites and sips; eat/drink slowly. Rec. meds in Puree for easier swallowing if needed at this time. ST will be available to f/u w/ further education if needed or if any change in status while admitted. NSG updated and agreed.     Aspiration Risk   (reduced)    Diet Recommendation  regular(cut meats), thin liquids; general aspiration precautions; assistance at meals  Medication Administration: Whole meds with liquid (or whole in puree for easier swallowing if needed)    Other  Recommendations Recommended Consults:  (Dietician) Oral Care Recommendations: Oral care BID;Staff/trained caregiver to provide oral care   Follow up Recommendations  None    Frequency and Duration            Prognosis Prognosis for Safe Diet Advancement: Good      Swallow Study   General Date of Onset: 07/29/15 HPI: Pt is a 50 yo female with h/o Morbid Obesity, DM, Asthma, Depression, Anxiety, HLD, C5-6 anterior cervical discectomy with interody fusion, depression, gastric bypass, found down by EMS at home. Hx per mother since patient is altered and lethargic. Mother said  that she spoke with patient about 3p yesterday, didn't hear from her lastnight or today, at which point Police was called, she was found on the side of the bed, EMS brought her to the ER. Upon arrival to the ER, her mentation continued to decline, she was found to be in acute rhabdomyolsis with acute renal failure and septic shock. She was intubated in the ER for airway protection.  family states that for the past 2 weeks she has had profound diarrhea, and took "an antibiotic" for 5 days, which was given by per PMD.  Pt has had recent falls per report.  Currently, pt is NPO sec. to oral intubation at admission; extubated this morning.  Pt's vocal quality is c/b dysphonia post extubation.  She has tolerated ice chips w/ NSG w/ no reported s/s of aspiration.  Type of Study: Bedside Swallow Evaluation Previous Swallow Assessment: none reported Diet Prior to this Study: Regular;Thin liquids (per report) Temperature Spikes Noted: No (wbc 15.6 down from yesterday) Respiratory Status: Nasal cannula (3-4 liters) History of Recent Intubation: Yes Length of Intubations (days): 2 days Date extubated: 07/31/15 Behavior/Cognition: Alert;Cooperative;Pleasant mood (min. drowsy but remained awake for evaluation) Oral Cavity Assessment: Within Functional Limits Oral Care Completed by SLP: Recent completion by staff Oral Cavity - Dentition: Adequate natural dentition Vision: Functional for self-feeding Self-Feeding Abilities: Able to feed self;Needs assist;Needs set up Patient Positioning: Upright in bed Baseline Vocal Quality: Hoarse;Low vocal intensity (dysphonia) Volitional Cough: Weak Volitional Swallow: Able to elicit    Oral/Motor/Sensory Function Overall Oral Motor/Sensory Function: Within functional limits   Ice Chips Ice chips: Within functional limits Presentation: Spoon (fed; 3 trials)   Thin Liquid Thin Liquid: Within functional limits Presentation: Cup;Self Fed;Straw (w/ support; 5 trials w/  each) Other Comments: pt took small, single sips as instructed    Nectar Thick Nectar Thick Liquid: Not tested   Honey Thick Honey Thick Liquid: Not tested   Puree Puree: Within functional limits Presentation: Self Fed;Spoon (assisted; 2-3 ozs)   Solid   GO   Solid: Within functional limits (softened) Presentation: Spoon (fed; 4 trials) Other Comments: slower mastication/effort       Jerilynn SomKatherine Watson, MS, CCC-SLP  Watson,Katherine 07/31/2015,3:02 PM

## 2015-07-31 NOTE — Progress Notes (Signed)
Patient transferred to hospitalist service  oliguric ARF and severe hyperkalemia secondary to rhabdo, emergent HD was done 4/26, 4/27 which has resulted in correction  No acute indication for Dialysis at present , extubated today  1. ARF - oliguric. Likely ATN 2. Severe Hyperkerkalemia due to Rhabdomyolysis (CK > 9000->16000) 3. Acute respiratory failure- intubated in ER 4/26 4. Severe Acidosis, Ph 7.10->7.33

## 2015-07-31 NOTE — Care Management Important Message (Signed)
Important Message  Patient Details  Name: Tiffany Gomez Renna Fong MRN: [id number] Date of Birth: Nov 13, 1965   Medicare Important Message Given:  Yes    Eber HongGreene, Geniya Fulgham R, RN 07/31/2015, 5:04 PM

## 2015-07-31 NOTE — Care Management (Signed)
Patient extubated today. She is able to speak in a whisper and responses are appropriate.   Spoke with patient and her mother.  Prior to this admission, patient independent in all her adls and was driving.  Mother and patient add that sometimes the patient gets confused and "falls out"- just like this time for no reason.  Patient has use of a cane at home.  Provided patient with brochure for Medic alert.  Discussed possible discharge options- home with home health and adequate supervision  vs short term skilled nursing.  Physical therapy consult is pending due to femoral line.  There is not a need at present for additional dialysis treatment but is being followed.

## 2015-07-31 NOTE — Progress Notes (Signed)
PT Cancellation Note  Patient Details Name: Tiffany Gomez MRN: [id number] DOB: 1966/01/03   Cancelled Treatment:    Reason Eval/Treat Not Completed: Other (comment) Pt is 50 y.o. F admitted to hospital for lethargic and disoriented behavior. Pt found to have severe hyperkerkalemia and acute respiratory failure. Pt has extensive hx of DM, obesity, depression, HTN, anxiety, gastric bypass, and L leg pain. Pt intubated on 4/26 and extubated on 4/28. Pt receiving hemodialysis via R IJ cath. PT will be held today d/t patient's current condition and was extubated today. Pt has elevated troponin that is rising. Will follow patient and resume treatment when appropriate.     Tiffany Gomez 07/31/2015, 2:20 PM Tiffany Gomez, SPT

## 2015-07-31 NOTE — Progress Notes (Signed)
[facility] Medicine Progess Note    ASSESSMENT/PLAN     50 yo with PMHx of DM, Morbid obesity, C5-6 repair, Asthma, HTN, HLD, now with septic shock, acute renal failure and rhabdomyolysis. Intubated in the ER for airway protection due to AMS/lethargy/Sepsis and impending respiratory failure.   PULMONARY OETT 4/26; 7.5 ETT  VDRF Hypercarbia; ABG reviewed; 7.5, 5/42/94/36.7 c/w mild metabolic alkalosis. Current vent settings, PRVC/500/12/35%/5. Sepsis Hypovolemic shock, continue IVF, wean down pressors.  ?OSA/OHS Rhabdo ARF; on dialysis.   CXR 4/28 reviewed: improved edema, HD and ETT in place. Reduced lung volumes consistent with morbid obesity.   P:  - MV wean as tolerated, weaning trial today. - VAP protocol - WUA/SBT daily - Propofol for sedation, fentanyl for analgesia - ABG prn - CXR now in the AM   CARDIOVASCULAR CVL HTN Hypotension-- currently on levophed , which has been weaned down to 5 mics. Shock - septic and hypovolemic-improved Hyperkalemia Elevated troponin P:  - maintain MAP>65 - vasopressor as needed - hold antihypertensives - hemodynamic monitoring - EKG>>sinus tach. - ECHO>> results pending.  - trend CE x 3  RENAL ARF Rhabdomyolysis Hyperkalemia Severe Metabolic Acidosis and mild respiratory acidosis-improved Elevated Creatinine P:  - Dialysis per nephro.  - bicarb gtt - serial BMP - ABG as needed - ICU electrolyte replacment - trend CK levels   GASTROINTESTINAL Diarrhea Hx of gastric Bypass P:  - diarrhea x 2 weeks per family; will check stool panel.  - empiric Flagyl - stool culture - may consider CT A/P   HEMATOLOGIC Leukocytosis Sepsis P:  -leukemoid reaction and sepsis -treat underlying condition -source of sepsis unknown, most likely GI given history of recent diarrhea -follow CBC  INFECTIOUS Sepsis - possible GI source P:  - Continue zosyn, will discontinue vanco.  - PCT panel  trending upwards, we'll follow - follow up cultures  Micro/culture results: BCx2 4/26 pending.  UC 4/26 pending.  Staph PCR Positive.  MRSA PCR negative.  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   DVT Prophylaxis: Heparin GI Prophylaxis: Famotidine   ---------------------------------------   ----------------------------------------   Name: Tiffany Gomez MRN: [id number] DOB: [birth year]    ADMISSION DATE:  07/29/2015  SUBJECTIVE:   Pt currently on the ventilator, can not provide history or review of systems.    VITAL SIGNS: Temp:  [97.7 F (36.5 C)-98.4 F (36.9 C)] 98.4 F (36.9 C) (04/28 0730) Pulse Rate:  [87-122] 102 (04/28 0800) Resp:  [22-26] 24 (04/28 0800) BP: (78-139)/(33-98) 116/58 mmHg (04/28 0800) SpO2:  [94 %-100 %] 99 % (04/28 0800) FiO2 (%):  [35 %-40 %] 35 % (04/28 0800) Weight:  [404 lb (183.253 kg)] 404 lb (183.253 kg) (04/28 0500) HEMODYNAMICS:   VENTILATOR SETTINGS: Vent Mode:  [-] CPAP;PSV FiO2 (%):  [35 %-40 %] 35 % Set Rate:  [24 bmp] 24 bmp Vt Set:  [500 mL] 500 mL PEEP:  [5 cmH20-10 cmH20] 10 cmH20 INTAKE / OUTPUT:  Intake/Output Summary (Last 24 hours) at 07/31/15 0858 Last data filed at 07/31/15 0837  Gross per 24 hour  Intake 5934.55 ml  Output     95 ml  Net 5839.55 ml    PHYSICAL EXAMINATION: Physical Examination:   VS: BP  116/58 mmHg  Pulse 102  Temp(Src) 98.4 F (36.9 C) (Oral)  Resp 24  Ht 5\' 6"  (1.676 m)  Wt 404 lb (183.253 kg)  BMI 65.24 kg/m2  SpO2 99%  General Appearance: No distress  Neuro:without focal findings, mental status reduced. Pt awakens to voice and touch.  HEENT: PERRLA, EOM  intact. Pulmonary: normal breath sounds   CardiovascularNormal S1,S2.  No m/r/g.   Abdomen: Benign, Soft, non-tender. Obese.  Renal:  No costovertebral tenderness  GU:  Not performed at this time. Endocrine: No evident thyromegaly. Skin:   warm, no rashes, no ecchymosis  Extremities: normal, no cyanosis, clubbing.   LABS:   LABORATORY PANEL:   CBC  Recent Labs Lab 07/31/15 0500  WBC 15.6*  HGB 8.0*  HCT 24.7*  PLT 255    Chemistries   Recent Labs Lab 07/30/15 0335 07/31/15 0500  NA 137 138  K 3.6 3.4*  CL 101 95*  CO2 26 30  GLUCOSE 204* 181*  BUN 27* 28*  CREATININE 3.23* 3.76*  CALCIUM 6.7* 7.0*  MG 1.5*  --   PHOS 4.8*  --   AST  --  236*  ALT  --  76*  ALKPHOS  --  75  BILITOT  --  1.0     Recent Labs Lab 07/30/15 1202 07/30/15 1621 07/30/15 2048 07/31/15 0051 07/31/15 0415 07/31/15 0740  GLUCAP 215* 174* 185* 175* 171* 150*    Recent Labs Lab 07/29/15 2146 07/30/15 0310 07/31/15 0411  PHART 7.16* 7.33* 7.55*  PCO2ART 52* 49* 42  PO2ART 238* 75* 94    Recent Labs Lab 07/29/15 1713 07/29/15 2244 07/31/15 0500  AST 183* 296* 236*  ALT 73* 90* 76*  ALKPHOS 96 84 75  BILITOT 0.6 0.7 1.0  ALBUMIN 3.2* 2.7* 2.1*    Cardiac Enzymes  Recent Labs Lab 07/30/15 0814  TROPONINI 0.39*    RADIOLOGY:  Dg Chest 1 View  07/31/2015  CLINICAL DATA:  Shortness of breath. EXAM: CHEST 1 VIEW COMPARISON:  07/30/2015. FINDINGS: Endotracheal tube and orogastric tube in stable position. Right-sided dialysis catheter again noted with tip in right atrium. Stable cardiomegaly. Persistent low lung volumes with persistent basilar atelectasis. Persistent atelectatic changes right upper lobe. No prominent pleural effusion. No pneumothorax. Prior cervical spine fusion. IMPRESSION: 1. Endotracheal tube and orogastric tube in stable position. Right IJ dialysis catheter stable position, its tips in the right atrium. 2. Progressive bibasilar atelectasis.  Persistent right upper lobe atelectasis. 3. Stable cardiomegaly. Electronically Signed   By: Maisie Fushomas  Register   On: 07/31/2015 07:11   Ct Head Wo Contrast  07/30/2015  CLINICAL DATA:  Altered mental status, lethargy, sepsis. Septic shock. Rhabdomyolysis. Status post fall. EXAM: CT HEAD WITHOUT CONTRAST TECHNIQUE: Contiguous axial images were obtained from the base of the skull through the vertex without intravenous contrast. COMPARISON:  None. FINDINGS: No evidence of parenchymal hemorrhage or extra-axial fluid collection. No mass lesion, mass effect, or midline shift. No CT evidence of acute infarction. Cerebral volume is within normal limits.  No ventriculomegaly. The visualized paranasal sinuses are essentially clear. The mastoid air cells are unopacified. No evidence of calvarial fracture. IMPRESSION: No evidence of acute intracranial abnormality. Electronically Signed   By: Charline BillsSriyesh  Krishnan M.D.   On: 07/30/2015 14:45   Portable Chest Xray  07/30/2015  CLINICAL DATA:  Respiratory failure EXAM: PORTABLE CHEST 1 VIEW COMPARISON:  Yesterday FINDINGS: Endotracheal tube tip remains at the clavicular heads. An orogastric tube reaches the stomach. Right-sided dialysis catheter  with tip at the upper atrium. Perihilar atelectasis, increased in the right upper lobe. Lung volumes are low. Chronic cardiomegaly. No edema, effusion, or air leak. IMPRESSION: 1. Unchanged positioning of tubes and dialysis catheter. 2. Low lung volumes with progressive atelectasis. Electronically Signed   By: Marnee Spring M.D.   On: 07/30/2015 04:50   Dg Chest Port 1 View  07/29/2015  CLINICAL DATA:  Dialysis catheter placement.  Initial encounter. EXAM: PORTABLE CHEST 1 VIEW COMPARISON:  Chest radiograph performed earlier today at 7:47 p.m. FINDINGS: The patient's endotracheal tube is seen ending 3 cm above the carina. An enteric tube is noted extending below the diaphragm. A right IJ dual-lumen catheter is noted ending about the  proximal right atrium, 2 cm below the cavoatrial junction. The lungs are hypoexpanded. No pleural effusion or pneumothorax is seen. No masses are identified. The cardiomediastinal silhouette is borderline normal in size. No acute osseous abnormalities are identified. IMPRESSION: 1. Endotracheal tube seen ending 3 cm above the carina. 2. Right IJ dual-lumen catheter noted ending about the proximal right atrium, 2 cm below the cavoatrial junction. 3. Lungs hypoexpanded but grossly clear. Electronically Signed   By: Roanna Raider M.D.   On: 07/29/2015 23:21   Dg Chest Portable 1 View  07/29/2015  CLINICAL DATA:  Status post intubation. EXAM: PORTABLE CHEST 1 VIEW COMPARISON:  Same day. FINDINGS: Stable cardiomegaly. No pneumothorax is noted. No significant pleural effusion is noted. Right basilar opacity is noted concerning for pneumonia or subsegmental atelectasis. Endotracheal tube is seen projected over the tracheal air shadow with distal tip 2 cm above the carina. Bony thorax is unremarkable. IMPRESSION: Endotracheal tube in grossly good position. Mild right basilar subsegmental atelectasis or pneumonia is noted. Electronically Signed   By: Lupita Raider, M.D.   On: 07/29/2015 20:10   Dg Chest Portable 1 View  07/29/2015  CLINICAL DATA:  Acute onset of disorientation and lethargy. Vomiting and diarrhea. Initial encounter. EXAM: PORTABLE CHEST 1 VIEW COMPARISON:  Chest radiograph performed 05/30/2014 FINDINGS: The lungs are hypoexpanded. Mild vascular crowding and vascular congestion are seen. There is no evidence of focal opacification, pleural effusion or pneumothorax. The cardiomediastinal silhouette is borderline enlarged. No acute osseous abnormalities are seen. IMPRESSION: Mild vascular congestion and borderline cardiomegaly. Lungs remain grossly clear. Electronically Signed   By: Roanna Raider M.D.   On: 07/29/2015 18:26   Dg Abd Portable 1v  07/29/2015  CLINICAL DATA:  Orogastric tube placement.   Initial encounter. EXAM: PORTABLE ABDOMEN - 1 VIEW COMPARISON:  CT of the abdomen and pelvis from 08/03/2011 FINDINGS: The patient's enteric tube is noted ending overlying the body of the stomach, with the side port about the gastroesophageal junction. The visualized bowel gas pattern is grossly unremarkable. No free intra-abdominal air is seen, though evaluation for free air is limited on supine views. No acute osseous abnormalities are identified. IMPRESSION: Enteric tube noted ending overlying the body of the stomach, with the side port about the gastroesophageal junction. Electronically Signed   By: Roanna Raider M.D.   On: 07/29/2015 23:22       --Wells Guiles, MD.  ICU Pager: [phone] [facility] Office Number: [phone]  Santiago Glad, M.D.  Stephanie Acre, M.D.  Billy Fischer, M.D  07/31/2015 Critical Care Attestation.  I have personally obtained a history, examined the patient, evaluated laboratory and imaging results, formulated the assessment and plan and placed orders. The Patient requires high complexity decision making for assessment and support,  frequent evaluation and titration of therapies, application of advanced monitoring technologies and extensive interpretation of multiple databases. The patient has critical illness that could lead imminently to failure of 1 or more organ systems and requires the highest level of physician preparedness to intervene.  Critical Care Time devoted to patient care services described in this note is 35 minutes and is exclusive of time spent in procedures.

## 2015-07-31 NOTE — Progress Notes (Signed)
*  PRELIMINARY RESULTS* Echocardiogram 2D Echocardiogram has been performed.  Georgann HousekeeperJerry R Hege 07/31/2015, 11:20 AM

## 2015-07-31 NOTE — Consult Note (Signed)
Pharmacy Antibiotic Note  Tiffany Gomez is a 50 y.o. female admitted on 07/29/2015 with sepsis and ARF.  Pharmacy has been consulted for vancomycin, metronidazole, zosyn dosing. Suspected source is GI.   Plan: After discussion with Dr. Nicholos Johnsamachandran, will d/c vancomycin and Flagyl.   Will continue Zosyn 3.375 g EI q 12 hours. Patient remains in ARF and received HD 4/26 and 4/27.   Height: 5\' 6"  (167.6 cm) Weight: (!) 404 lb (183.253 kg) IBW/kg (Calculated) : 59.3  Temp (24hrs), Avg:98 F (36.7 C), Min:97.7 F (36.5 C), Max:98.4 F (36.9 C)   Recent Labs Lab 07/29/15 1713 07/29/15 2244 07/30/15 0335 07/31/15 0500  WBC 30.2* 22.4* 20.1* 15.6*  CREATININE 5.83* 4.66*  4.57* 3.23* 3.76*  LATICACIDVEN  --  1.8  --   --     Estimated Creatinine Clearance: 31.8 mL/min (by C-G formula based on Cr of 3.76).    No Known Allergies  Antimicrobials this admission: 4/26 vancomycin >> 4/27 4/26 zosyn >>  4/26 metronidazole >> 4/27  Dose adjustments this admission:   Microbiology results: 4/26 BCx: CNS 1/2 4/26 UCx: negative 4/26 MRSA PCR: negative   Thank you for allowing pharmacy to be a part of this patient's care.  Luisa HartScott Anisha Starliper, PharmD Clinical Pharmacist    07/31/2015 11:10 AM

## 2015-08-01 ENCOUNTER — Inpatient Hospital Stay: Payer: Medicare Other

## 2015-08-01 DIAGNOSIS — T796XXD Traumatic ischemia of muscle, subsequent encounter: Secondary | ICD-10-CM

## 2015-08-01 LAB — RENAL FUNCTION PANEL
Albumin: 2.2 g/dL — ABNORMAL LOW (ref 3.5–5.0)
Anion gap: 16 — ABNORMAL HIGH (ref 5–15)
BUN: 34 mg/dL — ABNORMAL HIGH (ref 6–20)
CHLORIDE: 94 mmol/L — AB (ref 101–111)
CO2: 32 mmol/L (ref 22–32)
CREATININE: 4.64 mg/dL — AB (ref 0.44–1.00)
Calcium: 7.6 mg/dL — ABNORMAL LOW (ref 8.9–10.3)
GFR calc non Af Amer: 10 mL/min — ABNORMAL LOW (ref >60)
GFR, EST AFRICAN AMERICAN: 12 mL/min — AB (ref >60)
GLUCOSE: 137 mg/dL — AB (ref 65–99)
Phosphorus: 4.5 mg/dL (ref 2.5–4.6)
Potassium: 3.2 mmol/L — ABNORMAL LOW (ref 3.5–5.1)
SODIUM: 142 mmol/L (ref 135–145)

## 2015-08-01 LAB — CBC
HCT: 23.3 % — ABNORMAL LOW (ref 35.0–47.0)
HEMOGLOBIN: 7.6 g/dL — AB (ref 12.0–16.0)
MCH: 27 pg (ref 26.0–34.0)
MCHC: 32.4 g/dL (ref 32.0–36.0)
MCV: 83.2 fL (ref 80.0–100.0)
PLATELETS: 234 10*3/uL (ref 150–440)
RBC: 2.81 MIL/uL — AB (ref 3.80–5.20)
RDW: 14.5 % (ref 11.5–14.5)
WBC: 15.9 10*3/uL — ABNORMAL HIGH (ref 3.6–11.0)

## 2015-08-01 LAB — GLUCOSE, CAPILLARY
GLUCOSE-CAPILLARY: 115 mg/dL — AB (ref 65–99)
Glucose-Capillary: 184 mg/dL — ABNORMAL HIGH (ref 65–99)
Glucose-Capillary: 126 mg/dL — ABNORMAL HIGH (ref 65–99)

## 2015-08-01 MED ORDER — LINAGLIPTIN 5 MG PO TABS
5.0000 mg | ORAL_TABLET | Freq: Every day | ORAL | Status: DC
  Administered 2015-08-01 – 2015-08-05 (×5): 5 mg via ORAL
  Filled 2015-08-01 (×5): qty 1

## 2015-08-01 MED ORDER — POTASSIUM CHLORIDE CRYS ER 20 MEQ PO TBCR
20.0000 meq | EXTENDED_RELEASE_TABLET | Freq: Once | ORAL | Status: AC
Start: 2015-08-01 — End: 2015-08-01
  Administered 2015-08-01: 20 meq via ORAL
  Filled 2015-08-01: qty 1

## 2015-08-01 MED ORDER — IPRATROPIUM-ALBUTEROL 0.5-2.5 (3) MG/3ML IN SOLN: 3.0000 mL | RESPIRATORY_TRACT | Status: DC | PRN

## 2015-08-01 MED ORDER — PRAVASTATIN SODIUM 20 MG PO TABS
20.0000 mg | ORAL_TABLET | Freq: Every day | ORAL | Status: DC
  Administered 2015-08-01 – 2015-08-04 (×4): 20 mg via ORAL
  Filled 2015-08-01 (×4): qty 1

## 2015-08-01 MED ORDER — ALBUTEROL SULFATE HFA 108 (90 BASE) MCG/ACT IN AERS: 2.0000 | INHALATION_SPRAY | Freq: Four times a day (QID) | RESPIRATORY_TRACT | Status: DC | PRN

## 2015-08-01 MED ORDER — MORPHINE SULFATE (PF) 2 MG/ML IV SOLN: 2.0000 mg | INTRAVENOUS | Status: DC | PRN

## 2015-08-01 MED ORDER — ALBUTEROL SULFATE (2.5 MG/3ML) 0.083% IN NEBU: 2.5000 mg | INHALATION_SOLUTION | Freq: Four times a day (QID) | RESPIRATORY_TRACT | Status: DC | PRN

## 2015-08-01 MED ORDER — CLONAZEPAM 1 MG PO TABS
1.0000 mg | ORAL_TABLET | Freq: Every day | ORAL | Status: DC
  Administered 2015-08-01 – 2015-08-04 (×4): 1 mg via ORAL
  Filled 2015-08-01 (×4): qty 1

## 2015-08-01 MED ORDER — VENLAFAXINE HCL 25 MG PO TABS
75.0000 mg | ORAL_TABLET | Freq: Two times a day (BID) | ORAL | Status: DC
  Administered 2015-08-01 – 2015-08-05 (×8): 75 mg via ORAL
  Filled 2015-08-01: qty 1
  Filled 2015-08-01 (×6): qty 3
  Filled 2015-08-01: qty 1
  Filled 2015-08-01 (×3): qty 3

## 2015-08-01 MED ORDER — DOCUSATE SODIUM 100 MG PO CAPS
100.0000 mg | ORAL_CAPSULE | Freq: Two times a day (BID) | ORAL | Status: DC
  Administered 2015-08-01 – 2015-08-05 (×9): 100 mg via ORAL
  Filled 2015-08-01 (×9): qty 1

## 2015-08-01 MED ORDER — POLYETHYLENE GLYCOL 3350 17 G PO PACK
17.0000 g | PACK | Freq: Every day | ORAL | Status: DC
Start: 2015-08-01 — End: 2015-08-05
  Administered 2015-08-03: 17 g via ORAL
  Filled 2015-08-01 (×5): qty 1

## 2015-08-01 MED ORDER — INSULIN ASPART 100 UNIT/ML ~~LOC~~ SOLN
0.0000 [IU] | Freq: Three times a day (TID) | SUBCUTANEOUS | Status: DC
  Administered 2015-08-01: 3 [IU] via SUBCUTANEOUS
  Administered 2015-08-01 – 2015-08-03 (×4): 2 [IU] via SUBCUTANEOUS
  Administered 2015-08-03: 3 [IU] via SUBCUTANEOUS
  Administered 2015-08-04: 2 [IU] via SUBCUTANEOUS
  Administered 2015-08-04: 3 [IU] via SUBCUTANEOUS
  Administered 2015-08-05: 2 [IU] via SUBCUTANEOUS
  Filled 2015-08-01 (×3): qty 3
  Filled 2015-08-01 (×6): qty 2

## 2015-08-01 MED ORDER — ZIPRASIDONE HCL 80 MG PO CAPS
160.0000 mg | ORAL_CAPSULE | Freq: Every day | ORAL | Status: DC
  Administered 2015-08-01 – 2015-08-04 (×4): 160 mg via ORAL
  Filled 2015-08-01 (×6): qty 2

## 2015-08-01 MED ORDER — ALPRAZOLAM 0.5 MG PO TABS
2.0000 mg | ORAL_TABLET | Freq: Four times a day (QID) | ORAL | Status: DC | PRN
  Administered 2015-08-04: 2 mg via ORAL
  Filled 2015-08-01: qty 4

## 2015-08-01 NOTE — Progress Notes (Signed)
Speech Therapy Note: reviewed chart notes and met w/ family/Pt. Pt and family reported good toleration of po meals w/ no swallowing difficulties. Noted pt's vocal quality and volume of speech appeared improved from yesterday; family agreed. ST will sign off at this time w/ NSG to reconsult if any change in status. Pt agreed. 

## 2015-08-01 NOTE — Progress Notes (Signed)
Subjective:   Patient Is extubated now Urine output has improved to 1100 cc Serum creatinine however is higher at 4.64 from 3.76 yesterday Potassium is low at 3.2      Objective:  Vital signs in last 24 hours:  Temp:  [98.2 F (36.8 C)-98.3 F (36.8 C)] 98.2 F (36.8 C) (04/29 0800) Pulse Rate:  [97-117] 104 (04/29 1000) Resp:  [15-31] 15 (04/29 1000) BP: (98-144)/(58-109) 136/95 mmHg (04/29 1000) SpO2:  [85 %-99 %] 99 % (04/29 1000) Weight:  [180.078 kg (397 lb)] 180.078 kg (397 lb) (04/29 0500)  Weight change: -3.175 kg (-7 lb) Filed Weights   07/30/15 0554 07/31/15 0500 08/01/15 0500  Weight: 183.253 kg (404 lb) 183.253 kg (404 lb) 180.078 kg (397 lb)    Intake/Output:    Intake/Output Summary (Last 24 hours) at 08/01/15 1119 Last data filed at 08/01/15 0500  Gross per 24 hour  Intake    290 ml  Output   1450 ml  Net  -1160 ml     Physical Exam: General:  critically ill appearing   HEENT  Anicteric, moist oral mucous membranes   Neck  right IJ dialysis catheter   Pulm/lungs  coarse breath sounds, ventilator dependent   CVS/Heart  regular, tachycardic   Abdomen:  Soft, NT, non distended, obese  Extremities: + dependent edema  Neurologic: Alert, able to follow commands   Skin: No acute rashes  Access: Rt IJ vascath (4/26) Dr Dema Severin       Basic Metabolic Panel:   Recent Labs Lab 07/29/15 1713 07/29/15 2244 07/30/15 0335 07/31/15 0500 08/01/15 0534  NA 132* 135 137 138 142  K 6.9* 4.9 3.6 3.4* 3.2*  CL 98* 105 101 95* 94*  CO2 16* 21* 26 30 32  GLUCOSE 238* 265* 204* 181* 137*  BUN 36* 38* 27* 28* 34*  CREATININE 5.83* 4.66*  4.57* 3.23* 3.76* 4.64*  CALCIUM 7.8* 6.4* 6.7* 7.0* 7.6*  MG  --  1.6* 1.5*  --   --   PHOS  --  6.6* 4.8*  --  4.5     CBC:  Recent Labs Lab 07/29/15 1713 07/29/15 2244 07/30/15 0335 07/31/15 0500 08/01/15 0544  WBC 30.2* 22.4* 20.1* 15.6* 15.9*  HGB 11.7* 10.4* 10.3* 8.0* 7.6*  HCT 37.9 33.0* 32.6* 24.7*  23.3*  MCV 88.3 86.6 85.0 84.5 83.2  PLT 396 376 332 255 234      Microbiology:  Recent Results (from the past 720 hour(s))  Culture, blood (routine x 2)     Status: None (Preliminary result)   Collection Time: 07/29/15  5:13 PM  Result Value Ref Range Status   Specimen Description BLOOD RIGHT FATTY CASTS  Final   Special Requests BOTTLES DRAWN AEROBIC AND ANAEROBIC  1CC  Final   Culture  Setup Time   Final    GRAM POSITIVE COCCI ANAEROBIC BOTTLE ONLY CRITICAL RESULT CALLED TO, READ BACK BY AND VERIFIED WITH: JASON ROBBINS AT 2110 07/30/15 [facility].    Culture   Final    COAGULASE NEGATIVE STAPHYLOCOCCUS ANAEROBIC BOTTLE ONLY THE SIGNIFICANCE OF ISOLATING THIS ORGANISM FROM A SINGLE VENIPUNCTURE CANNOT BE PREDICTED WITHOUT FURTHER CLINICAL AND CULTURE CORRELATION. SUSCEPTIBILITIES AVAILABLE ONLY ON REQUEST.    Report Status PENDING  Incomplete  Urine culture     Status: None   Collection Time: 07/29/15  5:13 PM  Result Value Ref Range Status   Specimen Description URINE, CATHETERIZED  Final   Special Requests NONE  Final   Culture  NO GROWTH 2 DAYS  Final   Report Status 07/31/2015 FINAL  Final  Blood Culture ID Panel (Reflexed)     Status: Abnormal   Collection Time: 07/29/15  5:13 PM  Result Value Ref Range Status   Enterococcus species NOT DETECTED NOT DETECTED Final   Vancomycin resistance NOT DETECTED NOT DETECTED Final   Listeria monocytogenes NOT DETECTED NOT DETECTED Final   Staphylococcus species DETECTED (A) NOT DETECTED Final    Comment: CRITICAL RESULT CALLED TO, READ BACK BY AND VERIFIED WITH: JASON ROBBINS AT 2110 07/30/15 KLK.    Staphylococcus aureus NOT DETECTED NOT DETECTED Final   Methicillin resistance NOT DETECTED NOT DETECTED Final   Streptococcus species NOT DETECTED NOT DETECTED Final   Streptococcus agalactiae NOT DETECTED NOT DETECTED Final   Streptococcus pneumoniae NOT DETECTED NOT DETECTED Final   Streptococcus pyogenes NOT DETECTED NOT DETECTED  Final   Acinetobacter baumannii NOT DETECTED NOT DETECTED Final   Enterobacteriaceae species NOT DETECTED NOT DETECTED Final   Enterobacter cloacae complex NOT DETECTED NOT DETECTED Final   Escherichia coli NOT DETECTED NOT DETECTED Final   Klebsiella oxytoca NOT DETECTED NOT DETECTED Final   Klebsiella pneumoniae NOT DETECTED NOT DETECTED Final   Proteus species NOT DETECTED NOT DETECTED Final   Serratia marcescens NOT DETECTED NOT DETECTED Final   Carbapenem resistance NOT DETECTED NOT DETECTED Final   Haemophilus influenzae NOT DETECTED NOT DETECTED Final   Neisseria meningitidis NOT DETECTED NOT DETECTED Final   Pseudomonas aeruginosa NOT DETECTED NOT DETECTED Final   Candida albicans NOT DETECTED NOT DETECTED Final   Candida glabrata NOT DETECTED NOT DETECTED Final   Candida krusei NOT DETECTED NOT DETECTED Final   Candida parapsilosis NOT DETECTED NOT DETECTED Final   Candida tropicalis NOT DETECTED NOT DETECTED Final  Culture, blood (routine x 2)     Status: None (Preliminary result)   Collection Time: 07/29/15  5:20 PM  Result Value Ref Range Status   Specimen Description BLOOD LEFT ASSIST CONTROL  Final   Special Requests BOTTLES DRAWN AEROBIC AND ANAEROBIC 1CC  Final   Culture NO GROWTH 2 DAYS  Final   Report Status PENDING  Incomplete  MRSA PCR Screening     Status: None   Collection Time: 07/29/15 10:42 PM  Result Value Ref Range Status   MRSA by PCR NEGATIVE NEGATIVE Final    Comment:        The GeneXpert MRSA Assay (FDA approved for NASAL specimens only), is one component of a comprehensive MRSA colonization surveillance program. It is not intended to diagnose MRSA infection nor to guide or monitor treatment for MRSA infections.     Coagulation Studies: No results for input(s): LABPROT, INR in the last 72 hours.  Urinalysis:  Recent Labs  07/29/15 1713 07/30/15 0208  COLORURINE AMBER* AMBER*  LABSPEC 1.020 1.016  PHURINE 5.0 5.0  GLUCOSEU NEGATIVE  NEGATIVE  HGBUR NEGATIVE 3+*  BILIRUBINUR NEGATIVE NEGATIVE  KETONESUR NEGATIVE NEGATIVE  PROTEINUR 30* 100*  NITRITE NEGATIVE NEGATIVE  LEUKOCYTESUR NEGATIVE NEGATIVE      Imaging: Dg Chest 1 View  08/01/2015  CLINICAL DATA:  Dyspnea EXAM: CHEST 1 VIEW COMPARISON:  07/31/2015 FINDINGS: Hypoventilatory radiograph. Stable right central line. Other support devices have been removed. Stable cardiac enlargement. There is vascular congestion and the bilateral perihilar vessels are indistinct. Stable lower lobe atelectasis. IMPRESSION: Stable bilateral atelectasis. Findings also concerning for mild pulmonary edema. Electronically Signed   By: Esperanza Heiraymond  Rubner M.D.   On: 08/01/2015 08:08  Dg Chest 1 View  07/31/2015  CLINICAL DATA:  Shortness of breath. EXAM: CHEST 1 VIEW COMPARISON:  07/30/2015. FINDINGS: Endotracheal tube and orogastric tube in stable position. Right-sided dialysis catheter again noted with tip in right atrium. Stable cardiomegaly. Persistent low lung volumes with persistent basilar atelectasis. Persistent atelectatic changes right upper lobe. No prominent pleural effusion. No pneumothorax. Prior cervical spine fusion. IMPRESSION: 1. Endotracheal tube and orogastric tube in stable position. Right IJ dialysis catheter stable position, its tips in the right atrium. 2. Progressive bibasilar atelectasis. Persistent right upper lobe atelectasis. 3. Stable cardiomegaly. Electronically Signed   By: Maisie Fus  Register   On: 07/31/2015 07:11   Ct Head Wo Contrast  07/30/2015  CLINICAL DATA:  Altered mental status, lethargy, sepsis. Septic shock. Rhabdomyolysis. Status post fall. EXAM: CT HEAD WITHOUT CONTRAST TECHNIQUE: Contiguous axial images were obtained from the base of the skull through the vertex without intravenous contrast. COMPARISON:  None. FINDINGS: No evidence of parenchymal hemorrhage or extra-axial fluid collection. No mass lesion, mass effect, or midline shift. No CT evidence of  acute infarction. Cerebral volume is within normal limits.  No ventriculomegaly. The visualized paranasal sinuses are essentially clear. The mastoid air cells are unopacified. No evidence of calvarial fracture. IMPRESSION: No evidence of acute intracranial abnormality. Electronically Signed   By: Charline Bills M.D.   On: 07/30/2015 14:45     Medications:     . clonazePAM  1 mg Oral QHS  . docusate sodium  100 mg Oral BID  . heparin subcutaneous  5,000 Units Subcutaneous Q8H  . insulin aspart  0-15 Units Subcutaneous TID WC  . linagliptin  5 mg Oral Daily  . piperacillin-tazobactam (ZOSYN)  IV  3.375 g Intravenous Q12H  . polyethylene glycol  17 g Oral Daily  . pravastatin  20 mg Oral QHS  . sodium chloride flush  10-40 mL Intracatheter Q12H  . venlafaxine  75 mg Oral BID WC  . ziprasidone  160 mg Oral Q supper   sodium chloride, acetaminophen, albuterol, alprazolam, ipratropium-albuterol, morphine injection, ondansetron (ZOFRAN) IV  Assessment/ Plan:  50 y.o. female with a PMHX of morbid obesity, DM, OSA, Depression, HTN, HLD, Gastric Bypass, Cervical disc fusion, chronic pain (lumbar radiculopathy), was admitted on 07/29/2015 after being found unresponsive at home  1. ARF - Likely ATN, Patient was hypotensive At the time of presentation. Other contributing factors include metformin, ACE-i, Ibuprofen, Rhabdomyolysis 2. Severe Hyperkerkalemia due to Rhabdomyolysis (CK > 9000->16000), now hypokalemia 3. Acute respiratory failure- intubated in ER 4/26 4. Severe Acidosis, Ph 7.10->7.33 5. Altered mental status 6. Morbid obesity  Plan:  Urine output has improved to greater than 1000 cc  Electrolytes and Volume status are acceptable No acute indication for Dialysis at present  Monitor UOP and electrolytes closely Replace potassium as necessary    LOS: 3 Cassey Hurrell 4/29/201711:19 AM

## 2015-08-01 NOTE — Progress Notes (Signed)
Paged & spoke to Dr. Juliene PinaMody regarding pt potassium of 3.2. No replacement had been ordered while in CCU.   Verbally received order for 1x dose of 20mEq of potassium chloride.

## 2015-08-01 NOTE — Progress Notes (Signed)
PT Cancellation Note  Patient Details Name: Tiffany Gomez MRN: [id number] DOB: 09/16/1965   Cancelled Treatment:    Reason Eval/Treat Not Completed: Medical issues which prohibited therapy (troponin continuing to elevate).  Check again tomorrow.   Tiffany Gomez, Tiffany Gomez 08/01/2015, 9:09 AM   Tiffany Gomez, PT MS Acute Rehab Dept. Number: [facility] [phone] and [facility] [phone]

## 2015-08-01 NOTE — Progress Notes (Signed)
Report to JD on 1A, all questions answered.

## 2015-08-01 NOTE — Progress Notes (Addendum)
[facility] [place] at [facility]   PATIENT NAME: Tiffany Gomez    MR#:  [id number]  DATE OF BIRTH:  [birth year]  SUBJECTIVE:  Patient is extubated and doing well.   REVIEW OF SYSTEMS:    Review of Systems  Constitutional: Negative for fever, chills and malaise/fatigue.  HENT: Negative for ear discharge, ear pain, hearing loss, nosebleeds and sore throat.   Eyes: Negative for blurred vision and pain.  Respiratory: Negative for cough, hemoptysis, shortness of breath and wheezing.   Cardiovascular: Negative for chest pain, palpitations and leg swelling.  Gastrointestinal: Negative for nausea, vomiting, abdominal pain, diarrhea and blood in stool.  Genitourinary: Negative for dysuria.  Musculoskeletal: Negative for back pain.  Neurological: Negative for dizziness, tremors, speech change, focal weakness, seizures and headaches.  Endo/Heme/Allergies: Does not bruise/bleed easily.  Psychiatric/Behavioral: Negative for depression, suicidal ideas and hallucinations.    Tolerating Diet:yes      DRUG ALLERGIES:  No Known Allergies  VITALS:  Blood pressure 136/95, pulse 104, temperature 98.2 F (36.8 C), temperature source Oral, resp. rate 15, height 5\' 6"  (1.676 m), weight 180.078 kg (397 lb), SpO2 99 %.  PHYSICAL EXAMINATION:   Physical Exam  Constitutional: She is oriented to person, place, and time and well-developed, well-nourished, and in no distress. No distress.  HENT:  Head: Normocephalic.  Eyes: No scleral icterus.  Neck: Normal range of motion. Neck supple. No JVD present. No tracheal deviation present.  Cardiovascular: Normal rate, regular rhythm and normal heart sounds.  Exam reveals no gallop and no friction rub.   No murmur heard. Pulmonary/Chest: Effort normal and breath sounds normal. No respiratory distress. She has no wheezes. She has no rales. She exhibits no tenderness.  Abdominal: Soft. Bowel sounds are normal. She exhibits no distension  and no mass. There is no tenderness. There is no rebound and no guarding.  Musculoskeletal: Normal range of motion. She exhibits edema.  Neurological: She is alert and oriented to person, place, and time.  Skin: Skin is warm. No rash noted. No erythema.  Psychiatric: Affect and judgment normal.      LABORATORY PANEL:   CBC  Recent Labs Lab 08/01/15 0544  WBC 15.9*  HGB 7.6*  HCT 23.3*  PLT 234   ------------------------------------------------------------------------------------------------------------------  Chemistries   Recent Labs Lab 07/30/15 0335 07/31/15 0500 08/01/15 0534  NA 137 138 142  K 3.6 3.4* 3.2*  CL 101 95* 94*  CO2 26 30 32  GLUCOSE 204* 181* 137*  BUN 27* 28* 34*  CREATININE 3.23* 3.76* 4.64*  CALCIUM 6.7* 7.0* 7.6*  MG 1.5*  --   --   AST  --  236*  --   ALT  --  76*  --   ALKPHOS  --  75  --   BILITOT  --  1.0  --    ------------------------------------------------------------------------------------------------------------------  Cardiac Enzymes  Recent Labs Lab 07/29/15 2244 07/30/15 0209 07/30/15 0814  TROPONINI 0.18* 0.22* 0.39*   ------------------------------------------------------------------------------------------------------------------  RADIOLOGY:  Dg Chest 1 View  08/01/2015  CLINICAL DATA:  Dyspnea EXAM: CHEST 1 VIEW COMPARISON:  07/31/2015 FINDINGS: Hypoventilatory radiograph. Stable right central line. Other support devices have been removed. Stable cardiac enlargement. There is vascular congestion and the bilateral perihilar vessels are indistinct. Stable lower lobe atelectasis. IMPRESSION: Stable bilateral atelectasis. Findings also concerning for mild pulmonary edema. Electronically Signed   By: Esperanza Heiraymond  Rubner M.D.   On: 08/01/2015 08:08   Dg Chest 1 View  07/31/2015  CLINICAL DATA:  Shortness of breath. EXAM: CHEST 1 VIEW COMPARISON:  07/30/2015. FINDINGS: Endotracheal tube and orogastric tube in stable position.  Right-sided dialysis catheter again noted with tip in right atrium. Stable cardiomegaly. Persistent low lung volumes with persistent basilar atelectasis. Persistent atelectatic changes right upper lobe. No prominent pleural effusion. No pneumothorax. Prior cervical spine fusion. IMPRESSION: 1. Endotracheal tube and orogastric tube in stable position. Right IJ dialysis catheter stable position, its tips in the right atrium. 2. Progressive bibasilar atelectasis. Persistent right upper lobe atelectasis. 3. Stable cardiomegaly. Electronically Signed   By: Maisie Fus  Register   On: 07/31/2015 07:11   Ct Head Wo Contrast  07/30/2015  CLINICAL DATA:  Altered mental status, lethargy, sepsis. Septic shock. Rhabdomyolysis. Status post fall. EXAM: CT HEAD WITHOUT CONTRAST TECHNIQUE: Contiguous axial images were obtained from the base of the skull through the vertex without intravenous contrast. COMPARISON:  None. FINDINGS: No evidence of parenchymal hemorrhage or extra-axial fluid collection. No mass lesion, mass effect, or midline shift. No CT evidence of acute infarction. Cerebral volume is within normal limits.  No ventriculomegaly. The visualized paranasal sinuses are essentially clear. The mastoid air cells are unopacified. No evidence of calvarial fracture. IMPRESSION: No evidence of acute intracranial abnormality. Electronically Signed   By: Charline Bills M.D.   On: 07/30/2015 14:45     ASSESSMENT AND PLAN:   50 year old female with a history of diabetes and morbid obesity who presented with septic shock with hyper Acute respiratory failure, acute renal failure and rhabdomyolysis  1. Acute hypercapnic respiratory failure: Patient is now extubated and doing well. She needs outpatient sleep study reevaluation.  2. Acute renal failure: Initially patient had oliguric renal failure and severe hyperkalemia and required emergent dialysis. No dialysis for today.  Patient with good urine output. Follow  creatinine. Appreciate renal consult.  3. Rhabdomyolysis: This is improving.  4. Possible sepsis: Sepsis is been ruled out.  5. Diabetes: Continue TRADJENTA and send scale insulin with ADA diet.  Management plans discussed with the patient and she is in agreement.  CODE STATUS: FULL  TOTAL TIME TAKING CARE OF THIS PATIENT: 30 minutes.    D.w family at bedside POSSIBLE D/C 1-2 days, DEPENDING ON CLINICAL CONDITION. PT consult and transfer   Jennett Tarbell M.D on 08/01/2015 at 11:11 AM  Between 7am to 6pm - Pager - [phone] After 6pm go to [email or web address] - Social research officer, government  [facility]  Office  [phone]  CC: Primary care physician; WHITE, Valentina Shaggy, FNP  Note: This dictation was prepared with Dragon dictation along with smaller phrase technology. Any transcriptional errors that result from this process are unintentional.

## 2015-08-01 NOTE — Progress Notes (Signed)
Dr. Sung AmabileSimonds aware of lab values, no orders at this time.

## 2015-08-01 NOTE — Progress Notes (Signed)
She has tolerated extubation without difficulty Now transferred out of ICU/SDU Please remove R femoral CVL if alternative IV access can be established Please DC HD cath when it is deemed no longer needed  PCCM will sign off. Please call if we can be of further assistance  Billy Fischeravid Tu Bayle, MD PCCM service Mobile [phone] Pager [phone] 08/01/2015

## 2015-08-01 NOTE — Consult Note (Signed)
Pharmacy Antibiotic Note  Tiffany Gomez is a 50 y.o. female admitted on 07/29/2015 with sepsis and ARF.  Pharmacy has been consulted for  zosyn dosing. Suspected source is GI.   Plan:  Will continue Zosyn 3.375 g EI q 12 hours. Patient remains in ARF and received HD 4/26 and 4/27.   Height: 5\' 6"  (167.6 cm) Weight: (!) 397 lb (180.078 kg) IBW/kg (Calculated) : 59.3  Temp (24hrs), Avg:98.3 F (36.8 C), Min:98.2 F (36.8 C), Max:98.3 F (36.8 C)   Recent Labs Lab 07/29/15 1713 07/29/15 2244 07/30/15 0335 07/31/15 0500 08/01/15 0534 08/01/15 0544  WBC 30.2* 22.4* 20.1* 15.6*  --  15.9*  CREATININE 5.83* 4.66*  4.57* 3.23* 3.76* 4.64*  --   LATICACIDVEN  --  1.8  --   --   --   --     Estimated Creatinine Clearance: 25.5 mL/min (by C-G formula based on Cr of 4.64).    No Known Allergies  Antimicrobials this admission: 4/26 vancomycin >> 4/27 4/26 zosyn >>  4/26 metronidazole >> 4/27  Dose adjustments this admission:   Microbiology results: 4/26 BCx: CNS 1/2 4/26 UCx: negative 4/26 MRSA PCR: negative   Thank you for allowing pharmacy to be a part of this patient's care.  Demetrius Charityeldrin D. Mikell Kazlauskas, PharmD  Clinical Pharmacist    08/01/2015 11:09 AM

## 2015-08-01 NOTE — Progress Notes (Signed)
[facility]

## 2015-08-02 LAB — COMPREHENSIVE METABOLIC PANEL
ALK PHOS: 100 U/L (ref 38–126)
ALT: 72 U/L — ABNORMAL HIGH (ref 14–54)
ANION GAP: 13 (ref 5–15)
AST: 184 U/L — ABNORMAL HIGH (ref 15–41)
Albumin: 2.5 g/dL — ABNORMAL LOW (ref 3.5–5.0)
BILIRUBIN TOTAL: 1.1 mg/dL (ref 0.3–1.2)
BUN: 40 mg/dL — ABNORMAL HIGH (ref 6–20)
CALCIUM: 7.9 mg/dL — AB (ref 8.9–10.3)
CO2: 33 mmol/L — ABNORMAL HIGH (ref 22–32)
Chloride: 96 mmol/L — ABNORMAL LOW (ref 101–111)
Creatinine, Ser: 4.84 mg/dL — ABNORMAL HIGH (ref 0.44–1.00)
GFR calc non Af Amer: 10 mL/min — ABNORMAL LOW (ref >60)
GFR, EST AFRICAN AMERICAN: 11 mL/min — AB (ref >60)
Glucose, Bld: 116 mg/dL — ABNORMAL HIGH (ref 65–99)
Potassium: 3.4 mmol/L — ABNORMAL LOW (ref 3.5–5.1)
Sodium: 142 mmol/L (ref 135–145)
TOTAL PROTEIN: 5.8 g/dL — AB (ref 6.5–8.1)

## 2015-08-02 LAB — GLUCOSE, CAPILLARY
GLUCOSE-CAPILLARY: 87 mg/dL (ref 65–99)
GLUCOSE-CAPILLARY: 126 mg/dL — AB (ref 65–99)
GLUCOSE-CAPILLARY: 142 mg/dL — AB (ref 65–99)

## 2015-08-02 LAB — CBC
HEMATOCRIT: 26.7 % — AB (ref 35.0–47.0)
HEMOGLOBIN: 8.5 g/dL — AB (ref 12.0–16.0)
MCH: 27.1 pg (ref 26.0–34.0)
MCHC: 31.7 g/dL — AB (ref 32.0–36.0)
MCV: 85.6 fL (ref 80.0–100.0)
Platelets: 266 10*3/uL (ref 150–440)
RBC: 3.12 MIL/uL — ABNORMAL LOW (ref 3.80–5.20)
RDW: 14.8 % — AB (ref 11.5–14.5)
WBC: 15.8 10*3/uL — ABNORMAL HIGH (ref 3.6–11.0)

## 2015-08-02 LAB — PROCALCITONIN: Procalcitonin: 1.15 ng/mL

## 2015-08-02 LAB — MAGNESIUM: Magnesium: 1.7 mg/dL (ref 1.7–2.4)

## 2015-08-02 MED ORDER — POTASSIUM CHLORIDE 20 MEQ/15ML (10%) PO SOLN
10.0000 meq | Freq: Once | ORAL | Status: AC
  Administered 2015-08-02: 10 meq via ORAL
  Filled 2015-08-02: qty 7.5

## 2015-08-02 MED ORDER — METOPROLOL TARTRATE 25 MG PO TABS
25.0000 mg | ORAL_TABLET | Freq: Two times a day (BID) | ORAL | Status: DC
  Administered 2015-08-02 – 2015-08-05 (×7): 25 mg via ORAL
  Filled 2015-08-02 (×7): qty 1

## 2015-08-02 NOTE — Progress Notes (Signed)
[facility] - [place] [facility]   PATIENT NAME: Tiffany Gomez    MR#:  [id number]  DATE OF BIRTH:  1965-08-02  SUBJECTIVE:   No acute events overnight. Patient's urine output is adequate.  REVIEW OF SYSTEMS:    Review of Systems  Constitutional: Negative for fever, chills and malaise/fatigue.  HENT: Negative for ear discharge, ear pain, hearing loss, nosebleeds and sore throat.   Eyes: Negative for blurred vision and pain.  Respiratory: Negative for cough, hemoptysis, shortness of breath and wheezing.   Cardiovascular: Negative for chest pain, palpitations and leg swelling.  Gastrointestinal: Negative for nausea, vomiting, abdominal pain, diarrhea and blood in stool.  Genitourinary: Negative for dysuria, urgency, frequency, hematuria and flank pain.  Musculoskeletal: Negative for back pain.  Neurological: Positive for weakness. Negative for dizziness, tremors, speech change, focal weakness, seizures and headaches.  Endo/Heme/Allergies: Does not bruise/bleed easily.  Psychiatric/Behavioral: Negative for depression, suicidal ideas and hallucinations.    Tolerating Diet:yes      DRUG ALLERGIES:  No Known Allergies  VITALS:  Blood pressure 148/94, pulse 93, temperature 97.7 F (36.5 C), temperature source Oral, resp. rate 18, height  (1.676 m), weight 180.078 kg (397 lb), SpO2 99 %.  PHYSICAL EXAMINATION:   Physical Exam  Constitutional: She is oriented to person, place, and time and well-developed, well-nourished, and in no distress. No distress.  Morbidly obese  HENT:  Head: Normocephalic.  Eyes: No scleral icterus.  Neck: Normal range of motion. Neck supple. No JVD present. No tracheal deviation present.  Cardiovascular: Normal rate, regular rhythm and normal heart sounds.  Exam reveals no gallop and no friction rub.   No murmur heard. Pulmonary/Chest: Effort normal and breath sounds normal. No respiratory distress. She has no wheezes. She  has no rales. She exhibits no tenderness.  Abdominal: Soft. Bowel sounds are normal. She exhibits no distension and no mass. There is no tenderness. There is no rebound and no guarding.  Musculoskeletal: Normal range of motion. She exhibits edema.  Neurological: She is alert and oriented to person, place, and time.  Skin: Skin is warm. No rash noted. No erythema.  Psychiatric: Affect and judgment normal.      LABORATORY PANEL:   CBC  Recent Labs Lab 08/02/15 0424  WBC 15.8*  HGB 8.5*  HCT 26.7*  PLT 266   ------------------------------------------------------------------------------------------------------------------  Chemistries   Recent Labs Lab 07/30/15 0335  08/02/15 0424  NA 137  < > 142  K 3.6  < > 3.4*  CL 101  < > 96*  CO2 26  < > 33*  GLUCOSE 204*  < > 116*  BUN 27*  < > 40*  CREATININE 3.23*  < > 4.84*  CALCIUM 6.7*  < > 7.9*  MG 1.5*  --   --   AST  --   < > 184*  ALT  --   < > 72*  ALKPHOS  --   < > 100  BILITOT  --   < > 1.1  < > = values in this interval not displayed. ------------------------------------------------------------------------------------------------------------------  Cardiac Enzymes  Recent Labs Lab 07/29/15 2244 07/30/15 0209 07/30/15 0814  TROPONINI 0.18* 0.22* 0.39*   ------------------------------------------------------------------------------------------------------------------  RADIOLOGY:  Dg Chest 1 View  08/01/2015  CLINICAL DATA:  Dyspnea EXAM: CHEST 1 VIEW COMPARISON:  07/31/2015 FINDINGS: Hypoventilatory radiograph. Stable right central line. Other support devices have been removed. Stable cardiac enlargement. There is vascular congestion and the bilateral perihilar vessels are indistinct. Stable lower  lobe atelectasis. IMPRESSION: Stable bilateral atelectasis. Findings also concerning for mild pulmonary edema. Electronically Signed   By: Esperanza Heiraymond  Rubner M.D.   On: 08/01/2015 08:08     ASSESSMENT AND PLAN:    50 year old female with a history of diabetes and morbid obesity who presented with septic shock with hyper Acute respiratory failure, acute renal failure and rhabdomyolysis  1. Acute hypercapnic respiratory failure with history of OSA: Patient is  extubated and doing well. She needs outpatient sleep study reevaluation. Start CPAP at night while in hospital.   2. Acute renal failure: This is due to ATN from hypotension and medications including metformin, NSAIDS and ACEI. Initially patient had oliguric renal failure and severe hyperkalemia and required emergent dialysis. No dialysis for today.  Patient with good urine output. Follow creatinine. Appreciate renal consult.  3. Rhabdomyolysis: Improved.  4. Possible sepsis: Sepsis has been ruled out.  5. Diabetes: Continue TRADJENTA and send scale insulin with ADA diet.  Management plans discussed with the patient and she is in agreement.  CODE STATUS: FULL  TOTAL TIME TAKING CARE OF THIS PATIENT:  24 minutes.    POSSIBLE D/C 1-2 days, DEPENDING ON CLINICAL CONDITION.   Jazzmine Kleiman M.D on 08/02/2015 at 10:23 AM  Between 7am to 6pm - Pager - [phone] After 6pm go to [email or web address] - Social research officer, governmentpassword EPAS [facility]  [facility] [place] [facility]  Office  [phone]  CC: Primary care physician; WHITE, Valentina ShaggyHRISTINA M, FNP  Note: This dictation was prepared with Dragon dictation along with smaller phrase technology. Any transcriptional errors that result from this process are unintentional.

## 2015-08-02 NOTE — Progress Notes (Signed)
Notified Dr[facility] Juliene PinaMody [facility] and increased heartrate. Orders to be placed by MD.

## 2015-08-02 NOTE — Evaluation (Signed)
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   Clinical Impression  50 yo Female admitted with septic shock and rhabdomyolysis. Patient was independent in all ADLs prior to admittance and was driving. Currently she is min A for bed mobility, CGA for sit<>Stand transfer. She is able to take 2-3 steps to bedside chair with supervision with cues for walker placement and to improve foot clearance. Patient ambulates with short shuffled steps, decreased weight shift, wide base of support and slow gait speed. She demonstrates increased weakness throughout BLE. Weakness combined with obesity significantly limit her mobility. She would benefit from additional skilled PT intervention to improve LE strength, balance and gait safety. Limited caregiver support and help at home would be a significant barrier for discharge home.     Follow Up Recommendations SNF    Equipment Recommendations   (to be determined post acute; )    Recommendations for Other Services Rehab consult     Precautions / Restrictions Precautions Precautions: Fall Restrictions Weight Bearing Restrictions: No      Mobility  Bed Mobility Overal bed mobility: Needs Assistance Bed Mobility: Supine to Sit     Supine to sit: Min assist;HOB elevated     General bed mobility comments: with bed rails; able to initiate LE movement well, but needed min A to come into full sitting;   Transfers Overall transfer level: Needs assistance Equipment used: Rolling walker (2 wheeled) Transfers: Sit to/from Stand Sit to Stand: Min  guard         General transfer comment: sit<>Stand from bed, CGA with RW with min Vcs for hand placement and to increase erect posture once standing;   Ambulation/Gait Ambulation/Gait assistance: Supervision Ambulation Distance (Feet): 3 Feet Assistive device: Rolling walker (2 wheeled) Gait Pattern/deviations: Step-to pattern;Decreased step length - right;Decreased step length - left;Decreased stride length;Shuffle;Wide base of support;Trunk flexed Gait velocity: very slow;    General Gait Details: slower gait speed; decreased foot clearance;   Stairs            Wheelchair Mobility    Modified Rankin (Stroke Patients Only)       Balance Overall balance assessment: Needs assistance Sitting-balance support: Bilateral upper extremity supported Sitting balance-Leahy Scale: Fair Sitting balance - Comments: able to demonstrate erect posture, but needs 2 HHA on bed for steady;    Standing balance support: Bilateral upper extremity supported Standing balance-Leahy Scale: Fair Standing balance comment: able to stand with CGA-close supervision with RW;                              Pertinent Vitals/Pain Pain Assessment: No/denies pain    Home Living Family/patient expects to be discharged to:: Private residence Living Arrangements: Alone Available Help at Discharge:  (would have no one at home once discharged; ) Type of Home: Apartment Home Access: Level entry     Home Layout: One level Home Equipment: Cane - single point      Prior Function Level of Independence: Independent         Comments: was using SPC sometimes at home; Patient was driving and independent in  all self care tasks prior to admittance;      Hand Dominance   Dominant Hand: Right    Extremity/Trunk Assessment   Upper Extremity Assessment: Defer to OT evaluation           Lower Extremity Assessment: Generalized weakness;RLE deficits/detail;LLE deficits/detail RLE  Deficits / Details: hip grossly 3/5, knee 3+/5, ankle 3/5; intact light touch sensation;  LLE Deficits / Details: hip grossly 3/5, knee 3+/5, ankle 3/5; intact light touch sensation;   Cervical / Trunk Assessment: Normal  Communication   Communication: No difficulties  Cognition Arousal/Alertness: Lethargic Behavior During Therapy: WFL for tasks assessed/performed Overall Cognitive Status: Within Functional Limits for tasks assessed                      General Comments General comments (skin integrity, edema, etc.): increased swelling in BLE; no skin breakdown noted;     Exercises Other Exercises Other Exercises: Instructed patient in seated BLE strengthening (ankle pumps, LAQ, hip flexion march) x5 each LE with cues to increase ROM for increased strengthening; Also instructed patient to perform exercise every hour while sitting to increase circulation;       Assessment/Plan    PT Assessment Patient needs continued PT services  PT Diagnosis Difficulty walking;Generalized weakness   PT Problem List Decreased strength;Decreased activity tolerance;Decreased balance;Decreased mobility;Decreased knowledge of use of DME;Obesity  PT Treatment Interventions DME instruction;Gait training;Functional mobility training;Therapeutic activities;Therapeutic exercise;Patient/family education;Balance training   PT Goals (Current goals can be found in the Care Plan section) Acute Rehab PT Goals Patient Stated Goal: "I want to be able to get stronger so that I can go home." PT Goal Formulation: With patient Time For Goal Achievement: 08/16/15 Potential to Achieve Goals: Fair    Frequency Min 2X/week   Barriers to discharge Decreased caregiver support lives alone and does not have assistance once discharged;     Co-evaluation               End of Session   Activity Tolerance: Patient tolerated treatment well Patient left: in chair;with call bell/phone within reach;with chair  alarm set Nurse Communication: Mobility status         Time: [id number] PT Time Calculation (min) (ACUTE ONLY): 30 min   Charges:   PT Evaluation $PT Eval Moderate Complexity: 1 Procedure     PT G Codes:        Trotter,Margaret PT, DPT 08/02/2015, 12:09 PM

## 2015-08-02 NOTE — Progress Notes (Signed)
Subjective:   Patient Is feeling better.  Sitting up in chair. C/o left thigh pain Urine output has improved to >2000 cc Serum creatinine however is higher at 4.84 from 4.64 Potassium is low at 3.4      Objective:  Vital signs in last 24 hours:  Temp:  [97.6 F (36.4 C)-98.4 F (36.9 C)] 98.4 F (36.9 C) (04/30 1327) Pulse Rate:  [93-119] 118 (04/30 1329) Resp:  [16-22] 16 (04/30 1327) BP: (114-148)/(70-112) 146/70 mmHg (04/30 1329) SpO2:  [98 %-100 %] 100 % (04/30 1329)  Weight change:  Filed Weights   07/30/15 0554 07/31/15 0500 08/01/15 0500  Weight: 183.253 kg (404 lb) 183.253 kg (404 lb) 180.078 kg (397 lb)    Intake/Output:    Intake/Output Summary (Last 24 hours) at 08/02/15 1434 Last data filed at 08/02/15 0827  Gross per 24 hour  Intake    120 ml  Output   1850 ml  Net  -1730 ml     Physical Exam: General:  sitting up in chair, NAD  HEENT  Anicteric, moist oral mucous membranes   Neck  no masses  Pulm/lungs  coarse breath sounds,   CVS/Heart  regular, tachycardic   Abdomen:  Soft, NT, non distended, obese  Extremities: + dependent edema  Neurologic: Alert, able to follow commands   Skin: No acute rashes  Access: Rt IJ vascath (4/26) Dr Dema Severin       Basic Metabolic Panel:   Recent Labs Lab 07/29/15 2244 07/30/15 0335 07/31/15 0500 08/01/15 0534 08/02/15 0424  NA 135 137 138 142 142  K 4.9 3.6 3.4* 3.2* 3.4*  CL 105 101 95* 94* 96*  CO2 21* 26 30 32 33*  GLUCOSE 265* 204* 181* 137* 116*  BUN 38* 27* 28* 34* 40*  CREATININE 4.66*  4.57* 3.23* 3.76* 4.64* 4.84*  CALCIUM 6.4* 6.7* 7.0* 7.6* 7.9*  MG 1.6* 1.5*  --   --   --   PHOS 6.6* 4.8*  --  4.5  --      CBC:  Recent Labs Lab 07/29/15 2244 07/30/15 0335 07/31/15 0500 08/01/15 0544 08/02/15 0424  WBC 22.4* 20.1* 15.6* 15.9* 15.8*  HGB 10.4* 10.3* 8.0* 7.6* 8.5*  HCT 33.0* 32.6* 24.7* 23.3* 26.7*  MCV 86.6 85.0 84.5 83.2 85.6  PLT 376 332 255 234 266       Microbiology:  Recent Results (from the past 720 hour(s))  Culture, blood (routine x 2)     Status: None (Preliminary result)   Collection Time: 07/29/15  5:13 PM  Result Value Ref Range Status   Specimen Description BLOOD RIGHT FATTY CASTS  Final   Special Requests BOTTLES DRAWN AEROBIC AND ANAEROBIC  1CC  Final   Culture  Setup Time   Final    GRAM POSITIVE COCCI ANAEROBIC BOTTLE ONLY CRITICAL RESULT CALLED TO, READ BACK BY AND VERIFIED WITH: JASON ROBBINS AT 2110 07/30/15 KLK.    Culture   Final    COAGULASE NEGATIVE STAPHYLOCOCCUS ANAEROBIC BOTTLE ONLY THE SIGNIFICANCE OF ISOLATING THIS ORGANISM FROM A SINGLE VENIPUNCTURE CANNOT BE PREDICTED WITHOUT FURTHER CLINICAL AND CULTURE CORRELATION. SUSCEPTIBILITIES AVAILABLE ONLY ON REQUEST.    Report Status PENDING  Incomplete  Urine culture     Status: None   Collection Time: 07/29/15  5:13 PM  Result Value Ref Range Status   Specimen Description URINE, CATHETERIZED  Final   Special Requests NONE  Final   Culture NO GROWTH 2 DAYS  Final   Report Status 07/31/2015  FINAL  Final  Blood Culture ID Panel (Reflexed)     Status: Abnormal   Collection Time: 07/29/15  5:13 PM  Result Value Ref Range Status   Enterococcus species NOT DETECTED NOT DETECTED Final   Vancomycin resistance NOT DETECTED NOT DETECTED Final   Listeria monocytogenes NOT DETECTED NOT DETECTED Final   Staphylococcus species DETECTED (A) NOT DETECTED Final    Comment: CRITICAL RESULT CALLED TO, READ BACK BY AND VERIFIED WITH: JASON ROBBINS AT 2110 07/30/15 KLK.    Staphylococcus aureus NOT DETECTED NOT DETECTED Final   Methicillin resistance NOT DETECTED NOT DETECTED Final   Streptococcus species NOT DETECTED NOT DETECTED Final   Streptococcus agalactiae NOT DETECTED NOT DETECTED Final   Streptococcus pneumoniae NOT DETECTED NOT DETECTED Final   Streptococcus pyogenes NOT DETECTED NOT DETECTED Final   Acinetobacter baumannii NOT DETECTED NOT DETECTED Final    Enterobacteriaceae species NOT DETECTED NOT DETECTED Final   Enterobacter cloacae complex NOT DETECTED NOT DETECTED Final   Escherichia coli NOT DETECTED NOT DETECTED Final   Klebsiella oxytoca NOT DETECTED NOT DETECTED Final   Klebsiella pneumoniae NOT DETECTED NOT DETECTED Final   Proteus species NOT DETECTED NOT DETECTED Final   Serratia marcescens NOT DETECTED NOT DETECTED Final   Carbapenem resistance NOT DETECTED NOT DETECTED Final   Haemophilus influenzae NOT DETECTED NOT DETECTED Final   Neisseria meningitidis NOT DETECTED NOT DETECTED Final   Pseudomonas aeruginosa NOT DETECTED NOT DETECTED Final   Candida albicans NOT DETECTED NOT DETECTED Final   Candida glabrata NOT DETECTED NOT DETECTED Final   Candida krusei NOT DETECTED NOT DETECTED Final   Candida parapsilosis NOT DETECTED NOT DETECTED Final   Candida tropicalis NOT DETECTED NOT DETECTED Final  Culture, blood (routine x 2)     Status: None (Preliminary result)   Collection Time: 07/29/15  5:20 PM  Result Value Ref Range Status   Specimen Description BLOOD LEFT ASSIST CONTROL  Final   Special Requests BOTTLES DRAWN AEROBIC AND ANAEROBIC 1CC  Final   Culture NO GROWTH 4 DAYS  Final   Report Status PENDING  Incomplete  MRSA PCR Screening     Status: None   Collection Time: 07/29/15 10:42 PM  Result Value Ref Range Status   MRSA by PCR NEGATIVE NEGATIVE Final    Comment:        The GeneXpert MRSA Assay (FDA approved for NASAL specimens only), is one component of a comprehensive MRSA colonization surveillance program. It is not intended to diagnose MRSA infection nor to guide or monitor treatment for MRSA infections.     Coagulation Studies: No results for input(s): LABPROT, INR in the last 72 hours.  Urinalysis: No results for input(s): COLORURINE, LABSPEC, PHURINE, GLUCOSEU, HGBUR, BILIRUBINUR, KETONESUR, PROTEINUR, UROBILINOGEN, NITRITE, LEUKOCYTESUR in the last 72 hours.  Invalid input(s): APPERANCEUR     Imaging: Dg Chest 1 View  08/01/2015  CLINICAL DATA:  Dyspnea EXAM: CHEST 1 VIEW COMPARISON:  07/31/2015 FINDINGS: Hypoventilatory radiograph. Stable right central line. Other support devices have been removed. Stable cardiac enlargement. There is vascular congestion and the bilateral perihilar vessels are indistinct. Stable lower lobe atelectasis. IMPRESSION: Stable bilateral atelectasis. Findings also concerning for mild pulmonary edema. Electronically Signed   By: Esperanza Heir M.D.   On: 08/01/2015 08:08     Medications:     . clonazePAM  1 mg Oral QHS  . docusate sodium  100 mg Oral BID  . heparin subcutaneous  5,000 Units Subcutaneous Q8H  . insulin aspart  0-15 Units Subcutaneous TID WC  . linagliptin  5 mg Oral Daily  . piperacillin-tazobactam (ZOSYN)  IV  3.375 g Intravenous Q12H  . polyethylene glycol  17 g Oral Daily  . pravastatin  20 mg Oral QHS  . sodium chloride flush  10-40 mL Intracatheter Q12H  . venlafaxine  75 mg Oral BID WC  . ziprasidone  160 mg Oral Q supper   sodium chloride, acetaminophen, albuterol, alprazolam, ipratropium-albuterol, morphine injection, ondansetron (ZOFRAN) IV  Assessment/ Plan:  50 y.o. female with a PMHX of morbid obesity, DM, OSA, Depression, HTN, HLD, Gastric Bypass, Cervical disc fusion, chronic pain (lumbar radiculopathy), was admitted on 07/29/2015 after being found unresponsive at home  1. ARF - Likely ATN, Patient was hypotensive At the time of presentation. Baseline Cr 0.90 (06/16/2014) Other contributing factors include metformin, ACE-i, Ibuprofen, Rhabdomyolysis 2. Severe Hyperkerkalemia due to Rhabdomyolysis (CK > 9000->16000), now hypokalemia 3. Acute respiratory failure- intubated in ER 4/26- extubated 4/28 4. Severe Acidosis, Ph 7.10->7.33 5. Altered mental status- improved 6. Morbid obesity  Plan:  Urine output has improved to > 2000 cc  Electrolytes and Volume status are acceptable No acute indication for  Dialysis at present  Monitor UOP and electrolytes closely Replace potassium as necessary    LOS: 4 Tiffany Gomez 4/30/20172:34 PM

## 2015-08-03 ENCOUNTER — Inpatient Hospital Stay: Payer: Medicare Other

## 2015-08-03 LAB — GASTROINTESTINAL PANEL BY PCR, STOOL (REPLACES STOOL CULTURE)

## 2015-08-03 LAB — CULTURE, BLOOD (ROUTINE X 2)

## 2015-08-03 LAB — BASIC METABOLIC PANEL
Anion gap: 10 (ref 5–15)
BUN: 43 mg/dL — AB (ref 6–20)
CALCIUM: 7.8 mg/dL — AB (ref 8.9–10.3)
CO2: 33 mmol/L — ABNORMAL HIGH (ref 22–32)
CREATININE: 4.73 mg/dL — AB (ref 0.44–1.00)
Chloride: 96 mmol/L — ABNORMAL LOW (ref 101–111)
GFR, EST AFRICAN AMERICAN: 12 mL/min — AB (ref >60)
GFR, EST NON AFRICAN AMERICAN: 10 mL/min — AB (ref >60)
Glucose, Bld: 215 mg/dL — ABNORMAL HIGH (ref 65–99)
Potassium: 3.3 mmol/L — ABNORMAL LOW (ref 3.5–5.1)
SODIUM: 139 mmol/L (ref 135–145)

## 2015-08-03 LAB — CBC
HCT: 26 % — ABNORMAL LOW (ref 35.0–47.0)
Hemoglobin: 8.2 g/dL — ABNORMAL LOW (ref 12.0–16.0)
MCH: 26.7 pg (ref 26.0–34.0)
MCHC: 31.7 g/dL — AB (ref 32.0–36.0)
MCV: 84.2 fL (ref 80.0–100.0)
Platelets: 261 10*3/uL (ref 150–440)
RBC: 3.09 MIL/uL — AB (ref 3.80–5.20)
RDW: 14.8 % — AB (ref 11.5–14.5)
WBC: 15.3 10*3/uL — AB (ref 3.6–11.0)

## 2015-08-03 LAB — GLUCOSE, CAPILLARY
GLUCOSE-CAPILLARY: 152 mg/dL — AB (ref 65–99)
Glucose-Capillary: 135 mg/dL — ABNORMAL HIGH (ref 65–99)
Glucose-Capillary: 116 mg/dL — ABNORMAL HIGH (ref 65–99)
Glucose-Capillary: 182 mg/dL — ABNORMAL HIGH (ref 65–99)

## 2015-08-03 LAB — CK: CK TOTAL: 1112 U/L — AB (ref 38–234)

## 2015-08-03 MED ORDER — OXYCODONE-ACETAMINOPHEN 5-325 MG PO TABS
1.0000 | ORAL_TABLET | Freq: Four times a day (QID) | ORAL | Status: DC | PRN
  Administered 2015-08-03 – 2015-08-05 (×6): 1 via ORAL
  Filled 2015-08-03 (×6): qty 1

## 2015-08-03 MED ORDER — PIPERACILLIN-TAZOBACTAM 4.5 G IVPB
4.5000 g | Freq: Three times a day (TID) | INTRAVENOUS | Status: DC
  Administered 2015-08-03: 4.5 g via INTRAVENOUS
  Filled 2015-08-03 (×3): qty 100

## 2015-08-03 MED ORDER — PIPERACILLIN-TAZOBACTAM 3.375 G IVPB
3.3750 g | Freq: Three times a day (TID) | INTRAVENOUS | Status: DC
  Filled 2015-08-03 (×3): qty 50

## 2015-08-03 MED ORDER — SODIUM CHLORIDE 0.9 % IV SOLN
INTRAVENOUS | Status: DC
  Administered 2015-08-03 – 2015-08-05 (×4): via INTRAVENOUS

## 2015-08-03 NOTE — Progress Notes (Signed)
Paged on call MD about pt having 5 bt run of Vtach. No new orders at this time.

## 2015-08-03 NOTE — Consult Note (Signed)
Pharmacy Antibiotic Note  Tiffany Gomez is a 50 y.o. female admitted on 07/29/2015 with sepsis and ARF.  Pharmacy has been consulted for  zosyn dosing. Suspected source is GI.   Plan: Increased Zosyn to 3.375 gm IV Q8H EI for improved renal function, CrCl > 20 mL/min and per nephrology notes no acute indication for dialysis. Pharmacy will continue to monitor.    Height: 5\' 6"  (167.6 cm) Weight: (!) 383 lb (173.728 kg) IBW/kg (Calculated) : 59.3  Temp (24hrs), Avg:97.9 F (36.6 C), Min:97.6 F (36.4 C), Max:98.4 F (36.9 C)   Recent Labs Lab 07/29/15 2244 07/30/15 0335 07/31/15 0500 08/01/15 0534 08/01/15 0544 08/02/15 0424  WBC 22.4* 20.1* 15.6*  --  15.9* 15.8*  CREATININE 4.66*  4.57* 3.23* 3.76* 4.64*  --  4.84*  LATICACIDVEN 1.8  --   --   --   --   --     Estimated Creatinine Clearance: 23.8 mL/min (by C-G formula based on Cr of 4.84).    No Known Allergies  Antimicrobials this admission: 4/26 vancomycin >> 4/27 4/26 zosyn >>  4/26 metronidazole >> 4/27  Dose adjustments this admission:   Microbiology results: 4/26 BCx: CNS 1/2 4/26 UCx: negative 4/26 MRSA PCR: negative   Thank you for allowing pharmacy to be a part of this patient's care.  Rion Schnitzer A. [place]son, [place]D., BCPS Clinical Pharmacist  08/03/2015 3:22 AM

## 2015-08-03 NOTE — Progress Notes (Signed)
LE doppler negative for DVT Cont supportive care for leg pain and OOB/PT

## 2015-08-03 NOTE — Progress Notes (Signed)
Subjective:  Renal functiondid not significantly improved at this time. Creatinine currently 4.73. Good urine output noted.     Objective:  Vital signs in last 24 hours:  Temp:  [97.9 F (36.6 C)-98 F (36.7 C)] 98 F (36.7 C) (05/01 1530) Pulse Rate:  [79-97] 81 (05/01 1530) Resp:  [20] 20 (05/01 1530) BP: (117-149)/(68-84) 149/71 mmHg (05/01 1530) SpO2:  [98 %-100 %] 100 % (05/01 1530) Weight:  [173.728 kg (383 lb)] 173.728 kg (383 lb) (05/01 0500)  Weight change:  Filed Weights   08/01/15 0500 08/02/15 1716 08/03/15 0500  Weight: 180.078 kg (397 lb) 173.728 kg (383 lb) 173.728 kg (383 lb)    Intake/Output:    Intake/Output Summary (Last 24 hours) at 08/03/15 1613 Last data filed at 08/03/15 1501  Gross per 24 hour  Intake    770 ml  Output    950 ml  Net   -180 ml     Physical Exam: General:  sitting up in chair, NAD  HEENT  Anicteric, moist oral mucous membranes   Neck  supple  Pulm/lungs  coarse breath sounds, normal effort  CVS/Heart  regular, no rubs  Abdomen:  Soft, NT, non distended, obese  Extremities: + dependent edema  Neurologic: Alert, able to follow commands   Skin: No acute rashes  Access: Rt IJ vascath (4/26) Dr Dema Severin       Basic Metabolic Panel:   Recent Labs Lab 07/29/15 2244 07/30/15 0335 07/31/15 0500 08/01/15 0534 08/02/15 0424 08/02/15 1616 08/03/15 0914  NA 135 137 138 142 142  --  139  K 4.9 3.6 3.4* 3.2* 3.4*  --  3.3*  CL 105 101 95* 94* 96*  --  96*  CO2 21* 26 30 32 33*  --  33*  GLUCOSE 265* 204* 181* 137* 116*  --  215*  BUN 38* 27* 28* 34* 40*  --  43*  CREATININE 4.66*  4.57* 3.23* 3.76* 4.64* 4.84*  --  4.73*  CALCIUM 6.4* 6.7* 7.0* 7.6* 7.9*  --  7.8*  MG 1.6* 1.5*  --   --   --  1.7  --   PHOS 6.6* 4.8*  --  4.5  --   --   --      CBC:  Recent Labs Lab 07/30/15 0335 07/31/15 0500 08/01/15 0544 08/02/15 0424 08/03/15 0914  WBC 20.1* 15.6* 15.9* 15.8* 15.3*  HGB 10.3* 8.0* 7.6* 8.5* 8.2*  HCT  32.6* 24.7* 23.3* 26.7* 26.0*  MCV 85.0 84.5 83.2 85.6 84.2  PLT 332 255 234 266 261      Microbiology:  Recent Results (from the past 720 hour(s))  Culture, blood (routine x 2)     Status: None   Collection Time: 07/29/15  5:13 PM  Result Value Ref Range Status   Specimen Description BLOOD RIGHT FATTY CASTS  Final   Special Requests BOTTLES DRAWN AEROBIC AND ANAEROBIC  1CC  Final   Culture  Setup Time   Final    GRAM POSITIVE COCCI ANAEROBIC BOTTLE ONLY CRITICAL RESULT CALLED TO, READ BACK BY AND VERIFIED WITH: JASON ROBBINS AT 2110 07/30/15 KLK.    Culture   Final    COAGULASE NEGATIVE STAPHYLOCOCCUS ANAEROBIC BOTTLE ONLY THE SIGNIFICANCE OF ISOLATING THIS ORGANISM FROM A SINGLE VENIPUNCTURE CANNOT BE PREDICTED WITHOUT FURTHER CLINICAL AND CULTURE CORRELATION. SUSCEPTIBILITIES AVAILABLE ONLY ON REQUEST.    Report Status 08/03/2015 FINAL  Final  Urine culture     Status: None   Collection Time: 07/29/15  5:13 PM  Result Value Ref Range Status   Specimen Description URINE, CATHETERIZED  Final   Special Requests NONE  Final   Culture NO GROWTH 2 DAYS  Final   Report Status 07/31/2015 FINAL  Final  Blood Culture ID Panel (Reflexed)     Status: Abnormal   Collection Time: 07/29/15  5:13 PM  Result Value Ref Range Status   Enterococcus species NOT DETECTED NOT DETECTED Final   Vancomycin resistance NOT DETECTED NOT DETECTED Final   Listeria monocytogenes NOT DETECTED NOT DETECTED Final   Staphylococcus species DETECTED (A) NOT DETECTED Final    Comment: CRITICAL RESULT CALLED TO, READ BACK BY AND VERIFIED WITH: JASON ROBBINS AT 2110 07/30/15 KLK.    Staphylococcus aureus NOT DETECTED NOT DETECTED Final   Methicillin resistance NOT DETECTED NOT DETECTED Final   Streptococcus species NOT DETECTED NOT DETECTED Final   Streptococcus agalactiae NOT DETECTED NOT DETECTED Final   Streptococcus pneumoniae NOT DETECTED NOT DETECTED Final   Streptococcus pyogenes NOT DETECTED NOT  DETECTED Final   Acinetobacter baumannii NOT DETECTED NOT DETECTED Final   Enterobacteriaceae species NOT DETECTED NOT DETECTED Final   Enterobacter cloacae complex NOT DETECTED NOT DETECTED Final   Escherichia coli NOT DETECTED NOT DETECTED Final   Klebsiella oxytoca NOT DETECTED NOT DETECTED Final   Klebsiella pneumoniae NOT DETECTED NOT DETECTED Final   Proteus species NOT DETECTED NOT DETECTED Final   Serratia marcescens NOT DETECTED NOT DETECTED Final   Carbapenem resistance NOT DETECTED NOT DETECTED Final   Haemophilus influenzae NOT DETECTED NOT DETECTED Final   Neisseria meningitidis NOT DETECTED NOT DETECTED Final   Pseudomonas aeruginosa NOT DETECTED NOT DETECTED Final   Candida albicans NOT DETECTED NOT DETECTED Final   Candida glabrata NOT DETECTED NOT DETECTED Final   Candida krusei NOT DETECTED NOT DETECTED Final   Candida parapsilosis NOT DETECTED NOT DETECTED Final   Candida tropicalis NOT DETECTED NOT DETECTED Final  Culture, blood (routine x 2)     Status: None (Preliminary result)   Collection Time: 07/29/15  5:20 PM  Result Value Ref Range Status   Specimen Description BLOOD LEFT ASSIST CONTROL  Final   Special Requests BOTTLES DRAWN AEROBIC AND ANAEROBIC 1CC  Final   Culture NO GROWTH 4 DAYS  Final   Report Status PENDING  Incomplete  MRSA PCR Screening     Status: None   Collection Time: 07/29/15 10:42 PM  Result Value Ref Range Status   MRSA by PCR NEGATIVE NEGATIVE Final    Comment:        The GeneXpert MRSA Assay (FDA approved for NASAL specimens only), is one component of a comprehensive MRSA colonization surveillance program. It is not intended to diagnose MRSA infection nor to guide or monitor treatment for MRSA infections.   Gastrointestinal Panel by PCR , Stool     Status: None   Collection Time: 08/03/15  1:00 PM  Result Value Ref Range Status   Campylobacter species NOT DETECTED NOT DETECTED Final   Plesimonas shigelloides NOT DETECTED NOT  DETECTED Final   Salmonella species NOT DETECTED NOT DETECTED Final   Yersinia enterocolitica NOT DETECTED NOT DETECTED Final   Vibrio species NOT DETECTED NOT DETECTED Final   Vibrio cholerae NOT DETECTED NOT DETECTED Final   Enteroaggregative E coli (EAEC) NOT DETECTED NOT DETECTED Final   Enteropathogenic E coli (EPEC) NOT DETECTED NOT DETECTED Final   Enterotoxigenic E coli (ETEC) NOT DETECTED NOT DETECTED Final   Shiga like toxin producing E  coli (STEC) NOT DETECTED NOT DETECTED Final   E. coli O157 NOT DETECTED NOT DETECTED Final   Shigella/Enteroinvasive E coli (EIEC) NOT DETECTED NOT DETECTED Final   Cryptosporidium NOT DETECTED NOT DETECTED Final   Cyclospora cayetanensis NOT DETECTED NOT DETECTED Final   Entamoeba histolytica NOT DETECTED NOT DETECTED Final   Giardia lamblia NOT DETECTED NOT DETECTED Final   Adenovirus F40/41 NOT DETECTED NOT DETECTED Final   Astrovirus NOT DETECTED NOT DETECTED Final   Norovirus GI/GII NOT DETECTED NOT DETECTED Final   Rotavirus A NOT DETECTED NOT DETECTED Final   Sapovirus (I, II, IV, and V) NOT DETECTED NOT DETECTED Final    Coagulation Studies: No results for input(s): LABPROT, INR in the last 72 hours.  Urinalysis: No results for input(s): COLORURINE, LABSPEC, PHURINE, GLUCOSEU, HGBUR, BILIRUBINUR, KETONESUR, PROTEINUR, UROBILINOGEN, NITRITE, LEUKOCYTESUR in the last 72 hours.  Invalid input(s): APPERANCEUR    Imaging: US Venous Img Lower Unilateral Left  08/03/2015  CLINICAL DATA:  Left lower extremity edema and pain. EXAM: LEFT LOWER EXTREMITY VENOUS DOPPLER ULTRASOUND TECHNIQUE: Gray-scale sonography with graded compression, as well as color Doppler and duplex ultrasound were performed to evaluate the lower extremity deep venous systems from the level of the common femoral vein and including the common femoral, femoral, profunda femoral, popliteal and calf veins including the posterior tibial, peroneal and gastrocnemius veins when  visible. The superficial great saphenous vein was also interrogated. Spectral Doppler was utilized to evaluate flow at rest and with distal augmentation maneuvers in the common femoral, femoral and popliteal veins. COMPARISON:  Previous bilateral lower extremity venous duplex study on 07/13/2009 FINDINGS: Contralateral Common Femoral Vein: Respiratory phasicity is normal and symmetric with the symptomatic side. No evidence of thrombus. Normal compressibility. Common Femoral Vein: No evidence of thrombus. Normal compressibility, respiratory phasicity and response to augmentation. Saphenofemoral Junction: No evidence of thrombus. Normal compressibility and flow on color Doppler imaging. Profunda Femoral Vein: No evidence of thrombus. Normal compressibility and flow on color Doppler imaging. Femoral Vein: No evidence of thrombus. Normal compressibility, respiratory phasicity and response to augmentation. Popliteal Vein: No evidence of thrombus. Normal compressibility, respiratory phasicity and response to augmentation. Calf Veins: No evidence of thrombus. Normal compressibility and flow on color Doppler imaging. Superficial Great Saphenous Vein: No evidence of thrombus. Normal compressibility and flow on color Doppler imaging. Venous Reflux:  None. Other Findings: No evidence of superficial thrombophlebitis or abnormal fluid collection. IMPRESSION: No evidence of left lower extremity deep venous thrombosis. Electronically Signed   By: Irish Lack M.D.   On: 08/03/2015 15:40     Medications:     . clonazePAM  1 mg Oral QHS  . docusate sodium  100 mg Oral BID  . heparin subcutaneous  5,000 Units Subcutaneous Q8H  . insulin aspart  0-15 Units Subcutaneous TID WC  . linagliptin  5 mg Oral Daily  . metoprolol tartrate  25 mg Oral BID  . polyethylene glycol  17 g Oral Daily  . pravastatin  20 mg Oral QHS  . sodium chloride flush  10-40 mL Intracatheter Q12H  . venlafaxine  75 mg Oral BID WC  .  ziprasidone  160 mg Oral Q supper   sodium chloride, acetaminophen, albuterol, alprazolam, ipratropium-albuterol, ondansetron (ZOFRAN) IV  Assessment/ Plan:  50 y.o. female with a PMHX of morbid obesity, DM, OSA, Depression, HTN, HLD, Gastric Bypass, Cervical disc fusion, chronic pain (lumbar radiculopathy), was admitted on 07/29/2015 after being found unresponsive at home  1. ARF - Likely  ATN, Patient was hypotensive At the time of presentation. Baseline Cr 0.90 (06/16/2014) Other contributing factors include metformin, ACE-i, Ibuprofen, Rhabdomyolysis 2. Severe Hyperkerkalemia due to Rhabdomyolysis (CK > 9000->16000), now hypokalemia 3. Acute respiratory failure- intubated in ER 4/26- extubated 4/28 4. Severe Acidosis, Ph 7.10->7.33 5. Altered mental status- improved 6. Morbid obesity  Plan:  Over the preceding 24 hours the patient hadalmost 1 L of urine output. Creatinine currently 4.7.  It has been several days since the last creatine kinase was checked.  We will recheck this today. We will also start the patient on 0.9 normal saline at 65 cc per hour. [facility] has good urine output. Continue to monitor her progress closely.     LOS: 5 Justino Boze 5/1/20174:13 PM

## 2015-08-03 NOTE — [place] FL2 (Signed)
[place] MEDICAID FL2 LEVEL OF CARE SCREENING TOOL     IDENTIFICATION  Patient Name: Tiffany Gomez Birthdate: [birth year] Sex: female Admission Date (Current Location): 07/29/2015  [place] and [place] Number:  Chiropodist and Address:  [facility], [address], [place], [place] [address]      Provider Number: [id number]  Attending Physician Name and Address:  Adrian Saran, MD  Relative Name and Phone Number:       Current Level of Care: Hospital Recommended Level of Care: Skilled Nursing Facility Prior Approval Number:    Date Approved/Denied:   PASRR Number:  [id number])  Discharge Plan: SNF    Current Diagnoses: Patient Active Problem List   Diagnosis Date Noted  . Rhabdomyolysis 07/29/2015  . Acute renal failure (HCC)   . Hyperkalemia   . Arterial hypotension   . Respiratory failure (HCC)   . f 09/02/2014  . Lumbar radiculitis 09/02/2014  . Lumbar radiculopathy, acute 09/02/2014  . Facet syndrome, lumbar 09/02/2014  . DDD (degenerative disc disease), cervical 09/01/2014  . HNP (herniated nucleus pulposus) with myelopathy, cervical 06/16/2014  . Morbid obesity (HCC) 06/16/2014  . Cervical herniated disc 06/16/2014  . Pain in the chest 06/01/2014  . Essential hypertension   . Hyperlipidemia     Orientation RESPIRATION BLADDER Height & Weight     Self, Time, Situation, Place  O2 (3.5 Liters of Oxygen (c-pap needed at night patinet's c-pap is broke has sleep study scheduled on 08/12/15) ) Continent, Indwelling catheter Weight: (!) 383 lb (173.728 kg) Height:   (167.6 cm)  BEHAVIORAL SYMPTOMS/MOOD NEUROLOGICAL BOWEL NUTRITION STATUS   (none )  (none ) Continent Diet (Diet: Heart Healthy/ Carb Mofidied )  AMBULATORY STATUS COMMUNICATION OF NEEDS Skin   Extensive Assist Verbally Normal                       Personal Care Assistance Level of Assistance  Bathing, Feeding, Dressing Bathing Assistance: Maximum  assistance Feeding assistance: Limited assistance (Can feed herself (set up tray in reaching distance for patient) ) Dressing Assistance: Maximum assistance     Functional Limitations Info  Sight, Hearing, Speech Sight Info: Adequate Hearing Info: Adequate Speech Info: Adequate    SPECIAL CARE FACTORS FREQUENCY  PT (By licensed PT), OT (By licensed OT)     PT Frequency:  (5) OT Frequency:  (5)            Contractures      Additional Factors Info  Code Status, Allergies, Psychotropic, Insulin Sliding Scale Code Status Info:  (Full Code. ) Allergies Info:  (No Known Allergies. ) Psychotropic Info:  (Xanax PRN ) Insulin Sliding Scale Info:  (NovoLog Insulin Injections 3 times daily. )       Current Medications (08/03/2015):  This is the current hospital active medication list Current Facility-Administered Medications  Medication Dose Route Frequency Provider Last Rate Last Dose  . 0.9 %  sodium chloride infusion  250 mL Intravenous PRN Vishal Mungal, MD 20 mL/hr at 08/03/15 0527 250 mL at 08/03/15 0527  . acetaminophen (TYLENOL) tablet 650 mg  650 mg Oral Q4H PRN Vishal Mungal, MD      . albuterol (PROVENTIL) (2.5 MG/3ML) 0.083% nebulizer solution 2.5 mg  2.5 mg Nebulization Q6H PRN Sherry Ruffing, RPH      . ALPRAZolam (XANAX) tablet 2 mg  2 mg Oral QID PRN Adrian Saran, MD      . clonazePAM (KLONOPIN) tablet  1 mg  1 mg Oral QHS Adrian SaranSital Mody, MD   1 mg at 08/02/15 2214  . docusate sodium (COLACE) capsule 100 mg  100 mg Oral BID Adrian SaranSital Mody, MD   100 mg at 08/03/15 0859  . heparin injection 5,000 Units  5,000 Units Subcutaneous Q8H Shane CrutchPradeep Ramachandran, MD   5,000 Units at 08/03/15 0511  . insulin aspart (novoLOG) injection 0-15 Units  0-15 Units Subcutaneous TID WC Lewie LoronMagadalene S Tukov, NP   2 Units at 08/03/15 0902  . ipratropium-albuterol (DUONEB) 0.5-2.5 (3) MG/3ML nebulizer solution 3 mL  3 mL Nebulization Q4H PRN Merwyn Katosavid B Simonds, MD      . linagliptin (TRADJENTA) tablet 5  mg  5 mg Oral Daily Adrian SaranSital Mody, MD   5 mg at 08/03/15 0858  . metoprolol tartrate (LOPRESSOR) tablet 25 mg  25 mg Oral BID Adrian SaranSital Mody, MD   25 mg at 08/03/15 0858  . morphine 2 MG/ML injection 2 mg  2 mg Intravenous Q3H PRN Merwyn Katosavid B Simonds, MD      . ondansetron [facility](ZOFRAN) injection 4 mg  4 mg Intravenous Q6H PRN Vishal Mungal, MD      . piperacillin-tazobactam (ZOSYN) IVPB 3.375 g  3.375 g Intravenous Q8H Sital Mody, MD      . polyethylene glycol (MIRALAX / GLYCOLAX) packet 17 g  17 g Oral Daily Sital Mody, MD   17 g at 08/01/15 1124  . pravastatin (PRAVACHOL) tablet 20 mg  20 mg Oral QHS Adrian SaranSital Mody, MD   20 mg at 08/02/15 2214  . sodium chloride flush (NS) 0.9 % injection 10-40 mL  10-40 mL Intracatheter Q12H Vishal Mungal, MD   10 mL at 08/02/15 0824  . venlafaxine [facility](EFFEXOR) tablet 75 mg  75 mg Oral BID WC Adrian SaranSital Mody, MD   75 mg at 08/03/15 0858  . ziprasidone (GEODON) capsule 160 mg  160 mg Oral Q supper Adrian SaranSital Mody, MD   160 mg at 08/02/15 1717     Discharge Medications: Please see discharge summary for a list of discharge medications.  Relevant Imaging Results:  Relevant Lab Results:   Additional Information  (SSN: [id number] (patient weighs 383, needs C-pap at night however her C-pap is broke. )  Haig ProphetMorgan, Nura Cahoon G, [place]

## 2015-08-03 NOTE — Clinical Social Work Placement (Signed)
   CLINICAL SOCIAL WORK PLACEMENT  NOTE  Date:  08/03/2015  Patient Details  Name: Tiffany Gomez MRN: [id number] Date of Birth: 1965-07-17  Clinical Social Work is seeking post-discharge placement for this patient at the Skilled  Nursing Facility level of care (*CSW will initial, date and re-position this form in  chart as items are completed):  Yes   Patient/family provided with [facility] Clinical Social Work Department's list of facilities offering this level of care within the geographic area requested by the patient (or if unable, by the patient's family).  Yes   Patient/family informed of their freedom to choose among providers that offer the needed level of care, that participate in Medicare, Medicaid or managed care program needed by the patient, have an available bed and are willing to accept the patient.  Yes   Patient/family informed of [facility]'s ownership interest in [facility] and [facility], as well as of the fact that they are under no obligation to receive care at these facilities.  PASRR submitted to EDS on 08/03/15     PASRR number received on 08/03/15     Existing PASRR number confirmed on       FL2 transmitted to all facilities in geographic area requested by pt/family on 08/03/15     FL2 transmitted to all facilities within larger geographic area on       Patient informed that his/her managed care company has contracts with or will negotiate with certain facilities, including the following:        Yes   Patient/family informed of bed offers received.  Patient chooses bed at  [facility] )     Physician recommends and patient chooses bed at      Patient to be transferred to   on  .  Patient to be transferred to facility by       Patient family notified on   of transfer.  Name of family member notified:        PHYSICIAN       Additional Comment:    _______________________________________________ Haig ProphetMorgan, Gideon Burstein G,  LCSW 08/03/2015, 10:57 AM

## 2015-08-03 NOTE — Care Management Important Message (Signed)
Important Message  Patient Details  Name: Tiffany Gomez MRN: [id number] Date of Birth: 1965/06/14   Medicare Important Message Given:  Yes    Collie SiadAngela Hollyanne Schloesser, RN 08/03/2015, 8:34 AM

## 2015-08-03 NOTE — Progress Notes (Signed)
[facility] [place] [facility]   PATIENT NAME: Tiffany Gomez    MR#:  [id number]  DATE OF BIRTH:  [birth year]  SUBJECTIVE:   Patient c/o left leg pain PT is recommending skilled nursing facility.  REVIEW OF SYSTEMS:    Review of Systems  Constitutional: Negative for fever, chills and malaise/fatigue.  HENT: Negative for ear discharge, ear pain, hearing loss, nosebleeds and sore throat.   Eyes: Negative for blurred vision and pain.  Respiratory: Negative for cough, hemoptysis, shortness of breath and wheezing.   Cardiovascular: Positive for leg swelling. Negative for chest pain and palpitations.  Gastrointestinal: Negative for nausea, vomiting, abdominal pain, diarrhea and blood in stool.  Genitourinary: Negative for dysuria, urgency, frequency, hematuria and flank pain.  Musculoskeletal: Negative for back pain.       Left leg pain  Neurological: Positive for weakness. Negative for dizziness, tremors, speech change, focal weakness, seizures and headaches.  Endo/Heme/Allergies: Does not bruise/bleed easily.  Psychiatric/Behavioral: Negative for depression, suicidal ideas and hallucinations.    Tolerating Diet:yes      DRUG ALLERGIES:  No Known Allergies  VITALS:  Blood pressure 117/80, pulse 87, temperature 98 F (36.7 C), temperature source Axillary, resp. rate 20, height 5\' 6"  (1.676 m), weight 173.728 kg (383 lb), SpO2 100 %.  PHYSICAL EXAMINATION:   Physical Exam  Constitutional: She is oriented to person, place, and time and well-developed, well-nourished, and in no distress. No distress.  Morbidly obese  HENT:  Head: Normocephalic.  Eyes: No scleral icterus.  Neck: Normal range of motion. Neck supple. No JVD present. No tracheal deviation present.  Cardiovascular: Normal rate, regular rhythm and normal heart sounds.  Exam reveals no gallop and no friction rub.   No murmur heard. Pulmonary/Chest: Effort normal and breath sounds normal. No  respiratory distress. She has no wheezes. She has no rales. She exhibits no tenderness.  Abdominal: Soft. Bowel sounds are normal. She exhibits no distension and no mass. There is no tenderness. There is no rebound and no guarding.  Musculoskeletal: Normal range of motion. She exhibits edema.  Neurological: She is alert and oriented to person, place, and time.  Skin: Skin is warm. No rash noted. No erythema.  Psychiatric: Affect and judgment normal.      LABORATORY PANEL:   CBC  Recent Labs Lab 08/03/15 0914  WBC 15.3*  HGB 8.2*  HCT 26.0*  PLT 261   ------------------------------------------------------------------------------------------------------------------  Chemistries   Recent Labs Lab 08/02/15 0424 08/02/15 1616 08/03/15 0914  NA 142  --  139  K 3.4*  --  3.3*  CL 96*  --  96*  CO2 33*  --  33*  GLUCOSE 116*  --  215*  BUN 40*  --  43*  CREATININE 4.84*  --  4.73*  CALCIUM 7.9*  --  7.8*  MG  --  1.7  --   AST 184*  --   --   ALT 72*  --   --   ALKPHOS 100  --   --   BILITOT 1.1  --   --    ------------------------------------------------------------------------------------------------------------------  Cardiac Enzymes  Recent Labs Lab 07/29/15 2244 07/30/15 0209 07/30/15 0814  TROPONINI 0.18* 0.22* 0.39*   ------------------------------------------------------------------------------------------------------------------  RADIOLOGY:  No results found.   ASSESSMENT AND PLAN:   50 year old female with a history of diabetes and morbid obesity who presented with septic shock with hyper Acute respiratory failure, acute renal failure and rhabdomyolysis  1. Acute hypercapnic respiratory failure  with history of OSA: Patient was initially intubated and now extubated and doing well.  She needs outpatient sleep study reevaluation for OSA.Marland Kitchen Continue CPAP at night while in hospital.   2. Acute renal failure: This is due to ATN from hypotension and  medications including metformin, NSAIDS and ACEI. Initially patient had oliguric renal failure and severe hyperkalemia and required emergent dialysis. Patient with good urine output. Not much change and today's creatinine. Appreciate renal consult.  3. Rhabdomyolysis: Improved.  4. Possible sepsis: Sepsis has been ruled out.  5. Diabetes: Continue TRADJENTA and send scale insulin with ADA diet. 6. Hypokalemia: Gently replete due to renal failure. And follow. 7. Left leg pain: Order Dopplers to evaluate for DVT.   Management plans discussed with the patient and she is in agreement.  CODE STATUS: FULL  TOTAL TIME TAKING CARE OF THIS PATIENT:  26 minutes.    POSSIBLE D/C 2 days, DEPENDING ON CLINICAL CONDITION. Physical therapy is recommending skilled nursing facility  Coner Gibbard M.D on 08/03/2015 at 11:12 AM  Between 7am to 6pm - Pager - [phone] After 6pm go to [email or web address] - Social research officer, government  [facility]  Office  [phone]  CC: Primary care physician; WHITE, Valentina Shaggy, FNP  Note: This dictation was prepared with Dragon dictation along with smaller phrase technology. Any transcriptional errors that result from this process are unintentional.

## 2015-08-03 NOTE — Progress Notes (Signed)
Physical Therapy Treatment Patient Details Name: Tiffany Gomez MRN: [id number] DOB: 04-14-1965 Today's Date: 08/03/2015    History of Present Illness      PT Comments    Pt in bed awake.  Checked with primary nurse prior to session and received approval for therapy.  Supine to edge of bed with min a x 1.  Sitting edge of bed without loss of balance with supervision.  Was able to stand and transfer to chair at bedside with min guard x 2 for safety and equipment.  Once seated participated in seated exercises as described below.  HR in 90's during session.     Follow Up Recommendations  SNF     Equipment Recommendations       Recommendations for Other Services       Precautions / Restrictions Precautions Precautions: Fall Restrictions Weight Bearing Restrictions: No    Mobility  Bed Mobility Overal bed mobility: Needs Assistance Bed Mobility: Supine to Sit     Supine to sit: Min assist;HOB elevated     General bed mobility comments: with bed rails; able to initiate LE movement well, but needed min A to come into full sitting;   Transfers Overall transfer level: Needs assistance Equipment used: Rolling walker (2 wheeled) Transfers: Sit to/from Stand Sit to Stand: Min guard         General transfer comment: sit<>Stand from bed, CGA with RW with min Vcs for hand placement and to increase erect posture once standing;   Ambulation/Gait Ambulation/Gait assistance: Min guard Ambulation Distance (Feet): 3 Feet Assistive device: Rolling walker (2 wheeled) Gait Pattern/deviations: Step-to pattern Gait velocity: very slow;  Gait velocity interpretation: <1.8 ft/sec, indicative of risk for recurrent falls General Gait Details: slower gait speed; decreased foot clearance;    Stairs            Wheelchair Mobility    Modified Rankin (Stroke Patients Only)       Balance Overall balance assessment: Needs assistance Sitting-balance support: Feet  supported Sitting balance-Leahy Scale: Fair Sitting balance - Comments: able to demonstrate erect posture, but needs 2 HHA on bed for steady;    Standing balance support: Bilateral upper extremity supported Standing balance-Leahy Scale: Fair                      Cognition Arousal/Alertness: Awake/alert Behavior During Therapy: WFL for tasks assessed/performed Overall Cognitive Status: Within Functional Limits for tasks assessed                      Exercises General Exercises - Lower Extremity Ankle Circles/Pumps: AROM;Both;20 reps;Seated Quad Sets: AROM;Both;10 reps;Supine Gluteal Sets: AROM;Left;10 reps;Supine Long Arc Quad: AROM;Both;10 reps;Seated Hip ABduction/ADduction: AROM;Both;10 reps;Seated    General Comments        Pertinent Vitals/Pain Pain Assessment: No/denies pain    Home Living                      Prior Function            PT Goals (current goals can now be found in the care plan section) Acute Rehab PT Goals Patient Stated Goal: "I want to be able to get stronger so that I can go home."    Frequency  Min 2X/week    PT Plan      Co-evaluation             End of Session   Activity Tolerance: Patient tolerated treatment well  Patient left: in chair;with call bell/phone within reach;with chair alarm set     Time: [id number] PT Time Calculation (min) (ACUTE ONLY): 24 min  Charges:  $Therapeutic Exercise: 8-22 mins $Therapeutic Activity: 8-22 mins                    G Codes:      Danielle DessSarah Zarahi Fuerst, PTA 08/03/2015, 11:06 AM

## 2015-08-03 NOTE — Clinical Social Work Note (Signed)
Clinical Social Work Assessment  Patient Details  Name: Tiffany Gomez MRN: [id number] Date of Birth: 07-Aug-1965  Date of referral:  08/03/15               Reason for consult:  Facility Placement                Permission sought to share information with:  Chartered certified accountant granted to share information::  Yes, Verbal Permission Granted  Name::      [place]::   [place]   Relationship::     Contact Information:     Housing/Transportation Living arrangements for the past 2 months:  [place] of Information:  Patient, Parent Patient Interpreter Needed:  None Criminal Activity/Legal Involvement Pertinent to Current Situation/Hospitalization:  No - Comment as needed Significant Relationships:  Parents Lives with:  Self Do you feel safe going back to the place where you live?  Yes Need for family participation in patient care:  Yes (Comment)  Care giving concerns:  Patient lives alone in an apartment in [place].    Social Worker assessment / plan:  Holiday representative (CSW) reviewed chart and noted that PT is recommending SNF. Patient transferred to 1A from ICU. CSW met with patient alone at bedside. Patient was laying in the bed and was alert and oriented. CSW introduced self and explained role of CSW department. Patient reported that she lives in an apartment in [place] alone and her mother Tuana Hoheisel is her primary support. CSW explained that PT is recommending SNF. CSW explained that patient's Medicare will pay for days 1-20 at 100%. CSW also explained that since patient does not have a supplement patient will have a co-pay starting on day 21 at SNF around $160 per day. Patient verbalized her understanding. Patient was admitted to inpatient status at [facility] on 07/29/15, she has met the 3 night inpatient stay criteria for Medicare to pay for SNF. Patient reported that her c-pap she has at home is broke. Per  chart patient requires c-pap at night time. Per patient she has a sleep study scheduled for 08/12/15. Patient is agreeable to SNF search and prefers [facility]. Per MD note patient does not require acute dialysis at this time.   FL2 complete and faxed out. CSW presented bed offers to patient. Patient chose [facility]. Per [place] admissions coordinator at [facility] is ordering a c-pap and bariatric bed for patient. Patient requested CSW to call her mother and make her aware of above. CSW contacted patient's mother Gracilyn Gunia and made her aware of above. Mother thanked CSW for calling and is in agreement with plan. CSW will continue to follow and assist as needed.    Employment status:  Disabled (Comment on whether or not currently receiving Disability) Insurance information:  Medicare PT Recommendations:  [place] / Referral to community resources:  [place]  Patient/Family's Response to care:  Patient and mother are in agreement with plan for patient to D/C to [facility].   Patient/Family's Understanding of and Emotional Response to Diagnosis, Current Treatment, and Prognosis:  Patient and mother were very pleasant and thanked CSW for assisting with placement.   Emotional Assessment Appearance:  Appears stated age Attitude/Demeanor/Rapport:    Affect (typically observed):  Accepting, Adaptable, Pleasant Orientation:  Oriented to Self, Oriented to Place, Oriented to  Time, Oriented to Situation Alcohol / Substance use:  Not Applicable Psych involvement (Current and /  or in the community):  No (Comment)  Discharge Needs  Concerns to be addressed:  Discharge Planning Concerns Readmission within the last 30 days:  No Current discharge risk:  Dependent with Mobility, Chronically ill Barriers to Discharge:  Continued Medical Work up   Loralyn Freshwater, LCSW 08/03/2015, 10:58 AM

## 2015-08-04 ENCOUNTER — Inpatient Hospital Stay: Payer: Medicare Other

## 2015-08-04 LAB — BASIC METABOLIC PANEL
ANION GAP: 11 (ref 5–15)
BUN: 44 mg/dL — ABNORMAL HIGH (ref 6–20)
CALCIUM: 8 mg/dL — AB (ref 8.9–10.3)
CO2: 32 mmol/L (ref 22–32)
Chloride: 97 mmol/L — ABNORMAL LOW (ref 101–111)
Creatinine, Ser: 4.42 mg/dL — ABNORMAL HIGH (ref 0.44–1.00)
GFR, EST AFRICAN AMERICAN: 13 mL/min — AB (ref >60)
GFR, EST NON AFRICAN AMERICAN: 11 mL/min — AB (ref >60)
GLUCOSE: 127 mg/dL — AB (ref 65–99)
Potassium: 3.5 mmol/L (ref 3.5–5.1)
Sodium: 140 mmol/L (ref 135–145)

## 2015-08-04 LAB — GLUCOSE, CAPILLARY
GLUCOSE-CAPILLARY: 127 mg/dL — AB (ref 65–99)
GLUCOSE-CAPILLARY: 151 mg/dL — AB (ref 65–99)
GLUCOSE-CAPILLARY: 157 mg/dL — AB (ref 65–99)
Glucose-Capillary: 97 mg/dL (ref 65–99)

## 2015-08-04 MED ORDER — TRAMADOL HCL 50 MG PO TABS
50.0000 mg | ORAL_TABLET | Freq: Four times a day (QID) | ORAL | Status: DC | PRN
  Filled 2015-08-04: qty 1

## 2015-08-04 NOTE — Consult Note (Signed)
ORTHOPAEDIC CONSULTATION  REQUESTING PHYSICIAN: Adrian Saran, MD  Chief Complaint: Left thigh pain  HPI: Tiffany Gomez is a 50 y.o. female who complains of  left thigh pain. Patient has a history of diabetes, asthma, hypertension and morbid obesity. She was brought to the [facility] emergency Department when she was found down on the side of her bed at home on 07/29/2015. Patient was found to have rhabdomyolysis, acute renal failure in septic shock. She was intubated and sent to the ICU. Patient has now been transferred to the regular floor. She is having significant left thigh pain which makes it difficult for her to bear weight. Orthopedics is consulted due to her complaints of left thigh pain. Patient does not recall any injury.  It is unclear from the medical record which side the patient was found on by EMS.  Pain has been present since she left the ICU.  Past Medical History  Diagnosis Date  . Diabetes (HCC)   . Sleep apnea   . Leg pain   . Asthma   . Depression   . Morbid obesity (HCC)   . Essential hypertension   . Hyperlipidemia   . Anxiety     Panic attacks   Past Surgical History  Procedure Laterality Date  . Gastric bypass    . Laparoscopic cholecystectomy    . Cardiac catheterization    . Anterior cervical decomp/discectomy fusion N/A 06/16/2014    Procedure: ANTERIOR CERVICAL DECOMPRESSION/DISCECTOMY FUSION CERVICAL FIVE-SIX;  Surgeon: Julio Sicks, MD;  Location: [facility];  Service: Neurosurgery;  Laterality: N/A;   Social History   Social History  . Marital Status: Single    Spouse Name: N/A  . Number of Children: N/A  . Years of Education: N/A   Social History Main Topics  . Smoking status: Never Smoker   . Smokeless tobacco: None  . Alcohol Use: No  . Drug Use: No  . Sexual Activity: Not Asked   Other Topics Concern  . None   Social History Narrative   Family History  Problem Relation Age of Onset  . Heart failure Mother    No  Known Allergies Prior to Admission medications   Medication Sig Start Date End Date Taking? Authorizing Provider  albuterol (PROVENTIL HFA;VENTOLIN HFA) 108 (90 BASE) MCG/ACT inhaler Inhale 2 puffs into the lungs every 6 (six) hours as needed for wheezing or shortness of breath.   Yes Historical Provider, MD  alprazolam Prudy Feeler) 2 MG tablet Take 2 mg by mouth 4 (four) times daily as needed for anxiety.    Yes Historical Provider, MD  clonazePAM (KLONOPIN) 1 MG tablet Take 1 mg by mouth at bedtime.    Yes Historical Provider, MD  cyclobenzaprine (FLEXERIL) 5 MG tablet Take 5 mg by mouth every 8 (eight) hours as needed for muscle spasms.    Yes Historical Provider, MD  gabapentin (NEURONTIN) 300 MG capsule Take 300-600 mg by mouth 3 (three) times daily as needed (for pain).   Yes Historical Provider, MD  glipiZIDE (GLUCOTROL XL) 5 MG 24 hr tablet Take 5 mg by mouth daily with breakfast.   Yes Historical Provider, MD  ibuprofen (ADVIL,MOTRIN) 800 MG tablet Take 800 mg by mouth every 8 (eight) hours as needed for headache or mild pain.    Yes Historical Provider, MD  linagliptin (TRADJENTA) 5 MG TABS tablet Take 5 mg by mouth daily.   Yes Historical Provider, MD  lisinopril (PRINIVIL,ZESTRIL) 5 MG tablet Take 5 mg by mouth daily.  Yes Historical Provider, MD  megestrol (MEGACE) 40 MG tablet Take 40 mg by mouth daily.    Yes Historical Provider, MD  metFORMIN (GLUCOPHAGE) 500 MG tablet Take 500 mg by mouth 2 (two) times daily with a meal.    Yes Historical Provider, MD  montelukast (SINGULAIR) 10 MG tablet Take 10 mg by mouth at bedtime.   Yes Historical Provider, MD  Oxycodone HCl 10 MG TABS Take 10 mg by mouth 5 (five) times daily as needed (for pain).   Yes Historical Provider, MD  pravastatin (PRAVACHOL) 20 MG tablet Take 20 mg by mouth at bedtime.   Yes Historical Provider, MD  venlafaxine (EFFEXOR) 75 MG tablet Take 75 mg by mouth 2 (two) times daily with a meal.    Yes Historical Provider, MD   ziprasidone (GEODON) 80 MG capsule Take 160 mg by mouth daily with supper.    Yes Historical Provider, MD   Ct Knee Left Wo Contrast  08/04/2015  CLINICAL DATA:  Chronic Left knee pain.  No known injury. EXAM: CT OF THE right KNEE WITHOUT CONTRAST TECHNIQUE: Multidetector CT imaging of the left knee was performed according to the standard protocol. Multiplanar CT image reconstructions were also generated. COMPARISON:  None. FINDINGS: Advanced tricompartmental degenerative changes with joint space narrowing, osteophytic spurring and subchondral cystic change. No acute fracture or osteochondral lesions. No chondrocalcinosis. Partially calcified loose bodies noted in the posterior joint space laterally. Small joint effusion. IMPRESSION: Advanced tricompartmental degenerative changes, particularly for the patient's age. Partially ossified loose bodies. Small joint effusion. Electronically Signed   By: Rudie Meyer M.D.   On: 08/04/2015 11:58   US Venous Img Lower Unilateral Left  08/03/2015  CLINICAL DATA:  Left lower extremity edema and pain. EXAM: LEFT LOWER EXTREMITY VENOUS DOPPLER ULTRASOUND TECHNIQUE: Gray-scale sonography with graded compression, as well as color Doppler and duplex ultrasound were performed to evaluate the lower extremity deep venous systems from the level of the common femoral vein and including the common femoral, femoral, profunda femoral, popliteal and calf veins including the posterior tibial, peroneal and gastrocnemius veins when visible. The superficial great saphenous vein was also interrogated. Spectral Doppler was utilized to evaluate flow at rest and with distal augmentation maneuvers in the common femoral, femoral and popliteal veins. COMPARISON:  Previous bilateral lower extremity venous duplex study on 07/13/2009 FINDINGS: Contralateral Common Femoral Vein: Respiratory phasicity is normal and symmetric with the symptomatic side. No evidence of thrombus. Normal compressibility.  Common Femoral Vein: No evidence of thrombus. Normal compressibility, respiratory phasicity and response to augmentation. Saphenofemoral Junction: No evidence of thrombus. Normal compressibility and flow on color Doppler imaging. Profunda Femoral Vein: No evidence of thrombus. Normal compressibility and flow on color Doppler imaging. Femoral Vein: No evidence of thrombus. Normal compressibility, respiratory phasicity and response to augmentation. Popliteal Vein: No evidence of thrombus. Normal compressibility, respiratory phasicity and response to augmentation. Calf Veins: No evidence of thrombus. Normal compressibility and flow on color Doppler imaging. Superficial Great Saphenous Vein: No evidence of thrombus. Normal compressibility and flow on color Doppler imaging. Venous Reflux:  None. Other Findings: No evidence of superficial thrombophlebitis or abnormal fluid collection. IMPRESSION: No evidence of left lower extremity deep venous thrombosis. Electronically Signed   By: Irish Lack M.D.   On: 08/03/2015 15:40    Positive ROS: All other systems have been reviewed and were otherwise negative with the exception of those mentioned in the HPI and as above.  Physical Exam: General: Alert, no acute distress  MUSCULOSKELETAL: Patient sitting up in a chair in her room. It is difficult to fully examine the patient based on her body habitus. Left lower extremity: Patient's skin is intact. She has no detectable edema due to her body habitus but the patient feels that her left thigh is swollen. Her compartments are soft and compressible. She can actively extend her left knee to near extension and can flex to approximately 90. She has no calf tenderness. She has intact sensation light touch in palpable pedal pulses. She had no significant pain with internal or external rotation of the left hip with hip flexion.  Assessment: Left thigh pain possibly due to rhabdomyolysis, osteoarthritis or  contusion  Plan: I reviewed the patient's CT scan of the left knee. This shows advanced degenerative changes. She has tricompartmental osteoarthritis. There is no evidence of fracture or dislocation of the knee. The patient's primary pain seems to be over the distal thigh. I recommend  AP pelvis and left femur films to evaluate for possible occult injury.  According to the physical therapy note on 08/03/15 the patient was able to transfer with assistance to her chair. She may continue physical therapy as tolerated. It is likely the patient will require skilled nursing facility care upon discharge.    Juanell FairlyKRASINSKI, Jaysten Essner, MD    08/04/2015 5:47 PM

## 2015-08-04 NOTE — Progress Notes (Signed)
Subjective:  Creatinine currently slightly down to 4.4. She continues on 0.9 normal saline at 65 cc per hour.     Objective:  Vital signs in last 24 hours:  Temp:  [97.7 F (36.5 C)-98.9 F (37.2 C)] 98.9 F (37.2 C) (05/02 0731) Pulse Rate:  [81-91] 81 (05/02 1155) Resp:  [18-20] 20 (05/02 1155) BP: (130-149)/(67-84) 148/79 mmHg (05/02 1155) SpO2:  [99 %-100 %] 100 % (05/02 1155) Weight:  [173.728 kg (383 lb)] 173.728 kg (383 lb) (05/02 0423)  Weight change: 0 kg (0 lb) Filed Weights   08/02/15 1716 08/03/15 0500 08/04/15 0423  Weight: 173.728 kg (383 lb) 173.728 kg (383 lb) 173.728 kg (383 lb)    Intake/Output:    Intake/Output Summary (Last 24 hours) at 08/04/15 1459 Last data filed at 08/04/15 1300  Gross per 24 hour  Intake 2096.76 ml  Output   1950 ml  Net 146.76 ml     Physical Exam: General:  laying in bed, NAD  HEENT  Anicteric, moist oral mucous membranes   Neck  supple  Pulm/lungs  coarse breath sounds, normal effort  CVS/Heart  regular, no rubs  Abdomen:  Soft, NT, non distended, obese  Extremities: + dependent edema  Neurologic: Alert, able to follow commands   Skin: No acute rashes  Access: Rt IJ vascath (4/26) Dr Dema SeverinMungal       Basic Metabolic Panel:   Recent Labs Lab 07/29/15 2244 07/30/15 0335 07/31/15 0500 08/01/15 0534 08/02/15 0424 08/02/15 1616 08/03/15 0914 08/04/15 0505  NA 135 137 138 142 142  --  139 140  K 4.9 3.6 3.4* 3.2* 3.4*  --  3.3* 3.5  CL 105 101 95* 94* 96*  --  96* 97*  CO2 21* 26 30 32 33*  --  33* 32  GLUCOSE 265* 204* 181* 137* 116*  --  215* 127*  BUN 38* 27* 28* 34* 40*  --  43* 44*  CREATININE 4.66*  4.57* 3.23* 3.76* 4.64* 4.84*  --  4.73* 4.42*  CALCIUM 6.4* 6.7* 7.0* 7.6* 7.9*  --  7.8* 8.0*  MG 1.6* 1.5*  --   --   --  1.7  --   --   PHOS 6.6* 4.8*  --  4.5  --   --   --   --      CBC:  Recent Labs Lab 07/30/15 0335 07/31/15 0500 08/01/15 0544 08/02/15 0424 08/03/15 0914  WBC 20.1*  15.6* 15.9* 15.8* 15.3*  HGB 10.3* 8.0* 7.6* 8.5* 8.2*  HCT 32.6* 24.7* 23.3* 26.7* 26.0*  MCV 85.0 84.5 83.2 85.6 84.2  PLT 332 255 234 266 261      Microbiology:  Recent Results (from the past 720 hour(s))  Culture, blood (routine x 2)     Status: None   Collection Time: 07/29/15  5:13 PM  Result Value Ref Range Status   Specimen Description BLOOD RIGHT FATTY CASTS  Final   Special Requests BOTTLES DRAWN AEROBIC AND ANAEROBIC  1CC  Final   Culture  Setup Time   Final    GRAM POSITIVE COCCI ANAEROBIC BOTTLE ONLY CRITICAL RESULT CALLED TO, READ BACK BY AND VERIFIED WITH: JASON ROBBINS AT 2110 07/30/15 KLK.    Culture   Final    COAGULASE NEGATIVE STAPHYLOCOCCUS ANAEROBIC BOTTLE ONLY THE SIGNIFICANCE OF ISOLATING THIS ORGANISM FROM A SINGLE VENIPUNCTURE CANNOT BE PREDICTED WITHOUT FURTHER CLINICAL AND CULTURE CORRELATION. SUSCEPTIBILITIES AVAILABLE ONLY ON REQUEST.    Report Status 08/03/2015 FINAL  Final  Urine culture     Status: None   Collection Time: 07/29/15  5:13 PM  Result Value Ref Range Status   Specimen Description URINE, CATHETERIZED  Final   Special Requests NONE  Final   Culture NO GROWTH 2 DAYS  Final   Report Status 07/31/2015 FINAL  Final  Blood Culture ID Panel (Reflexed)     Status: Abnormal   Collection Time: 07/29/15  5:13 PM  Result Value Ref Range Status   Enterococcus species NOT DETECTED NOT DETECTED Final   Vancomycin resistance NOT DETECTED NOT DETECTED Final   Listeria monocytogenes NOT DETECTED NOT DETECTED Final   Staphylococcus species DETECTED (A) NOT DETECTED Final    Comment: CRITICAL RESULT CALLED TO, READ BACK BY AND VERIFIED WITH: JASON ROBBINS AT 2110 07/30/15 KLK.    Staphylococcus aureus NOT DETECTED NOT DETECTED Final   Methicillin resistance NOT DETECTED NOT DETECTED Final   Streptococcus species NOT DETECTED NOT DETECTED Final   Streptococcus agalactiae NOT DETECTED NOT DETECTED Final   Streptococcus pneumoniae NOT DETECTED NOT  DETECTED Final   Streptococcus pyogenes NOT DETECTED NOT DETECTED Final   Acinetobacter baumannii NOT DETECTED NOT DETECTED Final   Enterobacteriaceae species NOT DETECTED NOT DETECTED Final   Enterobacter cloacae complex NOT DETECTED NOT DETECTED Final   Escherichia coli NOT DETECTED NOT DETECTED Final   Klebsiella oxytoca NOT DETECTED NOT DETECTED Final   Klebsiella pneumoniae NOT DETECTED NOT DETECTED Final   Proteus species NOT DETECTED NOT DETECTED Final   Serratia marcescens NOT DETECTED NOT DETECTED Final   Carbapenem resistance NOT DETECTED NOT DETECTED Final   Haemophilus influenzae NOT DETECTED NOT DETECTED Final   Neisseria meningitidis NOT DETECTED NOT DETECTED Final   Pseudomonas aeruginosa NOT DETECTED NOT DETECTED Final   Candida albicans NOT DETECTED NOT DETECTED Final   Candida glabrata NOT DETECTED NOT DETECTED Final   Candida krusei NOT DETECTED NOT DETECTED Final   Candida parapsilosis NOT DETECTED NOT DETECTED Final   Candida tropicalis NOT DETECTED NOT DETECTED Final  Culture, blood (routine x 2)     Status: None (Preliminary result)   Collection Time: 07/29/15  5:20 PM  Result Value Ref Range Status   Specimen Description BLOOD LEFT ASSIST CONTROL  Final   Special Requests BOTTLES DRAWN AEROBIC AND ANAEROBIC 1CC  Final   Culture NO GROWTH 4 DAYS  Final   Report Status PENDING  Incomplete  MRSA PCR Screening     Status: None   Collection Time: 07/29/15 10:42 PM  Result Value Ref Range Status   MRSA by PCR NEGATIVE NEGATIVE Final    Comment:        The GeneXpert MRSA Assay (FDA approved for NASAL specimens only), is one component of a comprehensive MRSA colonization surveillance program. It is not intended to diagnose MRSA infection nor to guide or monitor treatment for MRSA infections.   Gastrointestinal Panel by PCR , Stool     Status: None   Collection Time: 08/03/15  1:00 PM  Result Value Ref Range Status   Campylobacter species NOT DETECTED NOT  DETECTED Final   Plesimonas shigelloides NOT DETECTED NOT DETECTED Final   Salmonella species NOT DETECTED NOT DETECTED Final   Yersinia enterocolitica NOT DETECTED NOT DETECTED Final   Vibrio species NOT DETECTED NOT DETECTED Final   Vibrio cholerae NOT DETECTED NOT DETECTED Final   Enteroaggregative E coli (EAEC) NOT DETECTED NOT DETECTED Final   Enteropathogenic E coli (EPEC) NOT DETECTED NOT DETECTED Final   Enterotoxigenic E  coli (ETEC) NOT DETECTED NOT DETECTED Final   Shiga like toxin producing E coli (STEC) NOT DETECTED NOT DETECTED Final   E. coli O157 NOT DETECTED NOT DETECTED Final   Shigella/Enteroinvasive E coli (EIEC) NOT DETECTED NOT DETECTED Final   Cryptosporidium NOT DETECTED NOT DETECTED Final   Cyclospora cayetanensis NOT DETECTED NOT DETECTED Final   Entamoeba histolytica NOT DETECTED NOT DETECTED Final   Giardia lamblia NOT DETECTED NOT DETECTED Final   Adenovirus F40/41 NOT DETECTED NOT DETECTED Final   Astrovirus NOT DETECTED NOT DETECTED Final   Norovirus GI/GII NOT DETECTED NOT DETECTED Final   Rotavirus A NOT DETECTED NOT DETECTED Final   Sapovirus (I, II, IV, and V) NOT DETECTED NOT DETECTED Final    Coagulation Studies: No results for input(s): LABPROT, INR in the last 72 hours.  Urinalysis: No results for input(s): COLORURINE, LABSPEC, PHURINE, GLUCOSEU, HGBUR, BILIRUBINUR, KETONESUR, PROTEINUR, UROBILINOGEN, NITRITE, LEUKOCYTESUR in the last 72 hours.  Invalid input(s): APPERANCEUR    Imaging: Ct Knee Left Wo Contrast  08/04/2015  CLINICAL DATA:  Chronic Left knee pain.  No known injury. EXAM: CT OF THE right KNEE WITHOUT CONTRAST TECHNIQUE: Multidetector CT imaging of the left knee was performed according to the standard protocol. Multiplanar CT image reconstructions were also generated. COMPARISON:  None. FINDINGS: Advanced tricompartmental degenerative changes with joint space narrowing, osteophytic spurring and subchondral cystic change. No acute  fracture or osteochondral lesions. No chondrocalcinosis. Partially calcified loose bodies noted in the posterior joint space laterally. Small joint effusion. IMPRESSION: Advanced tricompartmental degenerative changes, particularly for the patient's age. Partially ossified loose bodies. Small joint effusion. Electronically Signed   By: Rudie Meyer M.D.   On: 08/04/2015 11:58   US Venous Img Lower Unilateral Left  08/03/2015  CLINICAL DATA:  Left lower extremity edema and pain. EXAM: LEFT LOWER EXTREMITY VENOUS DOPPLER ULTRASOUND TECHNIQUE: Gray-scale sonography with graded compression, as well as color Doppler and duplex ultrasound were performed to evaluate the lower extremity deep venous systems from the level of the common femoral vein and including the common femoral, femoral, profunda femoral, popliteal and calf veins including the posterior tibial, peroneal and gastrocnemius veins when visible. The superficial great saphenous vein was also interrogated. Spectral Doppler was utilized to evaluate flow at rest and with distal augmentation maneuvers in the common femoral, femoral and popliteal veins. COMPARISON:  Previous bilateral lower extremity venous duplex study on 07/13/2009 FINDINGS: Contralateral Common Femoral Vein: Respiratory phasicity is normal and symmetric with the symptomatic side. No evidence of thrombus. Normal compressibility. Common Femoral Vein: No evidence of thrombus. Normal compressibility, respiratory phasicity and response to augmentation. Saphenofemoral Junction: No evidence of thrombus. Normal compressibility and flow on color Doppler imaging. Profunda Femoral Vein: No evidence of thrombus. Normal compressibility and flow on color Doppler imaging. Femoral Vein: No evidence of thrombus. Normal compressibility, respiratory phasicity and response to augmentation. Popliteal Vein: No evidence of thrombus. Normal compressibility, respiratory phasicity and response to augmentation. Calf  Veins: No evidence of thrombus. Normal compressibility and flow on color Doppler imaging. Superficial Great Saphenous Vein: No evidence of thrombus. Normal compressibility and flow on color Doppler imaging. Venous Reflux:  None. Other Findings: No evidence of superficial thrombophlebitis or abnormal fluid collection. IMPRESSION: No evidence of left lower extremity deep venous thrombosis. Electronically Signed   By: Irish Lack M.D.   On: 08/03/2015 15:40     Medications:   . sodium chloride 65 mL/hr at 08/04/15 0427   . clonazePAM  1 mg Oral  QHS  . docusate sodium  100 mg Oral BID  . heparin subcutaneous  5,000 Units Subcutaneous Q8H  . insulin aspart  0-15 Units Subcutaneous TID WC  . linagliptin  5 mg Oral Daily  . metoprolol tartrate  25 mg Oral BID  . polyethylene glycol  17 g Oral Daily  . pravastatin  20 mg Oral QHS  . sodium chloride flush  10-40 mL Intracatheter Q12H  . venlafaxine  75 mg Oral BID WC  . ziprasidone  160 mg Oral Q supper   sodium chloride, acetaminophen, albuterol, alprazolam, ipratropium-albuterol, ondansetron (ZOFRAN) IV, oxyCODONE-acetaminophen, traMADol  Assessment/ Plan:  50 y.o. female with a PMHX of morbid obesity, DM, OSA, Depression, HTN, HLD, Gastric Bypass, Cervical disc fusion, chronic pain (lumbar radiculopathy), was admitted on 07/29/2015 after being found unresponsive at home  1. ARF - Likely ATN, Patient was hypotensive At the time of presentation. Baseline Cr 0.90 (06/16/2014) Other contributing factors include metformin, ACE-i, Ibuprofen, Rhabdomyolysis 2. Severe Hyperkerkalemia due to Rhabdomyolysis (CK > 9000->16000), now hypokalemia 3. Acute respiratory failure- intubated in ER 4/26- extubated 4/28 4. Severe Acidosis, Ph 7.10->7.33 5. Altered mental status- improved 6. Morbid obesity  Plan:  Creatinine only marginally better today.  owever urine output was 1.4 L over the preceding 24 hours.  Given the fact that renal function is  improving continue IV fluid hydration with 0.9 normal saline.  Her hyperkalemia has been corrected.  From a respiratory status she appears to be doing well post extubation.  Continue to monitor renal function and urine output daily for now.  No acute indication for dialysis at the moment.    LOS: 6 Dalal Livengood 5/2/20172:59 PM

## 2015-08-04 NOTE — Progress Notes (Signed)
[facility] - [place] at [facility]   PATIENT NAME: Tiffany Gomez    MR#:  [id number]  DATE OF BIRTH:  Oct 07, 1965  SUBJECTIVE:   Patient still complaining of left knee pain and swelling. She is unable to ambulate due to pain in her knee.Marland Kitchen.  REVIEW OF SYSTEMS:    Review of Systems  Constitutional: Negative for fever, chills and malaise/fatigue.  HENT: Negative for ear discharge, ear pain, hearing loss, nosebleeds and sore throat.   Eyes: Negative for blurred vision and pain.  Respiratory: Negative for cough, hemoptysis, shortness of breath and wheezing.   Cardiovascular: Positive for leg swelling. Negative for chest pain and palpitations.  Gastrointestinal: Negative for nausea, vomiting, abdominal pain, diarrhea and blood in stool.  Genitourinary: Negative for dysuria, urgency, frequency, hematuria and flank pain.  Musculoskeletal: Positive for joint pain. Negative for back pain and falls.       Left leg pain  Neurological: Positive for weakness. Negative for dizziness, tremors, speech change, focal weakness, seizures and headaches.  Endo/Heme/Allergies: Does not bruise/bleed easily.  Psychiatric/Behavioral: Negative for depression, suicidal ideas and hallucinations.    Tolerating Diet:yes      DRUG ALLERGIES:  No Known Allergies  VITALS:  Blood pressure 145/84, pulse 91, temperature 98.9 F (37.2 C), temperature source Axillary, resp. rate 20, height 5\' 6"  (1.676 m), weight 173.728 kg (383 lb), SpO2 100 %.  PHYSICAL EXAMINATION:   Physical Exam  Constitutional: She is oriented to person, place, and time and well-developed, well-nourished, and in no distress. No distress.  Morbidly obese  HENT:  Head: Normocephalic.  Eyes: No scleral icterus.  Neck: Normal range of motion. Neck supple. No JVD present. No tracheal deviation present.  Cardiovascular: Normal rate, regular rhythm and normal heart sounds.  Exam reveals no gallop and no friction rub.   No  murmur heard. Pulmonary/Chest: Effort normal and breath sounds normal. No respiratory distress. She has no wheezes. She has no rales. She exhibits no tenderness.  Abdominal: Soft. Bowel sounds are normal. She exhibits no distension and no mass. There is no tenderness. There is no rebound and no guarding.  Musculoskeletal: Normal range of motion. She exhibits edema and tenderness.  Left knee pain and tender with some swelling  Neurological: She is alert and oriented to person, place, and time.  Skin: Skin is warm. No rash noted. No erythema.  Psychiatric: Affect and judgment normal.      LABORATORY PANEL:   CBC  Recent Labs Lab 08/03/15 0914  WBC 15.3*  HGB 8.2*  HCT 26.0*  PLT 261   ------------------------------------------------------------------------------------------------------------------  Chemistries   Recent Labs Lab 08/02/15 0424 08/02/15 1616  08/04/15 0505  NA 142  --   < > 140  K 3.4*  --   < > 3.5  CL 96*  --   < > 97*  CO2 33*  --   < > 32  GLUCOSE 116*  --   < > 127*  BUN 40*  --   < > 44*  CREATININE 4.84*  --   < > 4.42*  CALCIUM 7.9*  --   < > 8.0*  MG  --  1.7  --   --   AST 184*  --   --   --   ALT 72*  --   --   --   ALKPHOS 100  --   --   --   BILITOT 1.1  --   --   --   < > =  values in this interval not displayed. ------------------------------------------------------------------------------------------------------------------  Cardiac Enzymes  Recent Labs Lab 07/29/15 2244 07/30/15 0209 07/30/15 0814  TROPONINI 0.18* 0.22* 0.39*   ------------------------------------------------------------------------------------------------------------------  RADIOLOGY:  US Venous Img Lower Unilateral Left  08/03/2015  CLINICAL DATA:  Left lower extremity edema and pain. EXAM: LEFT LOWER EXTREMITY VENOUS DOPPLER ULTRASOUND TECHNIQUE: Gray-scale sonography with graded compression, as well as color Doppler and duplex ultrasound were performed to  evaluate the lower extremity deep venous systems from the level of the common femoral vein and including the common femoral, femoral, profunda femoral, popliteal and calf veins including the posterior tibial, peroneal and gastrocnemius veins when visible. The superficial great saphenous vein was also interrogated. Spectral Doppler was utilized to evaluate flow at rest and with distal augmentation maneuvers in the common femoral, femoral and popliteal veins. COMPARISON:  Previous bilateral lower extremity venous duplex study on 07/13/2009 FINDINGS: Contralateral Common Femoral Vein: Respiratory phasicity is normal and symmetric with the symptomatic side. No evidence of thrombus. Normal compressibility. Common Femoral Vein: No evidence of thrombus. Normal compressibility, respiratory phasicity and response to augmentation. Saphenofemoral Junction: No evidence of thrombus. Normal compressibility and flow on color Doppler imaging. Profunda Femoral Vein: No evidence of thrombus. Normal compressibility and flow on color Doppler imaging. Femoral Vein: No evidence of thrombus. Normal compressibility, respiratory phasicity and response to augmentation. Popliteal Vein: No evidence of thrombus. Normal compressibility, respiratory phasicity and response to augmentation. Calf Veins: No evidence of thrombus. Normal compressibility and flow on color Doppler imaging. Superficial Great Saphenous Vein: No evidence of thrombus. Normal compressibility and flow on color Doppler imaging. Venous Reflux:  None. Other Findings: No evidence of superficial thrombophlebitis or abnormal fluid collection. IMPRESSION: No evidence of left lower extremity deep venous thrombosis. Electronically Signed   By: Irish Lack M.D.   On: 08/03/2015 15:40     ASSESSMENT AND PLAN:   50 year old female with a history of diabetes and morbid obesity who presented with septic shock with hyper Acute respiratory failure, acute renal failure and  rhabdomyolysis  1. Acute hypercapnic respiratory failure with history of OSA: Patient was initially intubated and now extubated and doing well.  She needs outpatient sleep study reevaluation for OSA.Marland Kitchen Continue CPAP at night while in hospital.   2. Acute renal failure: This is due to ATN from hypotension and medications including metformin, NSAIDS and ACEI. Initially patient had oliguric renal failure and severe hyperkalemia and required emergent dialysis. Patient with good urine output. Creatinine with slow improvement. Appreciate renal consult.  3. Rhabdomyolysis: Improved. CK about 1100 today  4. Possible sepsis: Sepsis has been ruled out.  5. Diabetes: Continue TRADJENTA and send scale insulin with ADA diet. 6. Hypokalemia: Gently replete when necessary 7. Left leg pain: Lower extremity Doppler was negative for DVT. Order knee x-ray and noncontrast CT scan to evaluate for possible muscular tear. Orthopedic surgery consultation for further evaluation. Patient unable to have MRI performed due to weight..   Management plans discussed with the patient and she is in agreement.  CODE STATUS: FULL  TOTAL TIME TAKING CARE OF THIS PATIENT:  28 minutes.    POSSIBLE D/C 1-2 days, DEPENDING ON CLINICAL CONDITION. Physical therapy is recommending skilled nursing facility  Javanna Patin M.D on 08/04/2015 at 10:24 AM  Between 7am to 6pm - Pager - [phone] After 6pm go to [email or web address] - Social research officer, government  [facility]  Office  [phone]  CC: Primary care physician; WHITE, Valentina Shaggy, FNP  Note: This dictation was prepared  with Dragon dictation along with smaller phrase technology. Any transcriptional errors that result from this process are unintentional.

## 2015-08-04 NOTE — Progress Notes (Signed)
  Nutrition Follow-up  DOCUMENTATION CODES:   Morbid obesity  INTERVENTION:   -Cater to pt preferences on Heart healthy/Carb Modified Diet Order   NUTRITION DIAGNOSIS:   Inadequate oral intake related to acute illness as evidenced by NPO status.  GOAL:   Patient will meet greater than or equal to 90% of their needs  MONITOR:   Diet advancement, Labs, Weight trends, Skin  REASON FOR ASSESSMENT:   Ventilator    ASSESSMENT:   50 y/o female admitted with AMS, acute rhabdomylysis, diarrhea for the past 2 weeks.  Pt with acute respiratory failure intubated on 4/26 for airway protection, severe acidosis, emergent HD, sepsis.  Pt s/p extubation 4/28. Nephrology following, renal function, no further HD at this time.   Pt with knee pain, per MD note, LE negative for DVT.   Diet Order:  Diet heart healthy/carb modified Room service appropriate?: Yes with Assist; Fluid consistency:: Thin   Current Nutrition: Pt reports eating a chef salad with home fries today for lunch and tolerating well. Pt reports eating a good breakfast this morning and thinks her appetite is improved. Per chart review pt ate 100% of breakfast today, 100% of dinner last night, which is improved from <50% of meals recorded on Sunday.   Gastrointestinal Profile: Last BM: 08/04/2015  Medications: Colace, SS novolog, NS at 2765mL/hr Labs: reviewed, Cre 4.42, BUN 44, Glucose 127    Weight Trend since Admission: Filed Weights   08/02/15 1716 08/03/15 0500 08/04/15 0423  Weight: 383 lb (173.728 kg) 383 lb (173.728 kg) 383 lb (173.728 kg)     Skin:  Reviewed, no issues  Ideal Body Weight:  59.1 kg  BMI:  Body mass index is 61.85 kg/(m^2).  Estimated Nutritional Needs:   Kcal:  1600-2000 kcals  Protein:  89-118 g  Fluid:  >2 L per day  EDUCATION NEEDS:   No education needs identified at this time  Leda QuailAllyson Latonya Knight, RD, LDN Pager [phone]) [phone] Weekend/On-Call Pager [phone]

## 2015-08-05 ENCOUNTER — Ambulatory Visit (HOSPITAL_COMMUNITY)
Admission: AD | Admit: 2015-08-05 | Discharge: 2015-08-05 | Disposition: A | Discharge status: Home / Self Care | Payer: Medicare Other | Source: Other Acute Inpatient Hospital | Attending: Internal Medicine | Admitting: Internal Medicine

## 2015-08-05 ENCOUNTER — Inpatient Hospital Stay: Payer: Medicare Other

## 2015-08-05 DIAGNOSIS — M6282 Rhabdomyolysis: Secondary | ICD-10-CM | POA: Insufficient documentation

## 2015-08-05 LAB — GLUCOSE, CAPILLARY
Glucose-Capillary: 117 mg/dL — ABNORMAL HIGH (ref 65–99)
Glucose-Capillary: 136 mg/dL — ABNORMAL HIGH (ref 65–99)
Glucose-Capillary: 106 mg/dL — ABNORMAL HIGH (ref 65–99)

## 2015-08-05 LAB — CULTURE, BLOOD (ROUTINE X 2): Culture: NO GROWTH

## 2015-08-05 LAB — BASIC METABOLIC PANEL
Anion gap: 8 (ref 5–15)
BUN: 43 mg/dL — AB (ref 6–20)
CO2: 32 mmol/L (ref 22–32)
CREATININE: 3.54 mg/dL — AB (ref 0.44–1.00)
Calcium: 8 mg/dL — ABNORMAL LOW (ref 8.9–10.3)
Chloride: 100 mmol/L — ABNORMAL LOW (ref 101–111)
GFR calc Af Amer: 17 mL/min — ABNORMAL LOW (ref >60)
GFR, EST NON AFRICAN AMERICAN: 14 mL/min — AB (ref >60)
Glucose, Bld: 142 mg/dL — ABNORMAL HIGH (ref 65–99)
Potassium: 3.7 mmol/L (ref 3.5–5.1)
SODIUM: 140 mmol/L (ref 135–145)

## 2015-08-05 MED ORDER — INSULIN ASPART 100 UNIT/ML ~~LOC~~ SOLN: 0.0000 [IU] | Freq: Three times a day (TID) | SUBCUTANEOUS | Status: AC

## 2015-08-05 MED ORDER — CLONAZEPAM 1 MG PO TABS: 1.0000 mg | ORAL_TABLET | Freq: Every day | ORAL | Status: AC

## 2015-08-05 MED ORDER — OXYCODONE HCL 10 MG PO TABS: 10.0000 mg | ORAL_TABLET | Freq: Every day | ORAL | Status: DC | PRN

## 2015-08-05 MED ORDER — METOPROLOL TARTRATE 25 MG PO TABS: 25.0000 mg | ORAL_TABLET | Freq: Two times a day (BID) | ORAL | Status: AC

## 2015-08-05 NOTE — Progress Notes (Signed)
Pt up to chair.

## 2015-08-05 NOTE — Discharge Summary (Signed)
[facility] - [place] at [facility]   PATIENT NAME: Tiffany Gomez    MR#:  [id number]  DATE OF BIRTH:  04/11/1965  DATE OF ADMISSION:  07/29/2015 ADMITTING PHYSICIAN: No admitting provider for patient encounter.  DATE OF DISCHARGE:08/05/2015  PRIMARY CARE PHYSICIAN: WHITE, CHRISTINA M, FNP    ADMISSION DIAGNOSIS:  Respiratory failure (HCC) [J96.90] Hyperkalemia [E87.5] Non-traumatic rhabdomyolysis [M62.82] Hypotension, unspecified hypotension type [I95.9] Acute renal failure, unspecified acute renal failure type (HCC) [N17.9]  DISCHARGE DIAGNOSIS:  Active Problems:   Rhabdomyolysis   SECONDARY DIAGNOSIS:   Past Medical History  Diagnosis Date  . Diabetes (HCC)   . Sleep apnea   . Leg pain   . Asthma   . Depression   . Morbid obesity (HCC)   . Essential hypertension   . Hyperlipidemia   . Anxiety     Panic attacks    HOSPITAL COURSE:    50 year old female with a history of diabetes and morbid obesity who presented with septic shock with hyper Acute respiratory failure, acute renal failure and rhabdomyolysis  1. Acute hypercapnic respiratory failure with history of OSA: Patient was initially intubated and now extubated and doing well.  She needs outpatient sleep study reevaluation for OSA.Marland Kitchen Continue CPAP at night while in hospital. Continue oxygen during the day  2. Acute renal failure: This is due to ATN from hypotension and medications including metformin, NSAIDS and ACEI. Initially patient had oliguric renal failure and severe hyperkalemia and required emergent dialysis. Patient continues to have good urine output. Creatinine shows much improvement Appreciate renal consult. She'll follow with nephrology as an outpatient.   3. Rhabdomyolysis: Improved.  4. Possible sepsis: Sepsis has been ruled out.  5. Diabetes: Continue TRADJENTA and ADA diet. 6. Hypokalemia: Gently replete when necessary 7. Left leg/knee pain: Lower extremity Doppler  was negative for DVT. CT scan showed arthritic changes. Next time patient was seen and evaluated by orthopedic surgery. Pelvic x-ray was negative for fracture as well as femur x-ray. Continue supportive treatment with physical therapy and pain control.    DISCHARGE CONDITIONS AND DIET:   Stable for discharge on her heart healthy/renal diet  CONSULTS OBTAINED:  Treatment Team:  Wyatt Haste, MD Juanell Fairly, MD  DRUG ALLERGIES:  No Known Allergies  DISCHARGE MEDICATIONS:   Current Discharge Medication List    START taking these medications   Details  insulin aspart (NOVOLOG) 100 UNIT/ML injection Inject 0-15 Units into the skin 3 (three) times daily with meals. Qty: 10 mL, Refills: 11    metoprolol tartrate (LOPRESSOR) 25 MG tablet Take 1 tablet (25 mg total) by mouth 2 (two) times daily. Qty: 60 tablet, Refills: 0      CONTINUE these medications which have CHANGED   Details  clonazePAM (KLONOPIN) 1 MG tablet Take 1 tablet (1 mg total) by mouth at bedtime. Qty: 30 tablet, Refills: 4    Oxycodone HCl 10 MG TABS Take 1 tablet (10 mg total) by mouth 5 (five) times daily as needed (for pain). Qty: 30 tablet, Refills: 0      CONTINUE these medications which have NOT CHANGED   Details  albuterol (PROVENTIL HFA;VENTOLIN HFA) 108 (90 BASE) MCG/ACT inhaler Inhale 2 puffs into the lungs every 6 (six) hours as needed for wheezing or shortness of breath.    gabapentin (NEURONTIN) 300 MG capsule Take 300-600 mg by mouth 3 (three) times daily as needed (for pain).    linagliptin (TRADJENTA) 5 MG TABS tablet Take 5 mg by  mouth daily.    megestrol (MEGACE) 40 MG tablet Take 40 mg by mouth daily.  Refills: 3    montelukast (SINGULAIR) 10 MG tablet Take 10 mg by mouth at bedtime.    pravastatin (PRAVACHOL) 20 MG tablet Take 20 mg by mouth at bedtime.    venlafaxine (EFFEXOR) 75 MG tablet Take 75 mg by mouth 2 (two) times daily with a meal.  Refills: 2    ziprasidone  (GEODON) 80 MG capsule Take 160 mg by mouth daily with supper.  Refills: 2      STOP taking these medications     alprazolam (XANAX) 2 MG tablet      cyclobenzaprine (FLEXERIL) 5 MG tablet      glipiZIDE (GLUCOTROL XL) 5 MG 24 hr tablet      ibuprofen (ADVIL,MOTRIN) 800 MG tablet      lisinopril (PRINIVIL,ZESTRIL) 5 MG tablet      metFORMIN (GLUCOPHAGE) 500 MG tablet               Today   CHIEF COMPLAINT:   Patient has knee pain. No acute issues overnight.   VITAL SIGNS:  Blood pressure 137/68, pulse 92, temperature 98.5 F (36.9 C), temperature source Oral, resp. rate 20, height 5\' 6"  (1.676 m), weight 172.367 kg (380 lb), SpO2 100 %.   REVIEW OF SYSTEMS:  Review of Systems  Constitutional: Negative for fever, chills and malaise/fatigue.  HENT: Negative for ear discharge, ear pain, hearing loss, nosebleeds and sore throat.   Eyes: Negative for blurred vision and pain.  Respiratory: Negative for cough, hemoptysis, shortness of breath and wheezing.   Cardiovascular: Negative for chest pain, palpitations and leg swelling.  Gastrointestinal: Negative for nausea, vomiting, abdominal pain, diarrhea and blood in stool.  Genitourinary: Negative for dysuria.  Musculoskeletal: Positive for joint pain and falls. Negative for back pain.  Neurological: Negative for dizziness, tremors, speech change, focal weakness, seizures and headaches.  Endo/Heme/Allergies: Does not bruise/bleed easily.  Psychiatric/Behavioral: Negative for depression, suicidal ideas and hallucinations.     PHYSICAL EXAMINATION:  GENERAL:  50 y.o.-year-old Morbidly obese patient Sitting in chair with no acute distress.  NECK:  Supple, no jugular venous distention. No thyroid enlargement, no tenderness.  LUNGS: Normal breath sounds bilaterally, no wheezing, rales,rhonchi  No use of accessory muscles of respiration.  CARDIOVASCULAR: S1, S2 normal. No murmurs, rubs, or gallops.  ABDOMEN: Soft,  non-tender, non-distended. Bowel sounds present. No organomegaly or mass.  EXTREMITIES: 2+ pedal edema, no cyanosis, or clubbing.  PSYCHIATRIC: The patient is alert and oriented x 3.  SKIN: No obvious rash, lesion, or ulcer.  Musculoskeletal: Patient has range of motion in both knees. She has some edema over left knee. DATA REVIEW:   CBC  Recent Labs Lab 08/03/15 0914  WBC 15.3*  HGB 8.2*  HCT 26.0*  PLT 261    Chemistries   Recent Labs Lab 08/02/15 0424 08/02/15 1616  08/05/15 0612  NA 142  --   < > 140  K 3.4*  --   < > 3.7  CL 96*  --   < > 100*  CO2 33*  --   < > 32  GLUCOSE 116*  --   < > 142*  BUN 40*  --   < > 43*  CREATININE 4.84*  --   < > 3.54*  CALCIUM 7.9*  --   < > 8.0*  MG  --  1.7  --   --   AST 184*  --   --   --  ALT 72*  --   --   --   ALKPHOS 100  --   --   --   BILITOT 1.1  --   --   --   < > = values in this interval not displayed.  Cardiac Enzymes  Recent Labs Lab 07/29/15 2244 07/30/15 0209 07/30/15 0814  TROPONINI 0.18* 0.22* 0.39*    Microbiology Results  @  RADIOLOGY:  Dg Pelvis 1-2 Views  08/05/2015  CLINICAL DATA:  Left-sided pain.  Recent fall. EXAM: PELVIS - 1-2 VIEW COMPARISON:  08/03/2011 CT abdomen/ pelvis. FINDINGS: No pelvic fracture or diastasis. No evidence of malalignment at the hip joints. Minimal osteoarthritis in the weight-bearing portion of both hip joints. Degenerative changes in the visualized lower lumbar spine. No suspicious focal osseous lesion. Central venous catheter overlies the right groin and deep pelvis. IMPRESSION: Negative. Electronically Signed   By: Delbert Phenix M.D.   On: 08/05/2015 09:23   Ct Knee Left Wo Contrast  08/04/2015  CLINICAL DATA:  Chronic Left knee pain.  No known injury. EXAM: CT OF THE right KNEE WITHOUT CONTRAST TECHNIQUE: Multidetector CT imaging of the left knee was performed according to the standard protocol. Multiplanar CT image reconstructions were also generated.  COMPARISON:  None. FINDINGS: Advanced tricompartmental degenerative changes with joint space narrowing, osteophytic spurring and subchondral cystic change. No acute fracture or osteochondral lesions. No chondrocalcinosis. Partially calcified Gomez bodies noted in the posterior joint space laterally. Small joint effusion. IMPRESSION: Advanced tricompartmental degenerative changes, particularly for the patient's age. Partially ossified Gomez bodies. Small joint effusion. Electronically Signed   By: Rudie Meyer M.D.   On: 08/04/2015 11:58   US Venous Img Lower Unilateral Left  08/03/2015  CLINICAL DATA:  Left lower extremity edema and pain. EXAM: LEFT LOWER EXTREMITY VENOUS DOPPLER ULTRASOUND TECHNIQUE: Gray-scale sonography with graded compression, as well as color Doppler and duplex ultrasound were performed to evaluate the lower extremity deep venous systems from the level of the common femoral vein and including the common femoral, femoral, profunda femoral, popliteal and calf veins including the posterior tibial, peroneal and gastrocnemius veins when visible. The superficial great saphenous vein was also interrogated. Spectral Doppler was utilized to evaluate flow at rest and with distal augmentation maneuvers in the common femoral, femoral and popliteal veins. COMPARISON:  Previous bilateral lower extremity venous duplex study on 07/13/2009 FINDINGS: Contralateral Common Femoral Vein: Respiratory phasicity is normal and symmetric with the symptomatic side. No evidence of thrombus. Normal compressibility. Common Femoral Vein: No evidence of thrombus. Normal compressibility, respiratory phasicity and response to augmentation. Saphenofemoral Junction: No evidence of thrombus. Normal compressibility and flow on color Doppler imaging. Profunda Femoral Vein: No evidence of thrombus. Normal compressibility and flow on color Doppler imaging. Femoral Vein: No evidence of thrombus. Normal compressibility, respiratory  phasicity and response to augmentation. Popliteal Vein: No evidence of thrombus. Normal compressibility, respiratory phasicity and response to augmentation. Calf Veins: No evidence of thrombus. Normal compressibility and flow on color Doppler imaging. Superficial Great Saphenous Vein: No evidence of thrombus. Normal compressibility and flow on color Doppler imaging. Venous Reflux:  None. Other Findings: No evidence of superficial thrombophlebitis or abnormal fluid collection. IMPRESSION: No evidence of left lower extremity deep venous thrombosis. Electronically Signed   By: Irish Lack M.D.   On: 08/03/2015 15:40   Dg Femur Min 2 Views Left  08/05/2015  CLINICAL DATA:  Left thigh pain.  Fall. EXAM: LEFT FEMUR 2 VIEWS COMPARISON:  None. FINDINGS: No fracture  or suspicious focal osseous lesion. No evidence of malalignment at the left hip or left knee on the provided views. Tricompartmental osteoarthritis in the left knee joint, at least mild in severity. IMPRESSION: No fracture or focal osseous lesion in the left femur. Left knee tricompartmental osteoarthritis. Electronically Signed   By: Delbert PhenixJason A Poff M.D.   On: 08/05/2015 09:21      Management plans discussed with the patient and she is in agreement. Stable for discharge   Patient should follow up with nephrology in 2-3 days  CODE STATUS:     Code Status Orders        Start     Ordered   07/29/15 1923  Full code   Continuous     07/29/15 1954    Code Status History    Date Active Date Inactive Code Status Order ID Comments User Context   06/16/2014  6:15 PM 06/17/2014  4:57 PM Full Code [id number]  Julio SicksHenry Pool, MD Inpatient      TOTAL TIME TAKING CARE OF THIS PATIENT: 36 minutes.    Note: This dictation was prepared with Dragon dictation along with smaller phrase technology. Any transcriptional errors that result from this process are unintentional.  Jai Steil M.D on 08/05/2015 at 11:22 AM  Between 7am to 6pm - Pager -  [phone] After 6pm go to [email or web address] - Social research officer, governmentpassword EPAS [facility]  [facility]  Office  [phone]  CC: Primary care physician; WHITE, Valentina ShaggyHRISTINA M, FNP

## 2015-08-05 NOTE — Progress Notes (Signed)
Clinical Social Worker (CSW) discussed case with attending MD. Patient will have to be cleared by Nephrology before being discharged. CSW updated [facility] admissions coordinator at [facility]. CSW will continue to follow and assist as needed.   Jetta LoutBailey Morgan, LCSW [phone]

## 2015-08-05 NOTE — Care Management (Signed)
Day 7. Little creatinine improvement; weakness. I have asked [facility] to review case to see if patient is appropriate. I will follow up with patient.

## 2015-08-05 NOTE — Progress Notes (Signed)
Per RN Case Manager patient will D/C to [facility] today. Clinical Child psychotherapistocial Worker (CSW) made [facility] admissions Nurse, adultcoordinator at [facility] aware of above. Please reconsult if future social work needs arise. CSW signing off.   Jetta LoutBailey Morgan, LCSW [phone]) [phone]

## 2015-08-05 NOTE — Progress Notes (Signed)
Subjective:  Creatinine currently down to 3.54 from 4.4. It appears that she will be transferring to [facility].     Objective:  Vital signs in last 24 hours:  Temp:  [98 F (36.7 C)-98.6 F (37 C)] 98.5 F (36.9 C) (05/03 0739) Pulse Rate:  [79-92] 92 (05/03 0739) Resp:  [19-20] 20 (05/03 0739) BP: (114-137)/(58-75) 137/68 mmHg (05/03 0739) SpO2:  [97 %-100 %] 100 % (05/03 0739) Weight:  [172.367 kg (380 lb)] 172.367 kg (380 lb) (05/03 0316)  Weight change: -1.361 kg (-3 lb) Filed Weights   08/03/15 0500 08/04/15 0423 08/05/15 0316  Weight: 173.728 kg (383 lb) 173.728 kg (383 lb) 172.367 kg (380 lb)    Intake/Output:    Intake/Output Summary (Last 24 hours) at 08/05/15 1215 Last data filed at 08/05/15 1137  Gross per 24 hour  Intake 3640.08 ml  Output   3350 ml  Net 290.08 ml     Physical Exam: General:  laying in bed, NAD  HEENT  Anicteric, moist oral mucous membranes   Neck  supple  Pulm/lungs  coarse breath sounds, normal effort  CVS/Heart  regular, no rubs  Abdomen:  Soft, NT, non distended, obese  Extremities: + dependent edema  Neurologic: Alert, able to follow commands   Skin: No acute rashes  Access: Rt IJ vascath (4/26) Dr Dema Severin       Basic Metabolic Panel:   Recent Labs Lab 07/29/15 2244 07/30/15 0335  08/01/15 0534 08/02/15 0424 08/02/15 1616 08/03/15 0914 08/04/15 0505 08/05/15 0612  NA 135 137  < > 142 142  --  139 140 140  K 4.9 3.6  < > 3.2* 3.4*  --  3.3* 3.5 3.7  CL 105 101  < > 94* 96*  --  96* 97* 100*  CO2 21* 26  < > 32 33*  --  33* 32 32  GLUCOSE 265* 204*  < > 137* 116*  --  215* 127* 142*  BUN 38* 27*  < > 34* 40*  --  43* 44* 43*  CREATININE 4.66*  4.57* 3.23*  < > 4.64* 4.84*  --  4.73* 4.42* 3.54*  CALCIUM 6.4* 6.7*  < > 7.6* 7.9*  --  7.8* 8.0* 8.0*  MG 1.6* 1.5*  --   --   --  1.7  --   --   --   PHOS 6.6* 4.8*  --  4.5  --   --   --   --   --   < > = values in this interval not  displayed.   CBC:  Recent Labs Lab 07/30/15 0335 07/31/15 0500 08/01/15 0544 08/02/15 0424 08/03/15 0914  WBC 20.1* 15.6* 15.9* 15.8* 15.3*  HGB 10.3* 8.0* 7.6* 8.5* 8.2*  HCT 32.6* 24.7* 23.3* 26.7* 26.0*  MCV 85.0 84.5 83.2 85.6 84.2  PLT 332 255 234 266 261      Microbiology:  Recent Results (from the past 720 hour(s))  Culture, blood (routine x 2)     Status: None   Collection Time: 07/29/15  5:13 PM  Result Value Ref Range Status   Specimen Description BLOOD RIGHT FATTY CASTS  Final   Special Requests BOTTLES DRAWN AEROBIC AND ANAEROBIC  1CC  Final   Culture  Setup Time   Final    GRAM POSITIVE COCCI ANAEROBIC BOTTLE ONLY CRITICAL RESULT CALLED TO, READ BACK BY AND VERIFIED WITH: JASON ROBBINS AT 2110 07/30/15 KLK.    Culture   Final  COAGULASE NEGATIVE STAPHYLOCOCCUS ANAEROBIC BOTTLE ONLY THE SIGNIFICANCE OF ISOLATING THIS ORGANISM FROM A SINGLE VENIPUNCTURE CANNOT BE PREDICTED WITHOUT FURTHER CLINICAL AND CULTURE CORRELATION. SUSCEPTIBILITIES AVAILABLE ONLY ON REQUEST.    Report Status 08/03/2015 FINAL  Final  Urine culture     Status: None   Collection Time: 07/29/15  5:13 PM  Result Value Ref Range Status   Specimen Description URINE, CATHETERIZED  Final   Special Requests NONE  Final   Culture NO GROWTH 2 DAYS  Final   Report Status 07/31/2015 FINAL  Final  Blood Culture ID Panel (Reflexed)     Status: Abnormal   Collection Time: 07/29/15  5:13 PM  Result Value Ref Range Status   Enterococcus species NOT DETECTED NOT DETECTED Final   Vancomycin resistance NOT DETECTED NOT DETECTED Final   Listeria monocytogenes NOT DETECTED NOT DETECTED Final   Staphylococcus species DETECTED (A) NOT DETECTED Final    Comment: CRITICAL RESULT CALLED TO, READ BACK BY AND VERIFIED WITH: JASON ROBBINS AT 2110 07/30/15 KLK.    Staphylococcus aureus NOT DETECTED NOT DETECTED Final   Methicillin resistance NOT DETECTED NOT DETECTED Final   Streptococcus species NOT  DETECTED NOT DETECTED Final   Streptococcus agalactiae NOT DETECTED NOT DETECTED Final   Streptococcus pneumoniae NOT DETECTED NOT DETECTED Final   Streptococcus pyogenes NOT DETECTED NOT DETECTED Final   Acinetobacter baumannii NOT DETECTED NOT DETECTED Final   Enterobacteriaceae species NOT DETECTED NOT DETECTED Final   Enterobacter cloacae complex NOT DETECTED NOT DETECTED Final   Escherichia coli NOT DETECTED NOT DETECTED Final   Klebsiella oxytoca NOT DETECTED NOT DETECTED Final   Klebsiella pneumoniae NOT DETECTED NOT DETECTED Final   Proteus species NOT DETECTED NOT DETECTED Final   Serratia marcescens NOT DETECTED NOT DETECTED Final   Carbapenem resistance NOT DETECTED NOT DETECTED Final   Haemophilus influenzae NOT DETECTED NOT DETECTED Final   Neisseria meningitidis NOT DETECTED NOT DETECTED Final   Pseudomonas aeruginosa NOT DETECTED NOT DETECTED Final   Candida albicans NOT DETECTED NOT DETECTED Final   Candida glabrata NOT DETECTED NOT DETECTED Final   Candida krusei NOT DETECTED NOT DETECTED Final   Candida parapsilosis NOT DETECTED NOT DETECTED Final   Candida tropicalis NOT DETECTED NOT DETECTED Final  Culture, blood (routine x 2)     Status: None   Collection Time: 07/29/15  5:20 PM  Result Value Ref Range Status   Specimen Description BLOOD LEFT ASSIST CONTROL  Final   Special Requests BOTTLES DRAWN AEROBIC AND ANAEROBIC 1CC  Final   Culture NO GROWTH 7 DAYS  Final   Report Status 08/05/2015 FINAL  Final  MRSA PCR Screening     Status: None   Collection Time: 07/29/15 10:42 PM  Result Value Ref Range Status   MRSA by PCR NEGATIVE NEGATIVE Final    Comment:        The GeneXpert MRSA Assay (FDA approved for NASAL specimens only), is one component of a comprehensive MRSA colonization surveillance program. It is not intended to diagnose MRSA infection nor to guide or monitor treatment for MRSA infections.   Gastrointestinal Panel by PCR , Stool     Status:  None   Collection Time: 08/03/15  1:00 PM  Result Value Ref Range Status   Campylobacter species NOT DETECTED NOT DETECTED Final   Plesimonas shigelloides NOT DETECTED NOT DETECTED Final   Salmonella species NOT DETECTED NOT DETECTED Final   Yersinia enterocolitica NOT DETECTED NOT DETECTED Final   Vibrio species NOT  DETECTED NOT DETECTED Final   Vibrio cholerae NOT DETECTED NOT DETECTED Final   Enteroaggregative E coli (EAEC) NOT DETECTED NOT DETECTED Final   Enteropathogenic E coli (EPEC) NOT DETECTED NOT DETECTED Final   Enterotoxigenic E coli (ETEC) NOT DETECTED NOT DETECTED Final   Shiga like toxin producing E coli (STEC) NOT DETECTED NOT DETECTED Final   E. coli O157 NOT DETECTED NOT DETECTED Final   Shigella/Enteroinvasive E coli (EIEC) NOT DETECTED NOT DETECTED Final   Cryptosporidium NOT DETECTED NOT DETECTED Final   Cyclospora cayetanensis NOT DETECTED NOT DETECTED Final   Entamoeba histolytica NOT DETECTED NOT DETECTED Final   Giardia lamblia NOT DETECTED NOT DETECTED Final   Adenovirus F40/41 NOT DETECTED NOT DETECTED Final   Astrovirus NOT DETECTED NOT DETECTED Final   Norovirus GI/GII NOT DETECTED NOT DETECTED Final   Rotavirus A NOT DETECTED NOT DETECTED Final   Sapovirus (I, II, IV, and V) NOT DETECTED NOT DETECTED Final    Coagulation Studies: No results for input(s): LABPROT, INR in the last 72 hours.  Urinalysis: No results for input(s): COLORURINE, LABSPEC, PHURINE, GLUCOSEU, HGBUR, BILIRUBINUR, KETONESUR, PROTEINUR, UROBILINOGEN, NITRITE, LEUKOCYTESUR in the last 72 hours.  Invalid input(s): APPERANCEUR    Imaging: Dg Pelvis 1-2 Views  08/05/2015  CLINICAL DATA:  Left-sided pain.  Recent fall. EXAM: PELVIS - 1-2 VIEW COMPARISON:  08/03/2011 CT abdomen/ pelvis. FINDINGS: No pelvic fracture or diastasis. No evidence of malalignment at the hip joints. Minimal osteoarthritis in the weight-bearing portion of both hip joints. Degenerative changes in the visualized  lower lumbar spine. No suspicious focal osseous lesion. Central venous catheter overlies the right groin and deep pelvis. IMPRESSION: Negative. Electronically Signed   By: Delbert Phenix M.D.   On: 08/05/2015 09:23   Ct Knee Left Wo Contrast  08/04/2015  CLINICAL DATA:  Chronic Left knee pain.  No known injury. EXAM: CT OF THE right KNEE WITHOUT CONTRAST TECHNIQUE: Multidetector CT imaging of the left knee was performed according to the standard protocol. Multiplanar CT image reconstructions were also generated. COMPARISON:  None. FINDINGS: Advanced tricompartmental degenerative changes with joint space narrowing, osteophytic spurring and subchondral cystic change. No acute fracture or osteochondral lesions. No chondrocalcinosis. Partially calcified loose bodies noted in the posterior joint space laterally. Small joint effusion. IMPRESSION: Advanced tricompartmental degenerative changes, particularly for the patient's age. Partially ossified loose bodies. Small joint effusion. Electronically Signed   By: Rudie Meyer M.D.   On: 08/04/2015 11:58   US Venous Img Lower Unilateral Left  08/03/2015  CLINICAL DATA:  Left lower extremity edema and pain. EXAM: LEFT LOWER EXTREMITY VENOUS DOPPLER ULTRASOUND TECHNIQUE: Gray-scale sonography with graded compression, as well as color Doppler and duplex ultrasound were performed to evaluate the lower extremity deep venous systems from the level of the common femoral vein and including the common femoral, femoral, profunda femoral, popliteal and calf veins including the posterior tibial, peroneal and gastrocnemius veins when visible. The superficial great saphenous vein was also interrogated. Spectral Doppler was utilized to evaluate flow at rest and with distal augmentation maneuvers in the common femoral, femoral and popliteal veins. COMPARISON:  Previous bilateral lower extremity venous duplex study on 07/13/2009 FINDINGS: Contralateral Common Femoral Vein: Respiratory  phasicity is normal and symmetric with the symptomatic side. No evidence of thrombus. Normal compressibility. Common Femoral Vein: No evidence of thrombus. Normal compressibility, respiratory phasicity and response to augmentation. Saphenofemoral Junction: No evidence of thrombus. Normal compressibility and flow on color Doppler imaging. Profunda Femoral Vein: No evidence  of thrombus. Normal compressibility and flow on color Doppler imaging. Femoral Vein: No evidence of thrombus. Normal compressibility, respiratory phasicity and response to augmentation. Popliteal Vein: No evidence of thrombus. Normal compressibility, respiratory phasicity and response to augmentation. Calf Veins: No evidence of thrombus. Normal compressibility and flow on color Doppler imaging. Superficial Great Saphenous Vein: No evidence of thrombus. Normal compressibility and flow on color Doppler imaging. Venous Reflux:  None. Other Findings: No evidence of superficial thrombophlebitis or abnormal fluid collection. IMPRESSION: No evidence of left lower extremity deep venous thrombosis. Electronically Signed   By: Irish Lack M.D.   On: 08/03/2015 15:40   Dg Femur Min 2 Views Left  08/05/2015  CLINICAL DATA:  Left thigh pain.  Fall. EXAM: LEFT FEMUR 2 VIEWS COMPARISON:  None. FINDINGS: No fracture or suspicious focal osseous lesion. No evidence of malalignment at the left hip or left knee on the provided views. Tricompartmental osteoarthritis in the left knee joint, at least mild in severity. IMPRESSION: No fracture or focal osseous lesion in the left femur. Left knee tricompartmental osteoarthritis. Electronically Signed   By: Delbert Phenix M.D.   On: 08/05/2015 09:21     Medications:   . sodium chloride 65 mL/hr at 08/05/15 0941   . clonazePAM  1 mg Oral QHS  . docusate sodium  100 mg Oral BID  . heparin subcutaneous  5,000 Units Subcutaneous Q8H  . insulin aspart  0-15 Units Subcutaneous TID WC  . linagliptin  5 mg Oral Daily   . metoprolol tartrate  25 mg Oral BID  . polyethylene glycol  17 g Oral Daily  . pravastatin  20 mg Oral QHS  . sodium chloride flush  10-40 mL Intracatheter Q12H  . venlafaxine  75 mg Oral BID WC  . ziprasidone  160 mg Oral Q supper   sodium chloride, acetaminophen, albuterol, alprazolam, ipratropium-albuterol, ondansetron (ZOFRAN) IV, oxyCODONE-acetaminophen, traMADol  Assessment/ Plan:  50 y.o. female with a PMHX of morbid obesity, DM, OSA, Depression, HTN, HLD, Gastric Bypass, Cervical disc fusion, chronic pain (lumbar radiculopathy), was admitted on 07/29/2015 after being found unresponsive at home  1. ARF - Likely ATN, Patient was hypotensive At the time of presentation. Baseline Cr 0.90 (06/16/2014) Other contributing factors include metformin, ACE-i, Ibuprofen, Rhabdomyolysis 2. Severe Hyperkerkalemia due to Rhabdomyolysis (CK > 9000->16000), now hypokalemia 3. Acute respiratory failure- intubated in ER 4/26- extubated 4/28 4. Severe Acidosis, Ph 7.10->7.33 5. Altered mental status- improved 6. Morbid obesity  Plan:  Creatinine has improved significantly over the preceding 24 hours. Creatinine now down to 3.5. This may be followed at the long-term acute care facility that she is being transferred to. For now recommend continuation of IV fluid hydration. No indication for dialysis at the moment. Nephrology at the long-term acute care facility may discontinue the patient's temperature dialysis catheter if renal function continues to improve. Thanks for allowing [place] to participate in her care.    LOS: 7 Tiffany Gomez 5/3/201712:15 PM

## 2015-08-05 NOTE — Care Management (Signed)
Received call from patient's mother [facility]. I have notified Unit Dagoberto ReefClerk that packet will be needed for [facility]. MD notified of need for EMTALA. Sue LushAndrea with [facility] will meet with patient/mother today. No further RNCM needs. Case closed.

## 2015-08-05 NOTE — Progress Notes (Signed)
Dr Sheryle Hailiamond notified of change in ekg rhythm. Received order for EKG.

## 2015-08-05 NOTE — Consult Note (Signed)
ORTHOPAEDIC CONSULTATION  REQUESTING PHYSICIAN: Adrian Saran, MD  Chief Complaint: Left thigh pain   HPI: I have reviewed patient's plain films of the left femur which shows no evidence of fracture or osseous abnormality.  Continue PT as ordered.  Will sign off for now.  No orthopaedic intervention required.   Past Medical History  Diagnosis Date  . Diabetes (HCC)   . Sleep apnea   . Leg pain   . Asthma   . Depression   . Morbid obesity (HCC)   . Essential hypertension   . Hyperlipidemia   . Anxiety     Panic attacks   Past Surgical History  Procedure Laterality Date  . Gastric bypass    . Laparoscopic cholecystectomy    . Cardiac catheterization    . Anterior cervical decomp/discectomy fusion N/A 06/16/2014    Procedure: ANTERIOR CERVICAL DECOMPRESSION/DISCECTOMY FUSION CERVICAL FIVE-SIX;  Surgeon: Julio Sicks, MD;  Location: [facility];  Service: Neurosurgery;  Laterality: N/A;   Social History   Social History  . Marital Status: Single    Spouse Name: N/A  . Number of Children: N/A  . Years of Education: N/A   Social History Main Topics  . Smoking status: Never Smoker   . Smokeless tobacco: None  . Alcohol Use: No  . Drug Use: No  . Sexual Activity: Not Asked   Other Topics Concern  . None   Social History Narrative   Family History  Problem Relation Age of Onset  . Heart failure Mother    No Known Allergies Prior to Admission medications   Medication Sig Start Date End Date Taking? Authorizing Provider  albuterol (PROVENTIL HFA;VENTOLIN HFA) 108 (90 BASE) MCG/ACT inhaler Inhale 2 puffs into the lungs every 6 (six) hours as needed for wheezing or shortness of breath.   Yes Historical Provider, MD  alprazolam Prudy Feeler) 2 MG tablet Take 2 mg by mouth 4 (four) times daily as needed for anxiety.    Yes Historical Provider, MD  cyclobenzaprine (FLEXERIL) 5 MG tablet Take 5 mg by mouth every 8 (eight) hours as needed for muscle spasms.    Yes Historical  Provider, MD  gabapentin (NEURONTIN) 300 MG capsule Take 300-600 mg by mouth 3 (three) times daily as needed (for pain).   Yes Historical Provider, MD  glipiZIDE (GLUCOTROL XL) 5 MG 24 hr tablet Take 5 mg by mouth daily with breakfast.   Yes Historical Provider, MD  ibuprofen (ADVIL,MOTRIN) 800 MG tablet Take 800 mg by mouth every 8 (eight) hours as needed for headache or mild pain.    Yes Historical Provider, MD  linagliptin (TRADJENTA) 5 MG TABS tablet Take 5 mg by mouth daily.   Yes Historical Provider, MD  lisinopril (PRINIVIL,ZESTRIL) 5 MG tablet Take 5 mg by mouth daily.   Yes Historical Provider, MD  megestrol (MEGACE) 40 MG tablet Take 40 mg by mouth daily.    Yes Historical Provider, MD  metFORMIN (GLUCOPHAGE) 500 MG tablet Take 500 mg by mouth 2 (two) times daily with a meal.    Yes Historical Provider, MD  montelukast (SINGULAIR) 10 MG tablet Take 10 mg by mouth at bedtime.   Yes Historical Provider, MD  pravastatin (PRAVACHOL) 20 MG tablet Take 20 mg by mouth at bedtime.   Yes Historical Provider, MD  venlafaxine (EFFEXOR) 75 MG tablet Take 75 mg by mouth 2 (two) times daily with a meal.    Yes Historical Provider, MD  ziprasidone (GEODON) 80 MG capsule Take 160 mg  by mouth daily with supper.    Yes Historical Provider, MD  clonazePAM (KLONOPIN) 1 MG tablet Take 1 tablet (1 mg total) by mouth at bedtime. 08/05/15   Adrian SaranSital Mody, MD  insulin aspart (NOVOLOG) 100 UNIT/ML injection Inject 0-15 Units into the skin 3 (three) times daily with meals. 08/05/15   Adrian SaranSital Mody, MD  metoprolol tartrate (LOPRESSOR) 25 MG tablet Take 1 tablet (25 mg total) by mouth 2 (two) times daily. 08/05/15   Adrian SaranSital Mody, MD  Oxycodone HCl 10 MG TABS Take 1 tablet (10 mg total) by mouth 5 (five) times daily as needed (for pain). 08/05/15   Adrian SaranSital Mody, MD   Dg Pelvis 1-2 Views  08/05/2015  CLINICAL DATA:  Left-sided pain.  Recent fall. EXAM: PELVIS - 1-2 VIEW COMPARISON:  08/03/2011 CT abdomen/ pelvis. FINDINGS: No pelvic  fracture or diastasis. No evidence of malalignment at the hip joints. Minimal osteoarthritis in the weight-bearing portion of both hip joints. Degenerative changes in the visualized lower lumbar spine. No suspicious focal osseous lesion. Central venous catheter overlies the right groin and deep pelvis. IMPRESSION: Negative. Electronically Signed   By: Delbert PhenixJason A Poff M.D.   On: 08/05/2015 09:23   Ct Knee Left Wo Contrast  08/04/2015  CLINICAL DATA:  Chronic Left knee pain.  No known injury. EXAM: CT OF THE right KNEE WITHOUT CONTRAST TECHNIQUE: Multidetector CT imaging of the left knee was performed according to the standard protocol. Multiplanar CT image reconstructions were also generated. COMPARISON:  None. FINDINGS: Advanced tricompartmental degenerative changes with joint space narrowing, osteophytic spurring and subchondral cystic change. No acute fracture or osteochondral lesions. No chondrocalcinosis. Partially calcified loose bodies noted in the posterior joint space laterally. Small joint effusion. IMPRESSION: Advanced tricompartmental degenerative changes, particularly for the patient's age. Partially ossified loose bodies. Small joint effusion. Electronically Signed   By: Rudie MeyerP.  Gallerani M.D.   On: 08/04/2015 11:58   Dg Femur Min 2 Views Left  08/05/2015  CLINICAL DATA:  Left thigh pain.  Fall. EXAM: LEFT FEMUR 2 VIEWS COMPARISON:  None. FINDINGS: No fracture or suspicious focal osseous lesion. No evidence of malalignment at the left hip or left knee on the provided views. Tricompartmental osteoarthritis in the left knee joint, at least mild in severity. IMPRESSION: No fracture or focal osseous lesion in the left femur. Left knee tricompartmental osteoarthritis. Electronically Signed   By: Delbert PhenixJason A Poff M.D.   On: 08/05/2015 09:21     Juanell FairlyKRASINSKI, Norissa Bartee, MD    08/05/2015 2:56 PM

## 2015-08-05 NOTE — Care Management (Signed)
[facility] has accepted patient. I presented this to patient. She wants to talk to her mother. Patient's only concern is her mother not being able to travel that far. I explained to patient that MD is ready to discharge her but that LTAC might be a better place to monitor her renal and O2 status. Patient is calling her mother. I have updated MD and Sue LushAndrea with [facility] to see if they have a bed for today.

## 2015-08-05 NOTE — Progress Notes (Signed)
Spoke with Dr Juliene PinaMody about patient discharging, order placed to discontinue foley.

## 2015-08-05 NOTE — Progress Notes (Signed)
Received called from CCMD of change in telemetry rhythm.

## 2015-08-05 NOTE — Care Management Important Message (Signed)
Important Message  Patient Details  Name: Tiffany Gomez MRN: [id number] Date of Birth: Dec 12, 1965   Medicare Important Message Given:  Yes    Olegario MessierKathy A Crislyn Willbanks 08/05/2015, 10:05 AM

## 2015-08-12 LAB — BLOOD GAS, ARTERIAL
ALLENS TEST (PASS/FAIL): POSITIVE — AB
ALLENS TEST (PASS/FAIL): POSITIVE — AB
Acid-Base Excess: 13 mmol/L — ABNORMAL HIGH (ref 0.0–3.0)
Acid-Base Excess: 14.1 mmol/L — ABNORMAL HIGH (ref 0.0–3.0)
BICARBONATE: 38.4 meq/L — AB (ref 21.0–28.0)
Bicarbonate: 36.7 mEq/L — ABNORMAL HIGH (ref 21.0–28.0)
FIO2: 0.35
FIO2: 35
Mode: POSITIVE
O2 SAT: 98.2 %
O2 SAT: 96.9 %
PATIENT TEMPERATURE: 37
PCO2 ART: 47 mmHg (ref 32.0–48.0)
PEEP/CPAP: 5 cmH2O
PEEP: 5 cmH2O
PO2 ART: 80 mmHg — AB (ref 83.0–108.0)
PRESSURE SUPPORT: 10 cmH2O
Patient temperature: 37
RATE: 24 resp/min
VT: 500 mL
pCO2 arterial: 42 mmHg (ref 32.0–48.0)
pH, Arterial: 7.55 — ABNORMAL HIGH (ref 7.350–7.450)
pH, Arterial: 7.52 — ABNORMAL HIGH (ref 7.350–7.450)
pO2, Arterial: 94 mmHg (ref 83.0–108.0)

## 2015-08-18 ENCOUNTER — Telehealth: Payer: Self-pay | Admitting: *Deleted

## 2015-08-18 NOTE — Telephone Encounter (Signed)
pt called stating that she just got out of the hospital and is very weak. she r/s until 09/01/15@ 11:30am...td

## 2015-08-23 ENCOUNTER — Emergency Department: Payer: Medicare Other

## 2015-08-23 ENCOUNTER — Other Ambulatory Visit: Payer: Self-pay | Admitting: Pain Medicine

## 2015-08-23 ENCOUNTER — Emergency Department
Admission: EM | Admit: 2015-08-23 | Discharge: 2015-08-24 | Disposition: A | Discharge status: Home / Self Care | Payer: Medicare Other | Attending: Emergency Medicine | Admitting: Emergency Medicine

## 2015-08-23 DIAGNOSIS — Z79899 Other long term (current) drug therapy: Secondary | ICD-10-CM | POA: Diagnosis not present

## 2015-08-23 DIAGNOSIS — E785 Hyperlipidemia, unspecified: Secondary | ICD-10-CM | POA: Diagnosis not present

## 2015-08-23 DIAGNOSIS — Z794 Long term (current) use of insulin: Secondary | ICD-10-CM | POA: Insufficient documentation

## 2015-08-23 DIAGNOSIS — F329 Major depressive disorder, single episode, unspecified: Secondary | ICD-10-CM | POA: Diagnosis not present

## 2015-08-23 DIAGNOSIS — R1010 Upper abdominal pain, unspecified: Secondary | ICD-10-CM | POA: Insufficient documentation

## 2015-08-23 DIAGNOSIS — E119 Type 2 diabetes mellitus without complications: Secondary | ICD-10-CM | POA: Diagnosis not present

## 2015-08-23 DIAGNOSIS — J45909 Unspecified asthma, uncomplicated: Secondary | ICD-10-CM | POA: Insufficient documentation

## 2015-08-23 DIAGNOSIS — I10 Essential (primary) hypertension: Secondary | ICD-10-CM | POA: Diagnosis not present

## 2015-08-23 DIAGNOSIS — Z7951 Long term (current) use of inhaled steroids: Secondary | ICD-10-CM | POA: Insufficient documentation

## 2015-08-23 DIAGNOSIS — R109 Unspecified abdominal pain: Secondary | ICD-10-CM

## 2015-08-23 HISTORY — DX: Disorder of kidney and ureter, unspecified: N28.9

## 2015-08-23 LAB — PREGNANCY, URINE: Preg Test, Ur: NEGATIVE

## 2015-08-23 LAB — URINALYSIS COMPLETE WITH MICROSCOPIC (ARMC ONLY)
Bilirubin Urine: NEGATIVE
Glucose, UA: NEGATIVE mg/dL
KETONES UR: NEGATIVE mg/dL
LEUKOCYTES UA: NEGATIVE
Nitrite: NEGATIVE
PH: 8 (ref 5.0–8.0)
PROTEIN: NEGATIVE mg/dL
Specific Gravity, Urine: 1.011 (ref 1.005–1.030)

## 2015-08-23 LAB — CBC
HCT: 27.9 % — ABNORMAL LOW (ref 35.0–47.0)
HEMOGLOBIN: 8.9 g/dL — AB (ref 12.0–16.0)
MCH: 26.7 pg (ref 26.0–34.0)
MCHC: 31.8 g/dL — ABNORMAL LOW (ref 32.0–36.0)
MCV: 83.8 fL (ref 80.0–100.0)
Platelets: 579 10*3/uL — ABNORMAL HIGH (ref 150–440)
RBC: 3.33 MIL/uL — AB (ref 3.80–5.20)
RDW: 15.8 % — ABNORMAL HIGH (ref 11.5–14.5)
WBC: 11.6 10*3/uL — AB (ref 3.6–11.0)

## 2015-08-23 LAB — COMPREHENSIVE METABOLIC PANEL
ALT: 16 U/L (ref 14–54)
ANION GAP: 9 (ref 5–15)
AST: 21 U/L (ref 15–41)
Albumin: 3.6 g/dL (ref 3.5–5.0)
Alkaline Phosphatase: 54 U/L (ref 38–126)
BUN: 7 mg/dL (ref 6–20)
CHLORIDE: 104 mmol/L (ref 101–111)
CO2: 28 mmol/L (ref 22–32)
Calcium: 8.6 mg/dL — ABNORMAL LOW (ref 8.9–10.3)
Creatinine, Ser: 1.38 mg/dL — ABNORMAL HIGH (ref 0.44–1.00)
GFR calc non Af Amer: 45 mL/min — ABNORMAL LOW (ref >60)
GFR, EST AFRICAN AMERICAN: 52 mL/min — AB (ref >60)
Glucose, Bld: 119 mg/dL — ABNORMAL HIGH (ref 65–99)
POTASSIUM: 3.6 mmol/L (ref 3.5–5.1)
SODIUM: 141 mmol/L (ref 135–145)
Total Bilirubin: 0.4 mg/dL (ref 0.3–1.2)
Total Protein: 7.6 g/dL (ref 6.5–8.1)

## 2015-08-23 LAB — LIPASE, BLOOD: LIPASE: 25 U/L (ref 11–51)

## 2015-08-23 MED ORDER — SODIUM CHLORIDE 0.9 % IV SOLN: Freq: Once | INTRAVENOUS | Status: DC

## 2015-08-23 MED ORDER — DIATRIZOATE MEGLUMINE & SODIUM 66-10 % PO SOLN
15.0000 mL | Freq: Once | ORAL | Status: AC
  Administered 2015-08-23: 15 mL via ORAL

## 2015-08-23 MED ORDER — MORPHINE SULFATE (PF) 4 MG/ML IV SOLN
4.0000 mg | Freq: Once | INTRAVENOUS | Status: AC
  Administered 2015-08-23: 4 mg via INTRAVENOUS
  Filled 2015-08-23: qty 1

## 2015-08-23 MED ORDER — MORPHINE SULFATE (PF) 4 MG/ML IV SOLN
4.0000 mg | Freq: Once | INTRAVENOUS | Status: AC
Start: 2015-08-23 — End: 2015-08-23
  Administered 2015-08-23: 4 mg via INTRAVENOUS
  Filled 2015-08-23: qty 1

## 2015-08-23 MED ORDER — TRAMADOL HCL 50 MG PO TABS: 50.0000 mg | ORAL_TABLET | Freq: Four times a day (QID) | ORAL | Status: AC | PRN

## 2015-08-23 MED ORDER — IOPAMIDOL (ISOVUE-300) INJECTION 61%
125.0000 mL | Freq: Once | INTRAVENOUS | Status: AC | PRN
  Administered 2015-08-23: 125 mL via INTRAVENOUS

## 2015-08-23 MED ORDER — SODIUM CHLORIDE 0.9 % IV BOLUS (SEPSIS)
1000.0000 mL | Freq: Once | INTRAVENOUS | Status: AC
  Administered 2015-08-23: 1000 mL via INTRAVENOUS

## 2015-08-23 NOTE — Discharge Instructions (Signed)

## 2015-08-23 NOTE — ED Notes (Signed)
Pt reports generalized abd pain since Friday. Decreased appetite. Denies N/V/D. Denies fever.

## 2015-08-23 NOTE — ED Provider Notes (Signed)
IMPRESSION: 1. No acute abnormality in the abdomen/pelvis. 2. Post gastric bypass without evident complication. Post Cholecystectomy.  CT is unremarkable as dictated above. She'll be discharged with close follow-up with her primary care doctor and pain medicine.  Tiffany FilbertJonathan E Janautica Netzley, MD 08/23/15 [phone]

## 2015-08-23 NOTE — ED Provider Notes (Signed)
[facility] Emergency Department Provider Note   ____________________________________________  Time seen: Approximately 7:12 PM  I have reviewed the triage vital signs and the nursing notes.   HISTORY  Chief Complaint Abdominal Pain    HPI Tiffany Gomez is a 50 y.o. female who complains of 3 days of achy mid abdominal pain with decreased appetite. She is not nauseated does not have any vomiting or diarrhea or fever. Nothing really seems to make the pain better or worse. Pain is moderate in nature. Patient had a gastric bypass done previously. She also had an episode of sepsis last month.   Past Medical History  Diagnosis Date  . Diabetes (HCC)   . Sleep apnea   . Leg pain   . Asthma   . Depression   . Morbid obesity (HCC)   . Essential hypertension   . Hyperlipidemia   . Anxiety     Panic attacks    Patient Active Problem List   Diagnosis Date Noted  . Rhabdomyolysis 07/29/2015  . Acute renal failure (HCC)   . Hyperkalemia   . Arterial hypotension   . Respiratory failure (HCC)   . f 09/02/2014  . Lumbar radiculitis 09/02/2014  . Lumbar radiculopathy, acute 09/02/2014  . Facet syndrome, lumbar 09/02/2014  . DDD (degenerative disc disease), cervical 09/01/2014  . HNP (herniated nucleus pulposus) with myelopathy, cervical 06/16/2014  . Morbid obesity (HCC) 06/16/2014  . Cervical herniated disc 06/16/2014  . Pain in the chest 06/01/2014  . Essential hypertension   . Hyperlipidemia     Past Surgical History  Procedure Laterality Date  . Gastric bypass    . Laparoscopic cholecystectomy    . Cardiac catheterization    . Anterior cervical decomp/discectomy fusion N/A 06/16/2014    Procedure: ANTERIOR CERVICAL DECOMPRESSION/DISCECTOMY FUSION CERVICAL FIVE-SIX;  Surgeon: Julio Sicks, MD;  Location: [facility];  Service: Neurosurgery;  Laterality: N/A;    Current Outpatient Rx  Name  Route  Sig  Dispense  Refill  . albuterol  (PROVENTIL HFA;VENTOLIN HFA) 108 (90 BASE) MCG/ACT inhaler   Inhalation   Inhale 2 puffs into the lungs every 6 (six) hours as needed for wheezing or shortness of breath.         . clonazePAM (KLONOPIN) 1 MG tablet   Oral   Take 1 tablet (1 mg total) by mouth at bedtime.   30 tablet   4   . gabapentin (NEURONTIN) 300 MG capsule   Oral   Take 300-600 mg by mouth 3 (three) times daily as needed (for pain).         . insulin aspart (NOVOLOG) 100 UNIT/ML injection   Subcutaneous   Inject 0-15 Units into the skin 3 (three) times daily with meals.   10 mL   11   . linagliptin (TRADJENTA) 5 MG TABS tablet   Oral   Take 5 mg by mouth daily.         . megestrol (MEGACE) 40 MG tablet   Oral   Take 40 mg by mouth daily.       3   . metoprolol tartrate (LOPRESSOR) 25 MG tablet   Oral   Take 1 tablet (25 mg total) by mouth 2 (two) times daily.   60 tablet   0   . montelukast (SINGULAIR) 10 MG tablet   Oral   Take 10 mg by mouth at bedtime.         . Oxycodone HCl 10 MG TABS  Oral   Take 1 tablet (10 mg total) by mouth 5 (five) times daily as needed (for pain).   30 tablet   0   . pravastatin (PRAVACHOL) 20 MG tablet   Oral   Take 20 mg by mouth at bedtime.         Marland Kitchen. venlafaxine (EFFEXOR) 75 MG tablet   Oral   Take 75 mg by mouth 2 (two) times daily with a meal.       2   . ziprasidone (GEODON) 80 MG capsule   Oral   Take 160 mg by mouth daily with supper.       2     Allergies Review of patient's allergies indicates no known allergies.  Family History  Problem Relation Age of Onset  . Heart failure Mother     Social History Social History  Substance Use Topics  . Smoking status: Never Smoker   . Smokeless tobacco: Not on file  . Alcohol Use: No    Review of Systems Constitutional: No fever/chills Eyes: No visual changes. ENT: No sore throat. Cardiovascular: Denies chest pain. Respiratory: Denies shortness of breath. Gastrointestinal:  See history of present illness Genitourinary: Negative for dysuria. Musculoskeletal: Negative for back pain. Skin: Negative for rash. Neurological: Negative for headaches, focal weakness or numbness.  10-point ROS otherwise negative.  ____________________________________________   PHYSICAL EXAM:  VITAL SIGNS: ED Triage Vitals  Enc Vitals Group     BP 08/23/15 1719 143/88 mmHg     Pulse Rate 08/23/15 1719 111     Resp 08/23/15 1719 20     Temp 08/23/15 1719 98.7 F (37.1 C)     Temp Source 08/23/15 1719 Oral     SpO2 08/23/15 1719 99 %     Weight 08/23/15 1719 383 lb (173.728 kg)     Height 08/23/15 1719 5\' 6"  (1.676 m)     Head Cir --      Peak Flow --      Pain Score 08/23/15 1721 7     Pain Loc --      Pain Edu? --      Excl. in GC? --     Constitutional: Alert and oriented. Well appearing and in no acute distress. Eyes: Conjunctivae are normal. PERRL. EOMI. Head: Atraumatic. Nose: No congestion/rhinnorhea. Mouth/Throat: Mucous membranes are moist.  Oropharynx non-erythematous. Neck: No stridor.   Cardiovascular: Normal rate, regular rhythm. Grossly normal heart sounds.  Good peripheral circulation. Respiratory: Normal respiratory effort.  No retractions. Lungs CTAB. Gastrointestinal: Soft Tender to palpation and percussion in the mid upper abdomen above the umbilicus. Sounds are not heard. No distention. No abdominal bruits. No CVA tenderness. Musculoskeletal: No lower extremity tenderness nor edema.  No joint effusions. Neurologic:  Normal speech and language. No gross focal neurologic deficits are appreciated. No gait instability. Skin:  Skin is warm, dry and intact. No rash noted. Psychiatric: Mood and affect are normal. Speech and behavior are normal.  ____________________________________________   LABS (all labs ordered are listed, but only abnormal results are displayed)  Labs Reviewed  COMPREHENSIVE METABOLIC PANEL - Abnormal; Notable for the following:     Glucose, Bld 119 (*)    Creatinine, Ser 1.38 (*)    Calcium 8.6 (*)    GFR calc non Af Amer 45 (*)    GFR calc Af Amer 52 (*)    All other components within normal limits  CBC - Abnormal; Notable for the following:    WBC 11.6 (*)  RBC 3.33 (*)    Hemoglobin 8.9 (*)    HCT 27.9 (*)    MCHC 31.8 (*)    RDW 15.8 (*)    Platelets 579 (*)    All other components within normal limits  URINALYSIS COMPLETEWITH MICROSCOPIC ([facility] ONLY) - Abnormal; Notable for the following:    Color, Urine YELLOW (*)    APPearance HAZY (*)    Hgb urine dipstick 1+ (*)    Bacteria, UA RARE (*)    Squamous Epithelial / LPF 6-30 (*)    All other components within normal limits  LIPASE, BLOOD  PREGNANCY, URINE   ____________________________________________  EKG  ____________________________________________  RADIOLOGY   ____________________________________________   PROCEDURES   ____________________________________________   INITIAL IMPRESSION / ASSESSMENT AND PLAN / ED COURSE  Pertinent labs & imaging results that were available during my care of the patient were reviewed by me and considered in my medical decision making (see chart for details).  Patient is still drinking for CT for her abdominal pain. We'll sign the patient out to Dr. Mayford Knife ____________________________________________   FINAL CLINICAL IMPRESSION(S) / ED DIAGNOSES  Final diagnoses:  Abdominal pain, unspecified abdominal location      NEW MEDICATIONS STARTED DURING THIS VISIT:  New Prescriptions   No medications on file     Note:  This document was prepared using Dragon voice recognition software and may include unintentional dictation errors.    Arnaldo Natal, MD 08/23/15 2112

## 2015-08-24 MED ORDER — TRAMADOL HCL 50 MG PO TABS: 50.0000 mg | ORAL_TABLET | Freq: Four times a day (QID) | ORAL | Status: AC | PRN

## 2015-08-25 ENCOUNTER — Ambulatory Visit: Payer: Medicare Other | Admitting: Pain Medicine

## 2015-08-30 ENCOUNTER — Inpatient Hospital Stay
Admission: EM | Admit: 2015-08-30 | Discharge: 2015-09-02 | DRG: 683 | Disposition: A | Discharge status: Home Health | Payer: Medicare Other | Attending: Internal Medicine | Admitting: Internal Medicine

## 2015-08-30 ENCOUNTER — Encounter: Payer: Self-pay | Admitting: Emergency Medicine

## 2015-08-30 DIAGNOSIS — G4733 Obstructive sleep apnea (adult) (pediatric): Secondary | ICD-10-CM | POA: Diagnosis present

## 2015-08-30 DIAGNOSIS — I959 Hypotension, unspecified: Secondary | ICD-10-CM | POA: Diagnosis present

## 2015-08-30 DIAGNOSIS — N179 Acute kidney failure, unspecified: Principal | ICD-10-CM | POA: Diagnosis present

## 2015-08-30 DIAGNOSIS — F410 Panic disorder [episodic paroxysmal anxiety] without agoraphobia: Secondary | ICD-10-CM | POA: Diagnosis present

## 2015-08-30 DIAGNOSIS — Z7951 Long term (current) use of inhaled steroids: Secondary | ICD-10-CM

## 2015-08-30 DIAGNOSIS — G8929 Other chronic pain: Secondary | ICD-10-CM | POA: Diagnosis present

## 2015-08-30 DIAGNOSIS — E785 Hyperlipidemia, unspecified: Secondary | ICD-10-CM | POA: Diagnosis present

## 2015-08-30 DIAGNOSIS — I129 Hypertensive chronic kidney disease with stage 1 through stage 4 chronic kidney disease, or unspecified chronic kidney disease: Secondary | ICD-10-CM | POA: Diagnosis present

## 2015-08-30 DIAGNOSIS — E876 Hypokalemia: Secondary | ICD-10-CM | POA: Diagnosis present

## 2015-08-30 DIAGNOSIS — E119 Type 2 diabetes mellitus without complications: Secondary | ICD-10-CM

## 2015-08-30 DIAGNOSIS — Z794 Long term (current) use of insulin: Secondary | ICD-10-CM | POA: Diagnosis not present

## 2015-08-30 DIAGNOSIS — M5000 Cervical disc disorder with myelopathy, unspecified cervical region: Secondary | ICD-10-CM | POA: Diagnosis present

## 2015-08-30 DIAGNOSIS — Z79891 Long term (current) use of opiate analgesic: Secondary | ICD-10-CM

## 2015-08-30 DIAGNOSIS — R197 Diarrhea, unspecified: Secondary | ICD-10-CM | POA: Diagnosis present

## 2015-08-30 DIAGNOSIS — Z9884 Bariatric surgery status: Secondary | ICD-10-CM

## 2015-08-30 DIAGNOSIS — N189 Chronic kidney disease, unspecified: Secondary | ICD-10-CM | POA: Diagnosis present

## 2015-08-30 DIAGNOSIS — Z6841 Body Mass Index (BMI) 40.0 and over, adult: Secondary | ICD-10-CM

## 2015-08-30 DIAGNOSIS — F419 Anxiety disorder, unspecified: Secondary | ICD-10-CM

## 2015-08-30 DIAGNOSIS — Z79899 Other long term (current) drug therapy: Secondary | ICD-10-CM | POA: Diagnosis not present

## 2015-08-30 DIAGNOSIS — E1122 Type 2 diabetes mellitus with diabetic chronic kidney disease: Secondary | ICD-10-CM | POA: Diagnosis present

## 2015-08-30 DIAGNOSIS — Z9989 Dependence on other enabling machines and devices: Secondary | ICD-10-CM

## 2015-08-30 DIAGNOSIS — A084 Viral intestinal infection, unspecified: Secondary | ICD-10-CM | POA: Diagnosis present

## 2015-08-30 DIAGNOSIS — F329 Major depressive disorder, single episode, unspecified: Secondary | ICD-10-CM

## 2015-08-30 LAB — CBC
HCT: 33.3 % — ABNORMAL LOW (ref 35.0–47.0)
Hemoglobin: 10.5 g/dL — ABNORMAL LOW (ref 12.0–16.0)
MCH: 25.8 pg — ABNORMAL LOW (ref 26.0–34.0)
MCHC: 31.5 g/dL — ABNORMAL LOW (ref 32.0–36.0)
MCV: 82 fL (ref 80.0–100.0)
PLATELETS: 445 10*3/uL — AB (ref 150–440)
RBC: 4.06 MIL/uL (ref 3.80–5.20)
RDW: 15.9 % — AB (ref 11.5–14.5)
WBC: 14.8 10*3/uL — AB (ref 3.6–11.0)

## 2015-08-30 LAB — URINALYSIS COMPLETE WITH MICROSCOPIC (ARMC ONLY)
BILIRUBIN URINE: NEGATIVE
GLUCOSE, UA: NEGATIVE mg/dL
Hgb urine dipstick: NEGATIVE
KETONES UR: NEGATIVE mg/dL
LEUKOCYTES UA: NEGATIVE
NITRITE: NEGATIVE
Protein, ur: 30 mg/dL — AB
SPECIFIC GRAVITY, URINE: 1.015 (ref 1.005–1.030)
pH: 5 (ref 5.0–8.0)

## 2015-08-30 LAB — COMPREHENSIVE METABOLIC PANEL
ALK PHOS: 63 U/L (ref 38–126)
ALT: 102 U/L — AB (ref 14–54)
AST: 81 U/L — ABNORMAL HIGH (ref 15–41)
Albumin: 4.1 g/dL (ref 3.5–5.0)
Anion gap: 13 (ref 5–15)
BUN: 19 mg/dL (ref 6–20)
CO2: 24 mmol/L (ref 22–32)
CREATININE: 2.72 mg/dL — AB (ref 0.44–1.00)
Calcium: 8.7 mg/dL — ABNORMAL LOW (ref 8.9–10.3)
Chloride: 98 mmol/L — ABNORMAL LOW (ref 101–111)
GFR, EST AFRICAN AMERICAN: 23 mL/min — AB (ref >60)
GFR, EST NON AFRICAN AMERICAN: 20 mL/min — AB (ref >60)
Glucose, Bld: 143 mg/dL — ABNORMAL HIGH (ref 65–99)
POTASSIUM: 3.1 mmol/L — AB (ref 3.5–5.1)
Sodium: 135 mmol/L (ref 135–145)
TOTAL PROTEIN: 7.9 g/dL (ref 6.5–8.1)
Total Bilirubin: 0.7 mg/dL (ref 0.3–1.2)

## 2015-08-30 LAB — GLUCOSE, CAPILLARY: Glucose-Capillary: 109 mg/dL — ABNORMAL HIGH (ref 65–99)

## 2015-08-30 LAB — LIPASE, BLOOD: Lipase: 41 U/L (ref 11–51)

## 2015-08-30 MED ORDER — PRAVASTATIN SODIUM 20 MG PO TABS
20.0000 mg | ORAL_TABLET | Freq: Every day | ORAL | Status: DC
  Administered 2015-08-30 – 2015-09-01 (×3): 20 mg via ORAL
  Filled 2015-08-30 (×3): qty 1

## 2015-08-30 MED ORDER — ACETAMINOPHEN 650 MG RE SUPP
650.0000 mg | Freq: Four times a day (QID) | RECTAL | Status: DC | PRN
Start: 2015-08-30 — End: 2015-09-02

## 2015-08-30 MED ORDER — BISACODYL 5 MG PO TBEC: 5.0000 mg | DELAYED_RELEASE_TABLET | Freq: Every day | ORAL | Status: DC | PRN

## 2015-08-30 MED ORDER — VENLAFAXINE HCL 37.5 MG PO TABS
75.0000 mg | ORAL_TABLET | Freq: Two times a day (BID) | ORAL | Status: DC
  Administered 2015-08-31 – 2015-09-02 (×5): 75 mg via ORAL
  Filled 2015-08-30 (×5): qty 2

## 2015-08-30 MED ORDER — POTASSIUM CHLORIDE CRYS ER 20 MEQ PO TBCR
40.0000 meq | EXTENDED_RELEASE_TABLET | Freq: Once | ORAL | Status: AC
  Administered 2015-08-30: 40 meq via ORAL
  Filled 2015-08-30: qty 2

## 2015-08-30 MED ORDER — ONDANSETRON HCL 4 MG/2ML IJ SOLN
4.0000 mg | Freq: Four times a day (QID) | INTRAMUSCULAR | Status: DC | PRN
  Filled 2015-08-30: qty 2

## 2015-08-30 MED ORDER — ONDANSETRON HCL 4 MG PO TABS
4.0000 mg | ORAL_TABLET | Freq: Four times a day (QID) | ORAL | Status: DC | PRN
Start: 2015-08-30 — End: 2015-09-02

## 2015-08-30 MED ORDER — LINAGLIPTIN 5 MG PO TABS
5.0000 mg | ORAL_TABLET | Freq: Every day | ORAL | Status: DC
  Administered 2015-08-31: 5 mg via ORAL
  Filled 2015-08-30: qty 1

## 2015-08-30 MED ORDER — MEGESTROL ACETATE 40 MG PO TABS
40.0000 mg | ORAL_TABLET | Freq: Every day | ORAL | Status: DC
  Administered 2015-08-31: 40 mg via ORAL
  Filled 2015-08-30 (×2): qty 1

## 2015-08-30 MED ORDER — INSULIN ASPART 100 UNIT/ML ~~LOC~~ SOLN
0.0000 [IU] | Freq: Three times a day (TID) | SUBCUTANEOUS | Status: DC
  Administered 2015-08-31 – 2015-09-01 (×2): 2 [IU] via SUBCUTANEOUS
  Filled 2015-08-30 (×2): qty 2

## 2015-08-30 MED ORDER — ENOXAPARIN SODIUM 40 MG/0.4ML ~~LOC~~ SOLN
40.0000 mg | Freq: Two times a day (BID) | SUBCUTANEOUS | Status: DC
  Administered 2015-08-30 – 2015-09-02 (×6): 40 mg via SUBCUTANEOUS
  Filled 2015-08-30 (×6): qty 0.4

## 2015-08-30 MED ORDER — ZOLPIDEM TARTRATE 5 MG PO TABS
5.0000 mg | ORAL_TABLET | Freq: Every evening | ORAL | Status: DC | PRN
Start: 2015-08-30 — End: 2015-09-02

## 2015-08-30 MED ORDER — MONTELUKAST SODIUM 10 MG PO TABS
10.0000 mg | ORAL_TABLET | ORAL | Status: DC
  Administered 2015-08-31 – 2015-09-02 (×3): 10 mg via ORAL
  Filled 2015-08-30 (×3): qty 1

## 2015-08-30 MED ORDER — GLIPIZIDE ER 5 MG PO TB24
5.0000 mg | ORAL_TABLET | Freq: Every day | ORAL | Status: DC
  Administered 2015-08-31: 5 mg via ORAL
  Filled 2015-08-30 (×2): qty 1

## 2015-08-30 MED ORDER — ALPRAZOLAM 1 MG PO TABS
1.0000 mg | ORAL_TABLET | Freq: Two times a day (BID) | ORAL | Status: DC | PRN
Start: 2015-08-30 — End: 2015-09-02

## 2015-08-30 MED ORDER — ZIPRASIDONE HCL 80 MG PO CAPS
160.0000 mg | ORAL_CAPSULE | Freq: Every day | ORAL | Status: DC
  Administered 2015-08-30 – 2015-09-01 (×3): 160 mg via ORAL
  Filled 2015-08-30 (×4): qty 2

## 2015-08-30 MED ORDER — SODIUM CHLORIDE 0.9 % IV BOLUS (SEPSIS)
1000.0000 mL | Freq: Once | INTRAVENOUS | Status: AC
  Administered 2015-08-30: 1000 mL via INTRAVENOUS

## 2015-08-30 MED ORDER — CLONAZEPAM 1 MG PO TABS: 1.0000 mg | ORAL_TABLET | Freq: Every day | ORAL | Status: DC

## 2015-08-30 MED ORDER — INSULIN ASPART 100 UNIT/ML ~~LOC~~ SOLN: 0.0000 [IU] | Freq: Every day | SUBCUTANEOUS | Status: DC

## 2015-08-30 MED ORDER — OXYCODONE HCL 5 MG PO TABS
10.0000 mg | ORAL_TABLET | Freq: Three times a day (TID) | ORAL | Status: DC | PRN
Start: 2015-08-30 — End: 2015-08-31
  Administered 2015-08-30 – 2015-08-31 (×2): 10 mg via ORAL
  Filled 2015-08-30 (×2): qty 2

## 2015-08-30 MED ORDER — ACETAMINOPHEN 325 MG PO TABS: 650.0000 mg | ORAL_TABLET | Freq: Four times a day (QID) | ORAL | Status: DC | PRN

## 2015-08-30 MED ORDER — GABAPENTIN 100 MG PO CAPS: 100.0000 mg | ORAL_CAPSULE | Freq: Three times a day (TID) | ORAL | Status: DC | PRN

## 2015-08-30 MED ORDER — SODIUM CHLORIDE 0.9 % IV SOLN
INTRAVENOUS | Status: DC
  Administered 2015-08-30 – 2015-09-02 (×6): via INTRAVENOUS

## 2015-08-30 NOTE — ED Notes (Signed)
Patient presents to the ED stating that her doctor instructed her to come to the ED due to high creatinine levels and unrelenting abdominal pain.  Patient is alert and oriented at this time.  No obvious distress.  Patient denies vomiting but reports 5-6 episodes of diarrhea in the past 24 hours.  Patient states she has been having diarrhea anytime she eats or drinks x 1 week.

## 2015-08-30 NOTE — ED Provider Notes (Signed)
[facility] Emergency Department Provider Note   ____________________________________________  Time seen: Approximately 7 PM  I have reviewed the triage vital signs and the nursing notes.   HISTORY  Chief Complaint Abnormal Lab and Abdominal Pain   HPI Tiffany Gomez is a 50 y.o. female with a recent history of kidney failure was presenting to the emergency department with 1 week of diarrhea. She denies any blood in her stool. Says that she has diffuse abdominal cramping when she stools. She was recently admitted for kidney failure and was on temporary dialysis and is off now. Was called by one of her outpatient physicians today because of new onset renal failure. Denies any recent antibiotics. Denies any pain at this time. Says that every time she is she has diarrhea. Says that the episodes are about 3 times per day.   Past Medical History  Diagnosis Date  . Diabetes (HCC)   . Sleep apnea   . Leg pain   . Asthma   . Depression   . Morbid obesity (HCC)   . Essential hypertension   . Hyperlipidemia   . Anxiety     Panic attacks  . Renal insufficiency     Patient Active Problem List   Diagnosis Date Noted  . Rhabdomyolysis 07/29/2015  . Acute renal failure (HCC)   . Hyperkalemia   . Arterial hypotension   . Respiratory failure (HCC)   . f 09/02/2014  . Lumbar radiculitis 09/02/2014  . Lumbar radiculopathy, acute 09/02/2014  . Facet syndrome, lumbar 09/02/2014  . DDD (degenerative disc disease), cervical 09/01/2014  . HNP (herniated nucleus pulposus) with myelopathy, cervical 06/16/2014  . Morbid obesity (HCC) 06/16/2014  . Cervical herniated disc 06/16/2014  . Pain in the chest 06/01/2014  . Essential hypertension   . Hyperlipidemia     Past Surgical History  Procedure Laterality Date  . Gastric bypass    . Laparoscopic cholecystectomy    . Cardiac catheterization    . Anterior cervical decomp/discectomy fusion N/A 06/16/2014    Procedure: ANTERIOR CERVICAL DECOMPRESSION/DISCECTOMY FUSION CERVICAL FIVE-SIX;  Surgeon: Julio Sicks, MD;  Location: [facility];  Service: Neurosurgery;  Laterality: N/A;    Current Outpatient Rx  Name  Route  Sig  Dispense  Refill  . albuterol (PROVENTIL HFA;VENTOLIN HFA) 108 (90 BASE) MCG/ACT inhaler   Inhalation   Inhale 2 puffs into the lungs every 6 (six) hours as needed for wheezing or shortness of breath.         . clonazePAM (KLONOPIN) 1 MG tablet   Oral   Take 1 tablet (1 mg total) by mouth at bedtime.   30 tablet   4   . gabapentin (NEURONTIN) 300 MG capsule   Oral   Take 300-600 mg by mouth 3 (three) times daily as needed (for pain).         . insulin aspart (NOVOLOG) 100 UNIT/ML injection   Subcutaneous   Inject 0-15 Units into the skin 3 (three) times daily with meals.   10 mL   11   . linagliptin (TRADJENTA) 5 MG TABS tablet   Oral   Take 5 mg by mouth daily.         . megestrol (MEGACE) 40 MG tablet   Oral   Take 40 mg by mouth daily.       3   . metoprolol tartrate (LOPRESSOR) 25 MG tablet   Oral   Take 1 tablet (25 mg total) by mouth 2 (  two) times daily.   60 tablet   0   . montelukast (SINGULAIR) 10 MG tablet   Oral   Take 10 mg by mouth at bedtime.         . Oxycodone HCl 10 MG TABS   Oral   Take 1 tablet (10 mg total) by mouth 5 (five) times daily as needed (for pain).   30 tablet   0   . pravastatin (PRAVACHOL) 20 MG tablet   Oral   Take 20 mg by mouth at bedtime.         . traMADol (ULTRAM) 50 MG tablet   Oral   Take 1 tablet (50 mg total) by mouth every 6 (six) hours as needed.   20 tablet   0   . traMADol (ULTRAM) 50 MG tablet   Oral   Take 1 tablet (50 mg total) by mouth every 6 (six) hours as needed.   12 tablet   0   . venlafaxine (EFFEXOR) 75 MG tablet   Oral   Take 75 mg by mouth 2 (two) times daily with a meal.       2   . ziprasidone (GEODON) 80 MG capsule   Oral   Take 160 mg by mouth daily with  supper.       2     Allergies Review of patient's allergies indicates no known allergies.  Family History  Problem Relation Age of Onset  . Heart failure Mother     Social History Social History  Substance Use Topics  . Smoking status: Never Smoker   . Smokeless tobacco: None  . Alcohol Use: No    Review of Systems Constitutional: No fever/chills Eyes: No visual changes. ENT: No sore throat. Cardiovascular: Denies chest pain. Respiratory: Denies shortness of breath. Gastrointestinal:   No constipation. Genitourinary: Negative for dysuria. Musculoskeletal: Negative for back pain. Skin: Negative for rash. Neurological: Negative for headaches, focal weakness or numbness.  10-point ROS otherwise negative.  ____________________________________________   PHYSICAL EXAM:  VITAL SIGNS: ED Triage Vitals  Enc Vitals Group     BP 08/30/15 1805 123/72 mmHg     Pulse Rate 08/30/15 1805 116     Resp 08/30/15 1805 20     Temp 08/30/15 1805 98.2 F (36.8 C)     Temp Source 08/30/15 1805 Oral     SpO2 08/30/15 1805 97 %     Weight 08/30/15 1805 362 lb (164.202 kg)     Height 08/30/15 1805 5\' 6"  (1.676 m)     Head Cir --      Peak Flow --      Pain Score 08/30/15 1805 7     Pain Loc --      Pain Edu? --      Excl. in GC? --     Constitutional: Alert and oriented. Well appearing and in no acute distress. Eyes: Conjunctivae are normal. PERRL. EOMI. Head: Atraumatic. Nose: No congestion/rhinnorhea. Mouth/Throat: Mucous membranes are moist.   Neck: No stridor.   Cardiovascular: Normal rate, regular rhythm. Grossly normal heart sounds.   Respiratory: Normal respiratory effort.  No retractions. Lungs CTAB. Gastrointestinal: Soft and nontender. No distention.  Musculoskeletal: Mild bilateral lower extremity edema.  No joint effusions. Neurologic:  Normal speech and language. No gross focal neurologic deficits are appreciated.  Skin:  Skin is warm, dry and intact. No rash  noted. Psychiatric: Mood and affect are normal. Speech and behavior are normal.  ____________________________________________   LABS (all  labs ordered are listed, but only abnormal results are displayed)  Labs Reviewed  COMPREHENSIVE METABOLIC PANEL - Abnormal; Notable for the following:    Potassium 3.1 (*)    Chloride 98 (*)    Glucose, Bld 143 (*)    Creatinine, Ser 2.72 (*)    Calcium 8.7 (*)    AST 81 (*)    ALT 102 (*)    GFR calc non Af Amer 20 (*)    GFR calc Af Amer 23 (*)    All other components within normal limits  CBC - Abnormal; Notable for the following:    WBC 14.8 (*)    Hemoglobin 10.5 (*)    HCT 33.3 (*)    MCH 25.8 (*)    MCHC 31.5 (*)    RDW 15.9 (*)    Platelets 445 (*)    All other components within normal limits  LIPASE, BLOOD  URINALYSIS COMPLETEWITH MICROSCOPIC ([facility] ONLY)   ____________________________________________  EKG  ED ECG REPORT I, Arelia Longest, the attending physician, personally viewed and interpreted this ECG.   Date: 08/30/2015  EKG Time: 1821  Rate: 1:15  Rhythm: sinus tachycardia  Axis: Normal  Intervals:none  ST&T Change: No ST segment elevation or depression. No abnormal T-wave inversion.  ____________________________________________  RADIOLOGY   ____________________________________________   PROCEDURES  ____________________________________________   INITIAL IMPRESSION / ASSESSMENT AND PLAN / ED COURSE  Pertinent labs & imaging results that were available during my care of the patient were reviewed by me and considered in my medical decision making (see chart for details).  ----------------------------------------- 7:23 PM on 08/30/2015 -----------------------------------------  Patient with recurrent renal failure. Will hydrate. Feel that this is likely related to dehydration. We'll also test stool for C. difficile. Patient with recent hospitalization which is a risk factor for this type of  infection. Signed out to the hospitalist. ____________________________________________   FINAL CLINICAL IMPRESSION(S) / ED DIAGNOSES  Diarrhea. Acute renal failure.    NEW MEDICATIONS STARTED DURING THIS VISIT:  New Prescriptions   No medications on file     Note:  This document was prepared using Dragon voice recognition software and may include unintentional dictation errors.    Myrna Blazer, MD 08/30/15 [phone]

## 2015-08-30 NOTE — H&P (Signed)
PCP:  [facility]  Chief Complaint:  Diarrhea  HPI: This is a 50 year old female who is morbidly obese with multiple medical issues. She states that she's developed diarrhea last week Sunday. She was here in the ER on Sunday, treated and discharged. She states since then her diarrhea has persisted having 4-5 episodes daily. She has intermittent abdominal cramping all over. When she has pain at 7/10 at its worst. She denies any nausea or vomiting. She denies any burning urination. She states she's recently become bit lightheaded. She denies any altered mentation or blood in her stool. She denies any new medications or any recent use of antibiotics. She denies any fevers or chills. She followed up with her PCP and he did labs. He called her back and told her to come to the ER as her creatinine was increased and he did not want to go into renal failure again. The patient states that her urine output is unchanged.  Review of Systems:  The patient denies anorexia, fever, weight loss,, vision loss, decreased hearing, hoarseness, chest pain, syncope, dyspnea on exertion, peripheral edema, balance deficits, hemoptysis, abdominal pain, diarrhea, melena, hematochezia, severe indigestion/heartburn, hematuria, incontinence, genital sores, muscle weakness, suspicious skin lesions, transient blindness, difficulty walking, depression, unusual weight change, abnormal bleeding, enlarged lymph nodes, angioedema, and breast masses.  Past Medical History: Past Medical History  Diagnosis Date  . Diabetes (HCC)   . Sleep apnea   . Leg pain   . Asthma   . Depression   . Morbid obesity (HCC)   . Essential hypertension   . Hyperlipidemia   . Anxiety     Panic attacks  . Renal insufficiency    Past Surgical History  Procedure Laterality Date  . Gastric bypass    . Laparoscopic cholecystectomy    . Cardiac catheterization    . Anterior cervical decomp/discectomy fusion N/A 06/16/2014    Procedure: ANTERIOR  CERVICAL DECOMPRESSION/DISCECTOMY FUSION CERVICAL FIVE-SIX;  Surgeon: Julio Sicks, MD;  Location: [facility];  Service: Neurosurgery;  Laterality: N/A;    Medications: Prior to Admission medications   Medication Sig Start Date End Date Taking? Authorizing Provider  albuterol (PROVENTIL HFA;VENTOLIN HFA) 108 (90 BASE) MCG/ACT inhaler Inhale 2 puffs into the lungs every 6 (six) hours as needed for wheezing or shortness of breath.   Yes Historical Provider, MD  alprazolam Prudy Feeler) 2 MG tablet Take 2 mg by mouth See admin instructions. Take 1 tablet by mouth 3 to 4 times a day as needed anxiety. 08/05/15  Yes Historical Provider, MD  clonazePAM (KLONOPIN) 1 MG tablet Take 1 tablet (1 mg total) by mouth at bedtime. 08/05/15  Yes Adrian Saran, MD  gabapentin (NEURONTIN) 300 MG capsule Take 300-600 mg by mouth 3 (three) times daily as needed (for pain).   Yes Historical Provider, MD  glipiZIDE (GLUCOTROL XL) 5 MG 24 hr tablet Take 5 mg by mouth daily. 08/20/15  Yes Historical Provider, MD  hydrochlorothiazide (MICROZIDE) 12.5 MG capsule Take 12.5 mg by mouth daily. 08/20/15  Yes Historical Provider, MD  insulin aspart (NOVOLOG) 100 UNIT/ML injection Inject 0-15 Units into the skin 3 (three) times daily with meals. 08/05/15  Yes Sital Mody, MD  linagliptin (TRADJENTA) 5 MG TABS tablet Take 5 mg by mouth daily.   Yes Historical Provider, MD  megestrol (MEGACE) 40 MG tablet Take 40 mg by mouth daily.    Yes Historical Provider, MD  montelukast (SINGULAIR) 10 MG tablet Take 10 mg by mouth every morning.  Yes Historical Provider, MD  Oxycodone HCl 10 MG TABS Take 1 tablet (10 mg total) by mouth 5 (five) times daily as needed (for pain). 08/05/15  Yes Adrian Saran, MD  pravastatin (PRAVACHOL) 20 MG tablet Take 20 mg by mouth at bedtime.   Yes Historical Provider, MD  torsemide (DEMADEX) 10 MG tablet Take 10 mg by mouth daily. 08/19/15  Yes Historical Provider, MD  venlafaxine (EFFEXOR) 75 MG tablet Take 75 mg by mouth 2  (two) times daily with a meal.    Yes Historical Provider, MD  zaleplon (SONATA) 10 MG capsule Take 10 mg by mouth at bedtime as needed. For sleep. 07/30/15  Yes Historical Provider, MD  ziprasidone (GEODON) 80 MG capsule Take 160 mg by mouth at bedtime.    Yes Historical Provider, MD  metoprolol tartrate (LOPRESSOR) 25 MG tablet Take 1 tablet (25 mg total) by mouth 2 (two) times daily. 08/05/15   Adrian Saran, MD  traMADol (ULTRAM) 50 MG tablet Take 1 tablet (50 mg total) by mouth every 6 (six) hours as needed. 08/23/15 08/22/16  Emily Filbert, MD  traMADol (ULTRAM) 50 MG tablet Take 1 tablet (50 mg total) by mouth every 6 (six) hours as needed. 08/24/15   Rebecka Apley, MD    Allergies:  No Known Allergies  Social History:  reports that she has never smoked. She does not have any smokeless tobacco history on file. She reports that she does not drink alcohol or use illicit drugs.  Family History: Family History  Problem Relation Age of Onset  . Heart failure Mother     Physical Exam: Filed Vitals:   08/30/15 1805 08/30/15 1830 08/30/15 1900 08/30/15 1930  BP: 123/72 110/67 110/77 101/87  Pulse: 116 112 107 106  Temp: 98.2 F (36.8 C)     TempSrc: Oral     Resp: Height:  (1.676 m)     Weight: 164.202 kg (362 lb)     SpO2: 97% 97% 97% 99%    General:  Alert and oriented times three, well developed and nourished, no acute distress, Morbidly obese female Eyes: PERRLA, pink conjunctiva, no scleral icterus ENT: Moist oral mucosa, neck supple, no thyromegaly Lungs: clear to ascultation, no wheeze, no crackles, no use of accessory muscles Cardiovascular: regular rate and rhythm, no regurgitation, no gallops, no murmurs. No carotid bruits, no JVD Abdomen: soft, positive BS, non-tender, non-distended, no organomegaly, not an acute abdomen GU: not examined Neuro: CN II - XII grossly intact, sensation intact Musculoskeletal: strength 5/5 all extremities, no  clubbing, cyanosis or edema Skin: no rash, no subcutaneous crepitation, no decubitus Psych: appropriate patient   Labs on Admission:   Recent Labs  08/30/15 1809  NA 135  K 3.1*  CL 98*  CO2 24  GLUCOSE 143*  BUN 19  CREATININE 2.72*  CALCIUM 8.7*    Recent Labs  08/30/15 1809  AST 81*  ALT 102*  ALKPHOS 63  BILITOT 0.7  PROT 7.9  ALBUMIN 4.1    Recent Labs  08/30/15 1809  LIPASE 41    Recent Labs  08/30/15 1809  WBC 14.8*  HGB 10.5*  HCT 33.3*  MCV 82.0  PLT 445*   No results for input(s): CKTOTAL, CKMB, CKMBINDEX, TROPONINI in the last 72 hours. Invalid input(s): POCBNP No results for input(s): DDIMER in the last 72 hours. No results for input(s): HGBA1C in the last 72 hours. No results for input(s): CHOL, HDL, LDLCALC, TRIG,  CHOLHDL, LDLDIRECT in the last 72 hours. No results for input(s): TSH, T4TOTAL, T3FREE, THYROIDAB in the last 72 hours.  Invalid input(s): FREET3 No results for input(s): VITAMINB12, FOLATE, FERRITIN, TIBC, IRON, RETICCTPCT in the last 72 hours.  Micro Results: No results found for this or any previous visit (from the past 240 hour(s)).   Radiological Exams on Admission: No results found.  Assessment/Plan Present on Admission:  . Diarrhea -Admit to MedSurg -C. difficile toxins ordered as well as A gastrointestinal panel -Flagyl for C. difficile toxin positive  . Acute-on-chronic kidney injury (HCC) -IV fluid hydration, strict I's and O's, repeat BMP in a.m. -Hydrochlorothiazide and torsemide have been held -Neurontin decreased to renal dosing, now 100 mg by mouth 3 times a day  Hypokalemia -By mouth potassium ordered, repeat potassium noted in a.m.  Anxiety and depression -i decreased patients Ativan dose decreased to 1 mg  3 times a day PRN from 2 mg by mouth 3 times a day when necessary.  Chronic pain likely cervical disc -High decreased patients Oxycodone dose decreased from 10 mg 5 times daily to 3 times  daily. Defer to AM team to adjust the Ativan and oxycodone upwards if needed  .  hypertension  -Stable, home medications resumed -As needed blood pressure medications ordered  . Hyperlipidemia -Stable, home medications resumed  . Morbid obesity (HCC) Obstructive sleep apnea and CPAP -CPAP ordered  Diabetes mellitus type 2 -ADA diet, sliding-scale insulin -Home medications resumed   Elika Godar 08/30/2015, 8:21 PM

## 2015-08-31 LAB — GASTROINTESTINAL PANEL BY PCR, STOOL (REPLACES STOOL CULTURE)
Adenovirus F40/41: NOT DETECTED
Astrovirus: NOT DETECTED
CRYPTOSPORIDIUM: NOT DETECTED
CYCLOSPORA CAYETANENSIS: NOT DETECTED
Campylobacter species: NOT DETECTED
E. COLI O157: NOT DETECTED
ENTAMOEBA HISTOLYTICA: NOT DETECTED
Enteroaggregative E coli (EAEC): NOT DETECTED
Enteropathogenic E coli (EPEC): NOT DETECTED
Enterotoxigenic E coli (ETEC): NOT DETECTED
Giardia lamblia: NOT DETECTED
Norovirus GI/GII: NOT DETECTED
Plesimonas shigelloides: NOT DETECTED
ROTAVIRUS A: NOT DETECTED
SALMONELLA SPECIES: NOT DETECTED
SAPOVIRUS (I, II, IV, AND V): NOT DETECTED
SHIGELLA/ENTEROINVASIVE E COLI (EIEC): NOT DETECTED
Shiga like toxin producing E coli (STEC): NOT DETECTED
VIBRIO CHOLERAE: NOT DETECTED
VIBRIO SPECIES: NOT DETECTED
YERSINIA ENTEROCOLITICA: NOT DETECTED

## 2015-08-31 LAB — C DIFFICILE QUICK SCREEN W PCR REFLEX
C DIFFICILE (CDIFF) TOXIN: NEGATIVE
C Diff antigen: NEGATIVE
C Diff interpretation: NEGATIVE

## 2015-08-31 LAB — BASIC METABOLIC PANEL
ANION GAP: 10 (ref 5–15)
BUN: 20 mg/dL (ref 6–20)
CALCIUM: 8.2 mg/dL — AB (ref 8.9–10.3)
CO2: 24 mmol/L (ref 22–32)
CREATININE: 2.54 mg/dL — AB (ref 0.44–1.00)
Chloride: 100 mmol/L — ABNORMAL LOW (ref 101–111)
GFR, EST AFRICAN AMERICAN: 25 mL/min — AB (ref >60)
GFR, EST NON AFRICAN AMERICAN: 21 mL/min — AB (ref >60)
Glucose, Bld: 121 mg/dL — ABNORMAL HIGH (ref 65–99)
Potassium: 3.1 mmol/L — ABNORMAL LOW (ref 3.5–5.1)
SODIUM: 134 mmol/L — AB (ref 135–145)

## 2015-08-31 LAB — GLUCOSE, CAPILLARY
GLUCOSE-CAPILLARY: 126 mg/dL — AB (ref 65–99)
GLUCOSE-CAPILLARY: 107 mg/dL — AB (ref 65–99)
GLUCOSE-CAPILLARY: 93 mg/dL (ref 65–99)
Glucose-Capillary: 107 mg/dL — ABNORMAL HIGH (ref 65–99)

## 2015-08-31 LAB — CBC
HCT: 29.4 % — ABNORMAL LOW (ref 35.0–47.0)
HEMOGLOBIN: 9.4 g/dL — AB (ref 12.0–16.0)
MCH: 26.5 pg (ref 26.0–34.0)
MCHC: 32 g/dL (ref 32.0–36.0)
MCV: 82.7 fL (ref 80.0–100.0)
PLATELETS: 393 10*3/uL (ref 150–440)
RBC: 3.56 MIL/uL — AB (ref 3.80–5.20)
RDW: 16.1 % — ABNORMAL HIGH (ref 11.5–14.5)
WBC: 11.6 10*3/uL — AB (ref 3.6–11.0)

## 2015-08-31 MED ORDER — OXYCODONE HCL 5 MG PO TABS
10.0000 mg | ORAL_TABLET | Freq: Four times a day (QID) | ORAL | Status: DC | PRN
  Administered 2015-08-31 – 2015-09-02 (×8): 10 mg via ORAL
  Filled 2015-08-31 (×8): qty 2

## 2015-08-31 MED ORDER — LOPERAMIDE HCL 2 MG PO CAPS
2.0000 mg | ORAL_CAPSULE | Freq: Four times a day (QID) | ORAL | Status: DC | PRN
  Administered 2015-08-31 – 2015-09-01 (×2): 2 mg via ORAL
  Filled 2015-08-31 (×2): qty 1

## 2015-08-31 MED ORDER — POTASSIUM CHLORIDE CRYS ER 20 MEQ PO TBCR
40.0000 meq | EXTENDED_RELEASE_TABLET | Freq: Two times a day (BID) | ORAL | Status: AC
  Administered 2015-08-31 (×2): 40 meq via ORAL
  Filled 2015-08-31 (×2): qty 2

## 2015-08-31 NOTE — Care Management (Signed)
Patient was sent to ED by her PCP for unstable labs.  Was found to have elevated BUN and Creatinine.  Patient had an icu admission at [facility] in April 2017 and was transferred to [facility].  Patient is not quite sure of her discharge date home but says that she [facility] but not quite sure of the services. Confirmed agency is providing SN PT OT since 5.21.  Will resume services at discharge. may benefit to have SN draw labs to monitor renal status.

## 2015-09-01 ENCOUNTER — Ambulatory Visit: Payer: Medicare Other | Admitting: Pain Medicine

## 2015-09-01 ENCOUNTER — Telehealth: Payer: Self-pay | Admitting: Pain Medicine

## 2015-09-01 LAB — BASIC METABOLIC PANEL
Anion gap: 6 (ref 5–15)
BUN: 21 mg/dL — AB (ref 6–20)
CO2: 26 mmol/L (ref 22–32)
Calcium: 8.3 mg/dL — ABNORMAL LOW (ref 8.9–10.3)
Chloride: 104 mmol/L (ref 101–111)
Creatinine, Ser: 2.03 mg/dL — ABNORMAL HIGH (ref 0.44–1.00)
GFR calc Af Amer: 33 mL/min — ABNORMAL LOW (ref >60)
GFR, EST NON AFRICAN AMERICAN: 28 mL/min — AB (ref >60)
GLUCOSE: 110 mg/dL — AB (ref 65–99)
POTASSIUM: 3.6 mmol/L (ref 3.5–5.1)
Sodium: 136 mmol/L (ref 135–145)

## 2015-09-01 LAB — GLUCOSE, CAPILLARY
GLUCOSE-CAPILLARY: 101 mg/dL — AB (ref 65–99)
Glucose-Capillary: 101 mg/dL — ABNORMAL HIGH (ref 65–99)
Glucose-Capillary: 112 mg/dL — ABNORMAL HIGH (ref 65–99)
Glucose-Capillary: 122 mg/dL — ABNORMAL HIGH (ref 65–99)

## 2015-09-01 NOTE — Clinical Documentation Improvement (Signed)
Internal Medicine  Can the diagnosis of CKD be further specified? Please document findings in next progress note. Thank you!   CKD Stage I - GFR greater than or equal to 90  CKD Stage II - GFR 60-89  CKD Stage III - GFR 30-59  CKD Stage IV - GFR 15-29  CKD Stage V - GFR < 15  ESRD (End Stage Renal Disease)  Other condition  Unable to clinically determine  Supporting Information: : (risk factors, signs and symptoms, diagnostics, treatment)  Black female  GFR's for current admission are running from 23 to 33  Please exercise your independent, professional judgment when responding. A specific answer is not anticipated or expected.  Thank You, Shellee MiloEileen T Ilamae Geng RN, BSN, CCDS Health Information Management [place] [phone]; Cell: [phone]

## 2015-09-01 NOTE — Progress Notes (Signed)
Initial Nutrition Assessment  DOCUMENTATION CODES:   Morbid obesity  INTERVENTION:  -Monitor intake and cater to pt preferences -If unable to meet nutritional needs will add supplement   NUTRITION DIAGNOSIS:   Inadequate oral intake related to acute illness as evidenced by per patient/family report.    GOAL:   Patient will meet greater than or equal to 90% of their needs    MONITOR:   PO intake  REASON FOR ASSESSMENT:   Malnutrition Screening Tool    ASSESSMENT:     Pt admitted with diarrhea, acute on chronic kidney injury. Noted recent admission in 07/2015 on vent for short period of time and on temporary HD.  Noted history of gastric bypass in 2013 at [facility].    Past Medical History  Diagnosis Date  . Diabetes (HCC)   . Sleep apnea   . Leg pain   . Asthma   . Depression   . Morbid obesity (HCC)   . Essential hypertension   . Hyperlipidemia   . Anxiety     Panic attacks  . Renal insufficiency    Pt reports for the past week appetite has been decreased but has been drinking 3 boost per day.  Reports this am ate 100% of home fries and few bites of french toast.   Medications reviewed; NS at 1775ml/hr Labs reviewed: BUN 21, creatinine 2.03,  Glucose 110  Diet Order:  Diet heart healthy/carb modified Room service appropriate?: Yes; Fluid consistency:: Thin  Skin:  Reviewed, no issues  Last BM:  5/29  Height:   Ht Readings from Last 1 Encounters:  08/30/15 5\' 6"  (1.676 m)    Weight: noted wt on 5/21 of 383 pounds, current wt of 360 pounds (6% wt loss in the last month)  Wt Readings from Last 1 Encounters:  08/30/15 360 lb (163.295 kg)    Ideal Body Weight:     BMI:  Body mass index is 58.13 kg/(m^2).  Estimated Nutritional Needs:   Kcal:  2279 kcals/d  Protein:  114 g/d  Fluid:  >/=2 L/d  EDUCATION NEEDS:   No education needs identified at this time  Dastan Krider B. Freida BusmanAllen, RD, LDN [phone] (pager) Weekend/On-Call pager [phone])

## 2015-09-01 NOTE — Evaluation (Signed)
Physical Therapy Evaluation Patient Details Name: Tiffany Gomez Renna Viti MRN: [id number] DOB: 02-01-1966 Today's Date: 09/01/2015   History of Present Illness  50 y/o female here with acute-on-chronic kidney issues.  She was in the ED earlier this week with diarrhea, pt is obese.  Clinical Impression  Pt did well with PT and will be safe to return home but she does require continued PT secondary to poor activity tolerance and not being at her PLOF (pt very fatigued with the effort of 100 ft of ambulation; HR into the 140s, though O2 remains in the high 90s).  Pt willing to work with PT but lacked confidence for more prolonged ambulation.     Follow Up Recommendations Home health PT    Equipment Recommendations       Recommendations for Other Services       Precautions / Restrictions Precautions Precautions: Fall Restrictions Weight Bearing Restrictions: No      Mobility  Bed Mobility Overal bed mobility: Independent             General bed mobility comments: Pt able to get to EOB and tie her shoes w/o assist  Transfers Overall transfer level: Independent Equipment used: Straight cane             General transfer comment: Pt able to rise w/o assist, hesitancy or safety issues  Ambulation/Gait Ambulation/Gait assistance: Modified independent (Device/Increase time) Ambulation Distance (Feet): 100 Feet Assistive device: Straight cane       General Gait Details: Pt becomes fatigued with ambualtion, but ultimately shows good confidence and safety with SPC.   Stairs            Wheelchair Mobility    Modified Rankin (Stroke Patients Only)       Balance                                             Pertinent Vitals/Pain Pain Assessment:  (chronic L LE sciatic pain)    Home Living Family/patient expects to be discharged to:: Private residence Living Arrangements: Alone Available Help at Discharge: Family Type of Home:  Apartment Home Access: Level entry     Home Layout: One level Home Equipment: Cane - single point      Prior Function Level of Independence: Independent with assistive device(s)         Comments: was using SPC sometimes at home; Patient was driving and independent in all self care tasks prior to admittance;      Hand Dominance        Extremity/Trunk Assessment   Upper Extremity Assessment: Overall WFL for tasks assessed           Lower Extremity Assessment: Overall WFL for tasks assessed         Communication   Communication: No difficulties  Cognition Arousal/Alertness: Awake/alert Behavior During Therapy: WFL for tasks assessed/performed Overall Cognitive Status: Within Functional Limits for tasks assessed                      General Comments      Exercises        Assessment/Plan    PT Assessment Patient needs continued PT services  PT Diagnosis Difficulty walking;Generalized weakness   PT Problem List Decreased strength;Decreased activity tolerance;Decreased mobility  PT Treatment Interventions DME instruction;Gait training;Functional mobility training;Therapeutic activities;Therapeutic exercise;Balance training;Neuromuscular re-education   PT  Goals (Current goals can be found in the Care Plan section) Acute Rehab PT Goals Patient Stated Goal: Go home PT Goal Formulation: With patient Time For Goal Achievement: 09/15/15 Potential to Achieve Goals: Fair    Frequency Min 2X/week   Barriers to discharge        Co-evaluation               End of Session Equipment Utilized During Treatment: Gait belt Activity Tolerance: Patient tolerated treatment well;Patient limited by fatigue             Time: 1001-1024 PT Time Calculation (min) (ACUTE ONLY): 23 min   Charges:   PT Evaluation $PT Eval Low Complexity: 1 Procedure     PT G CodesMalachi Pro, DPT  09/01/2015, 1:53 PM

## 2015-09-01 NOTE — Telephone Encounter (Signed)
Patient is in [facility] hospital, has been resched to  Tues 09-08-15 at 10:30

## 2015-09-01 NOTE — Care Management Important Message (Signed)
Important Message  Patient Details  Name: Tiffany Gomez MRN: [id number] Date of Birth: 28-Jul-1965   Medicare Important Message Given:  Yes    Olegario MessierKathy A Kassondra Geil 09/01/2015, 12:53 PM

## 2015-09-01 NOTE — Progress Notes (Signed)
[facility] [place] at [facility]   PATIENT NAME: Tiffany Gomez    MR#:  [id number]  DATE OF BIRTH:  21-Aug-1965  SUBJECTIVE:  CHIEF COMPLAINT:   Chief Complaint  Patient presents with  . Abnormal Lab  . Abdominal Pain     Came with diarrhea for few days and found to have acute renal failure.   With IV fluids, renal func is slightly better, Stool studies are negative.  REVIEW OF SYSTEMS:  CONSTITUTIONAL: No fever, fatigue or weakness.  EYES: No blurred or double vision.  EARS, NOSE, AND THROAT: No tinnitus or ear pain.  RESPIRATORY: No cough, shortness of breath, wheezing or hemoptysis.  CARDIOVASCULAR: No chest pain, orthopnea, edema.  GASTROINTESTINAL: No nausea, vomiting, diarrhea or abdominal pain.  GENITOURINARY: No dysuria, hematuria.  ENDOCRINE: No polyuria, nocturia,  HEMATOLOGY: No anemia, easy bruising or bleeding SKIN: No rash or lesion. MUSCULOSKELETAL: No joint pain or arthritis.   NEUROLOGIC: No tingling, numbness, weakness.  PSYCHIATRY: No anxiety or depression.   ROS  DRUG ALLERGIES:  No Known Allergies  VITALS:  Blood pressure 98/57, pulse 103, temperature 97.9 F (36.6 C), temperature source Oral, resp. rate 17, height 5\' 6"  (1.676 m), weight 163.295 kg (360 lb), SpO2 99 %.  PHYSICAL EXAMINATION:  GENERAL:  50 y.o.-year-old obese patient lying in the bed with no acute distress.  EYES: Pupils equal, round, reactive to light and accommodation. No scleral icterus. Extraocular muscles intact.  HEENT: Head atraumatic, normocephalic. Oropharynx and nasopharynx clear.  NECK:  Supple, no jugular venous distention. No thyroid enlargement, no tenderness.  LUNGS: Normal breath sounds bilaterally, no wheezing, rales,rhonchi or crepitation. No use of accessory muscles of respiration.  CARDIOVASCULAR: S1, S2 normal. No murmurs, rubs, or gallops.  ABDOMEN: Soft, nontender, nondistended. Bowel sounds present. No organomegaly or mass.  EXTREMITIES:  No pedal edema, cyanosis, or clubbing.  NEUROLOGIC: Cranial nerves II through XII are intact. Muscle strength 5/5 in all extremities. Sensation intact. Gait not checked.  PSYCHIATRIC: The patient is alert and oriented x 3.  SKIN: No obvious rash, lesion, or ulcer.   Physical Exam LABORATORY PANEL:   CBC  Recent Labs Lab 08/31/15 0614  WBC 11.6*  HGB 9.4*  HCT 29.4*  PLT 393   ------------------------------------------------------------------------------------------------------------------  Chemistries   Recent Labs Lab 08/30/15 1809  09/01/15 0358  NA 135  < > 136  K 3.1*  < > 3.6  CL 98*  < > 104  CO2 24  < > 26  GLUCOSE 143*  < > 110*  BUN 19  < > 21*  CREATININE 2.72*  < > 2.03*  CALCIUM 8.7*  < > 8.3*  AST 81*  --   --   ALT 102*  --   --   ALKPHOS 63  --   --   BILITOT 0.7  --   --   < > = values in this interval not displayed. ------------------------------------------------------------------------------------------------------------------  Cardiac Enzymes No results for input(s): TROPONINI in the last 168 hours. ------------------------------------------------------------------------------------------------------------------  RADIOLOGY:  No results found.  ASSESSMENT AND PLAN:   Principal Problem:   Acute-on-chronic kidney injury (HCC) Active Problems:   Hyperlipidemia   Morbid obesity (HCC)   Arterial hypotension   Diarrhea   OSA on CPAP   Diabetes mellitus (HCC)   Dyslipidemia   Anxiety and depression   . Diarrhea -C. difficile toxins and gastrointestinal panel are negative - likely viral diarrhea. - give loperamide to help with diarrhea.  . Acute-on-chronic kidney  injury (HCC) -IV fluid hydration, strict I's and O's, repeat BMP in a.m. -Hydrochlorothiazide and torsemide have been held -Neurontin decreased to renal dosing, now 100 mg by mouth 3 times a day - kidney func mild improved.  Hypokalemia -By mouth potassium ordered, repeat  potassium noted in a.m.  Anxiety and depression - decreased patients Ativan dose decreased to 1 mg 3 times a day PRN from 2 mg by mouth 3 times a day when necessary.  Chronic pain likely cervical disc - cont home dose of oxycodone.  . hypertension  -Stable, home medications on hold for now.  . Hyperlipidemia -Stable, home medications resumed  . Morbid obesity (HCC) Obstructive sleep apnea and CPAP -CPAP ordered  Diabetes mellitus type 2 -ADA diet, sliding-scale insulin -Home medications on hold.    All the records are reviewed and case discussed with Care Management/Social Workerr. Management plans discussed with the patient, family and they are in agreement.  CODE STATUS: Full  TOTAL TIME TAKING CARE OF THIS PATIENT: 35 minutes.     POSSIBLE D/C IN 1-2 DAYS, DEPENDING ON CLINICAL CONDITION.   Altamese Dilling M.D on 09/01/2015   Between 7am to 6pm - Pager - [phone]  After 6pm go to [email or web address] - password [email or web address]  [facility] [place] [facility]  Office  [phone]  CC: Primary care physician; Pcp Not In System  Note: This dictation was prepared with Dragon dictation along with smaller phrase technology. Any transcriptional errors that result from this process are unintentional.

## 2015-09-01 NOTE — Progress Notes (Signed)
[facility] - [place] at [facility]   PATIENT NAME: Tiffany Gomez    MR#:  [id number]  DATE OF BIRTH:  1965/08/12  SUBJECTIVE:  CHIEF COMPLAINT:   Chief Complaint  Patient presents with  . Abnormal Lab  . Abdominal Pain     Came with diarrhea for few days and found to have acute renal failure.   With IV fluids, renal func is slightly better, Stool studies are negative.  REVIEW OF SYSTEMS:  CONSTITUTIONAL: No fever, fatigue or weakness.  EYES: No blurred or double vision.  EARS, NOSE, AND THROAT: No tinnitus or ear pain.  RESPIRATORY: No cough, shortness of breath, wheezing or hemoptysis.  CARDIOVASCULAR: No chest pain, orthopnea, edema.  GASTROINTESTINAL: No nausea, vomiting, diarrhea or abdominal pain.  GENITOURINARY: No dysuria, hematuria.  ENDOCRINE: No polyuria, nocturia,  HEMATOLOGY: No anemia, easy bruising or bleeding SKIN: No rash or lesion. MUSCULOSKELETAL: No joint pain or arthritis.   NEUROLOGIC: No tingling, numbness, weakness.  PSYCHIATRY: No anxiety or depression.   ROS  DRUG ALLERGIES:  No Known Allergies  VITALS:  Blood pressure 128/56, pulse 91, temperature 98.4 F (36.9 C), temperature source Oral, resp. rate 20, height  (1.676 m), weight 163.295 kg (360 lb), SpO2 100 %.  PHYSICAL EXAMINATION:  GENERAL:  50 y.o.-year-old obese patient lying in the bed with no acute distress.  EYES: Pupils equal, round, reactive to light and accommodation. No scleral icterus. Extraocular muscles intact.  HEENT: Head atraumatic, normocephalic. Oropharynx and nasopharynx clear.  NECK:  Supple, no jugular venous distention. No thyroid enlargement, no tenderness.  LUNGS: Normal breath sounds bilaterally, no wheezing, rales,rhonchi or crepitation. No use of accessory muscles of respiration.  CARDIOVASCULAR: S1, S2 normal. No murmurs, rubs, or gallops.  ABDOMEN: Soft, nontender, nondistended. Bowel sounds present. No organomegaly or mass.  EXTREMITIES:  No pedal edema, cyanosis, or clubbing.  NEUROLOGIC: Cranial nerves II through XII are intact. Muscle strength 5/5 in all extremities. Sensation intact. Gait not checked.  PSYCHIATRIC: The patient is alert and oriented x 3.  SKIN: No obvious rash, lesion, or ulcer.   Physical Exam LABORATORY PANEL:   CBC  Recent Labs Lab 08/31/15 0614  WBC 11.6*  HGB 9.4*  HCT 29.4*  PLT 393   ------------------------------------------------------------------------------------------------------------------  Chemistries   Recent Labs Lab 08/30/15 1809  09/01/15 0358  NA 135  < > 136  K 3.1*  < > 3.6  CL 98*  < > 104  CO2 24  < > 26  GLUCOSE 143*  < > 110*  BUN 19  < > 21*  CREATININE 2.72*  < > 2.03*  CALCIUM 8.7*  < > 8.3*  AST 81*  --   --   ALT 102*  --   --   ALKPHOS 63  --   --   BILITOT 0.7  --   --   < > = values in this interval not displayed. ------------------------------------------------------------------------------------------------------------------  Cardiac Enzymes No results for input(s): TROPONINI in the last 168 hours. ------------------------------------------------------------------------------------------------------------------  RADIOLOGY:  No results found.  ASSESSMENT AND PLAN:   Principal Problem:   Acute-on-chronic kidney injury (HCC) Active Problems:   Hyperlipidemia   Morbid obesity (HCC)   Arterial hypotension   Diarrhea   OSA on CPAP   Diabetes mellitus (HCC)   Dyslipidemia   Anxiety and depression   . Diarrhea -C. difficile toxins and gastrointestinal panel are negative - likely viral diarrhea. - give loperamide to help with diarrhea. - much improved with  that.  . Acute-on-chronic kidney injury (HCC) -IV fluid hydration, strict I's and O's, repeat BMP in a.m. -Hydrochlorothiazide and torsemide have been held -Neurontin decreased to renal dosing, now 100 mg by mouth 3 times a day - kidney func mild improved. - cont IV fluid one more  day. Baseline creatinin is around 1. And 1.3 just 1 week ago.  Hypokalemia -By mouth potassium ordered, repeat potassium noted in a.m.  Anxiety and depression - decreased patients Ativan dose decreased to 1 mg 3 times a day PRN from 2 mg by mouth 3 times a day when necessary.  Chronic pain likely cervical disc - cont home dose of oxycodone.  . hypertension  -Stable, home medications on hold for now.  . Hyperlipidemia -Stable, home medications resumed  . Morbid obesity (HCC) Obstructive sleep apnea and CPAP -CPAP ordered  Diabetes mellitus type 2 -ADA diet, sliding-scale insulin -Home medications on hold.    All the records are reviewed and case discussed with Care Management/Social Workerr. Management plans discussed with the patient, family and they are in agreement.  CODE STATUS: Full  TOTAL TIME TAKING CARE OF THIS PATIENT: 35 minutes.     POSSIBLE D/C IN 1-2 DAYS, DEPENDING ON CLINICAL CONDITION.   Altamese DillingVACHHANI, Valda Christenson M.D on 09/01/2015   Between 7am to 6pm - Pager - [phone]  After 6pm go to [email or web address] - password [email or web address]  [facility] [place] [facility]  Office  [phone]  CC: Primary care physician; Pcp Not In System  Note: This dictation was prepared with Dragon dictation along with smaller phrase technology. Any transcriptional errors that result from this process are unintentional.

## 2015-09-01 NOTE — Telephone Encounter (Signed)
Thank you :)

## 2015-09-02 LAB — BASIC METABOLIC PANEL
Anion gap: 8 (ref 5–15)
BUN: 16 mg/dL (ref 6–20)
CO2: 24 mmol/L (ref 22–32)
Calcium: 8.6 mg/dL — ABNORMAL LOW (ref 8.9–10.3)
Chloride: 103 mmol/L (ref 101–111)
Creatinine, Ser: 1.32 mg/dL — ABNORMAL HIGH (ref 0.44–1.00)
GFR calc Af Amer: 55 mL/min — ABNORMAL LOW (ref >60)
GFR, EST NON AFRICAN AMERICAN: 47 mL/min — AB (ref >60)
Glucose, Bld: 116 mg/dL — ABNORMAL HIGH (ref 65–99)
POTASSIUM: 3.7 mmol/L (ref 3.5–5.1)
SODIUM: 135 mmol/L (ref 135–145)

## 2015-09-02 LAB — GLUCOSE, CAPILLARY
GLUCOSE-CAPILLARY: 114 mg/dL — AB (ref 65–99)
Glucose-Capillary: 110 mg/dL — ABNORMAL HIGH (ref 65–99)

## 2015-09-02 MED ORDER — LOPERAMIDE HCL 2 MG PO CAPS: 2.0000 mg | ORAL_CAPSULE | Freq: Three times a day (TID) | ORAL | Status: AC | PRN

## 2015-09-02 NOTE — Care Management (Signed)
Patient for discharge home.  Have resumption of care orders for SN PT and OT.  notified [facility].  Family to transport home

## 2015-09-02 NOTE — Progress Notes (Signed)
Physical Therapy Treatment Patient Details Name: Tiffany Gomez MRN: [id number] DOB: 01/14/1966 Today's Date: 09/02/2015    History of Present Illness 50 y/o female here with acute-on-chronic kidney issues.  She was in the ED earlier this week with diarrhea, pt is obese.    PT Comments    Pt reluctant to participate with PT, but agreeable with gentle encouragement. Pt has moderate abdominal pain, which was addressed medicinally. Pt independent with bed mobility and transfers. Pt able to don socks and shoes independently from edge of bed. Ambulation distance improved with single point cane. According to pt, ambulation is at baseline in quality and speed. It is noted that pt ambulates with decreased speed for age and somewhat guarded with minimal trunk rotation/head turns. Pt reports chronic left sciatica issues limit her mobility/ambulation. Pt receives up in chair comfortably. Attempted some conversation/education regarding sciatica management as well as use of rolling walker when having increased pain/instability; pt very disinterested. No further PT attempted at this time. At the time of this document, pt has current discharge order to home. Would recommend outpatient PT follow up for addressing sciatica issues, gait and endurance and obtaining home exercise program.   Follow Up Recommendations  Home health PT;Outpatient PT (outpatient if pt has transportation)     Equipment Recommendations       Recommendations for Other Services       Precautions / Restrictions Precautions Precautions: Fall (Moderate) Restrictions Weight Bearing Restrictions: No    Mobility  Bed Mobility Overal bed mobility: Independent             General bed mobility comments: No issues. Dons socks/shoes from edge of bed  Transfers Overall transfer level: Independent Equipment used: Straight cane             General transfer comment: STS from bed/chair without difficulty or use of  AD  Ambulation/Gait Ambulation/Gait assistance: Supervision Ambulation Distance (Feet): 150 Feet Assistive device: Straight cane Gait Pattern/deviations: Step-through pattern;WFL(Within Functional Limits) Gait velocity: decreased (subjectively at pt's baseline) Gait velocity interpretation: Below normal speed for age/gender General Gait Details: Ambulates at her baseline speed and quality according to patient. No unsteadiness voiced. Pt does ambulate somewhat guarded with decreased arm swing and head turn.    Stairs            Wheelchair Mobility    Modified Rankin (Stroke Patients Only)       Balance Overall balance assessment: Modified Independent                                  Cognition Arousal/Alertness: Awake/alert Behavior During Therapy: WFL for tasks assessed/performed Overall Cognitive Status: Within Functional Limits for tasks assessed                      Exercises      General Comments        Pertinent Vitals/Pain Pain Assessment: 0-10 Pain Score: 4  Pain Location: Abdomen Pain Intervention(s): Premedicated before session    Home Living                      Prior Function            PT Goals (current goals can now be found in the care plan section) Progress towards PT goals: Progressing toward goals    Frequency  Min 2X/week    PT Plan Current plan remains  appropriate    Co-evaluation             End of Session   Activity Tolerance: Patient limited by pain (pt reports sciatica (chronic) pain limits ambulation ) Patient left: in chair;with call bell/phone within reach     Time: 1150-1210 PT Time Calculation (min) (ACUTE ONLY): 20 min  Charges:  $Gait Training: 8-22 mins                    G Codes:      Kristeen MissHeidi Elizabeth Bishop, PTA 09/02/2015, 1:31 PM

## 2015-09-02 NOTE — Discharge Summary (Signed)
[facility] - [place] at [facility]   PATIENT NAME: Tiffany Gomez    MR#:  [id number]  DATE OF BIRTH:  Aug 27, 1965  DATE OF ADMISSION:  08/30/2015 ADMITTING PHYSICIAN: Gery Pray, MD  DATE OF DISCHARGE: 09/02/15  PRIMARY CARE PHYSICIAN: Pcp Not In System    ADMISSION DIAGNOSIS:  Acute renal failure, unspecified acute renal failure type (HCC) [N17.9] Diarrhea, unspecified type [R19.7]  DISCHARGE DIAGNOSIS:  Acute on chronic renal failure now at baseline due to GI losses Diarrhea improved  SECONDARY DIAGNOSIS:   Past Medical History  Diagnosis Date  . Diabetes (HCC)   . Sleep apnea   . Leg pain   . Asthma   . Depression   . Morbid obesity (HCC)   . Essential hypertension   . Hyperlipidemia   . Anxiety     Panic attacks  . Renal insufficiency     HOSPITAL COURSE:   . Diarrhea -C. difficile toxins and gastrointestinal panel are negative - likely viral diarrhea. - give loperamide to help with diarrhea. - much improved with that.  . Acute-on-chronic kidney injury (HCC) -IV fluid hydration, strict I's and O's, repeat BMP today looks good. Creat a t baseline -Hydrochlorothiazide and torsemide have been held however will resume in 1-2 days -Neurontin decreased to renal dosing, now 100 mg by mouth 3 times a day - kidney func mild improved. - Hypokalemia -By mouth potassium ordered -repeleted  Anxiety and depression Resume home meds  Chronic pain likely cervical disc - cont home dose of oxycodone.  . hypertension  -Stable, home medications on hold for now.  . Hyperlipidemia -Stable, home medications resumed  . Morbid obesity (HCC) Obstructive sleep apnea and CPAP -CPAP ordered  Diabetes mellitus type 2 -ADA diet, sliding-scale insulin Resume home dose of insulin as well. Patient eating reasonably well   Overall improved. Patient requesting to go home. She will be discharged home with home health PT.   CONSULTS OBTAINED:      DRUG ALLERGIES:  No Known Allergies  DISCHARGE MEDICATIONS:   Current Discharge Medication List    START taking these medications   Details  loperamide (IMODIUM) 2 MG capsule Take 1 capsule (2 mg total) by mouth every 8 (eight) hours as needed for diarrhea or loose stools. Qty: 30 capsule, Refills: 0      CONTINUE these medications which have NOT CHANGED   Details  albuterol (PROVENTIL HFA;VENTOLIN HFA) 108 (90 BASE) MCG/ACT inhaler Inhale 2 puffs into the lungs every 6 (six) hours as needed for wheezing or shortness of breath.    alprazolam (XANAX) 2 MG tablet Take 2 mg by mouth See admin instructions. Take 1 tablet by mouth 3 to 4 times a day as needed anxiety. Refills: 3    clonazePAM (KLONOPIN) 1 MG tablet Take 1 tablet (1 mg total) by mouth at bedtime. Qty: 30 tablet, Refills: 4    gabapentin (NEURONTIN) 300 MG capsule Take 300-600 mg by mouth 3 (three) times daily as needed (for pain).    glipiZIDE (GLUCOTROL XL) 5 MG 24 hr tablet Take 5 mg by mouth daily. Refills: 1    hydrochlorothiazide (MICROZIDE) 12.5 MG capsule Take 12.5 mg by mouth daily. Refills: 1    insulin aspart (NOVOLOG) 100 UNIT/ML injection Inject 0-15 Units into the skin 3 (three) times daily with meals. Qty: 10 mL, Refills: 11    linagliptin (TRADJENTA) 5 MG TABS tablet Take 5 mg by mouth daily.    megestrol (MEGACE) 40 MG tablet Take  40 mg by mouth daily.  Refills: 3    montelukast (SINGULAIR) 10 MG tablet Take 10 mg by mouth every morning.     Oxycodone HCl 10 MG TABS Take 1 tablet (10 mg total) by mouth 5 (five) times daily as needed (for pain). Qty: 30 tablet, Refills: 0    pravastatin (PRAVACHOL) 20 MG tablet Take 20 mg by mouth at bedtime.    torsemide (DEMADEX) 10 MG tablet Take 10 mg by mouth daily. Refills: 3    venlafaxine (EFFEXOR) 75 MG tablet Take 75 mg by mouth 2 (two) times daily with a meal.  Refills: 2    zaleplon (SONATA) 10 MG capsule Take 10 mg by mouth at bedtime  as needed. For sleep.    ziprasidone (GEODON) 80 MG capsule Take 160 mg by mouth at bedtime.  Refills: 2    metoprolol tartrate (LOPRESSOR) 25 MG tablet Take 1 tablet (25 mg total) by mouth 2 (two) times daily. Qty: 60 tablet, Refills: 0    !! traMADol (ULTRAM) 50 MG tablet Take 1 tablet (50 mg total) by mouth every 6 (six) hours as needed. Qty: 20 tablet, Refills: 0    !! traMADol (ULTRAM) 50 MG tablet Take 1 tablet (50 mg total) by mouth every 6 (six) hours as needed. Qty: 12 tablet, Refills: 0     !! - Potential duplicate medications found. Please discuss with provider.      If you experience worsening of your admission symptoms, develop shortness of breath, life threatening emergency, suicidal or homicidal thoughts you must seek medical attention immediately by calling 911 or calling your MD immediately  if symptoms less severe.  You Must read complete instructions/literature along with all the possible adverse reactions/side effects for all the Medicines you take and that have been prescribed to you. Take any new Medicines after you have completely understood and accept all the possible adverse reactions/side effects.   Please note  You were cared for by a hospitalist during your hospital stay. If you have any questions about your discharge medications or the care you received while you were in the hospital after you are discharged, you can call the unit and asked to speak with the hospitalist on call if the hospitalist that took care of you is not available. Once you are discharged, your primary care physician will handle any further medical issues. Please note that NO REFILLS for any discharge medications will be authorized once you are discharged, as it is imperative that you return to your primary care physician (or establish a relationship with a primary care physician if you do not have one) for your aftercare needs so that they can reassess your need for medications and monitor  your lab values. Today   SUBJECTIVE   Doing well  VITAL SIGNS:  Blood pressure 108/70, pulse 100, temperature 98.2 F (36.8 C), temperature source Oral, resp. rate 23, height 5\' 6"  (1.676 m), weight 163.295 kg (360 lb), SpO2 98 %.  I/O:   Intake/Output Summary (Last 24 hours) at 09/02/15 1249 Last data filed at 09/02/15 [id number]  Gross per 24 hour  Intake   2289 ml  Output      0 ml  Net   2289 ml    PHYSICAL EXAMINATION:  GENERAL:  50 y.o.-year-old patient lying in the bed with no acute distress. Morbidly obese EYES: Pupils equal, round, reactive to light and accommodation. No scleral icterus. Extraocular muscles intact.  HEENT: Head atraumatic, normocephalic. Oropharynx and nasopharynx clear.  NECK:  Supple, no jugular venous distention. No thyroid enlargement, no tenderness.  LUNGS: Normal breath sounds bilaterally, no wheezing, rales,rhonchi or crepitation. No use of accessory muscles of respiration.  CARDIOVASCULAR: S1, S2 normal. No murmurs, rubs, or gallops.  ABDOMEN: Soft, non-tender, non-distended. Bowel sounds present. No organomegaly or mass.  EXTREMITIES: No pedal edema, cyanosis, or clubbing.  NEUROLOGIC: Cranial nerves II through XII are intact. Muscle strength 5/5 in all extremities. Sensation intact. Gait not checked.  PSYCHIATRIC: The patient is alert and oriented x 3.  SKIN: No obvious rash, lesion, or ulcer.   DATA REVIEW:   CBC   Recent Labs Lab 08/31/15 0614  WBC 11.6*  HGB 9.4*  HCT 29.4*  PLT 393    Chemistries   Recent Labs Lab 08/30/15 1809  09/02/15 0329  NA 135  < > 135  K 3.1*  < > 3.7  CL 98*  < > 103  CO2 24  < > 24  GLUCOSE 143*  < > 116*  BUN 19  < > 16  CREATININE 2.72*  < > 1.32*  CALCIUM 8.7*  < > 8.6*  AST 81*  --   --   ALT 102*  --   --   ALKPHOS 63  --   --   BILITOT 0.7  --   --   < > = values in this interval not displayed.  Microbiology Results   Recent Results (from the past 240 hour(s))  C difficile quick  scan w PCR reflex     Status: None   Collection Time: 08/30/15 11:30 PM  Result Value Ref Range Status   C Diff antigen NEGATIVE NEGATIVE Final   C Diff toxin NEGATIVE NEGATIVE Final   C Diff interpretation Negative for C. difficile  Final  Gastrointestinal Panel by PCR , Stool     Status: None   Collection Time: 08/30/15 11:30 PM  Result Value Ref Range Status   Campylobacter species NOT DETECTED NOT DETECTED Final   Plesimonas shigelloides NOT DETECTED NOT DETECTED Final   Salmonella species NOT DETECTED NOT DETECTED Final   Yersinia enterocolitica NOT DETECTED NOT DETECTED Final   Vibrio species NOT DETECTED NOT DETECTED Final   Vibrio cholerae NOT DETECTED NOT DETECTED Final   Enteroaggregative E coli (EAEC) NOT DETECTED NOT DETECTED Final   Enteropathogenic E coli (EPEC) NOT DETECTED NOT DETECTED Final   Enterotoxigenic E coli (ETEC) NOT DETECTED NOT DETECTED Final   Shiga like toxin producing E coli (STEC) NOT DETECTED NOT DETECTED Final   E. coli O157 NOT DETECTED NOT DETECTED Final   Shigella/Enteroinvasive E coli (EIEC) NOT DETECTED NOT DETECTED Final   Cryptosporidium NOT DETECTED NOT DETECTED Final   Cyclospora cayetanensis NOT DETECTED NOT DETECTED Final   Entamoeba histolytica NOT DETECTED NOT DETECTED Final   Giardia lamblia NOT DETECTED NOT DETECTED Final   Adenovirus F40/41 NOT DETECTED NOT DETECTED Final   Astrovirus NOT DETECTED NOT DETECTED Final   Norovirus GI/GII NOT DETECTED NOT DETECTED Final   Rotavirus A NOT DETECTED NOT DETECTED Final   Sapovirus (I, II, IV, and V) NOT DETECTED NOT DETECTED Final    RADIOLOGY:  No results found.   Management plans discussed with the patient, family and they are in agreement.  CODE STATUS:     Code Status Orders        Start     Ordered   08/30/15 2125  Full code   Continuous     08/30/15 2124  Code Status History    Date Active Date Inactive Code Status Order ID Comments User Context   07/29/2015  7:54 PM  08/05/2015  7:51 PM Full Code [id number]  Stephanie Acre, MD ED   06/16/2014  6:15 PM 06/17/2014  4:57 PM Full Code [id number]  Julio Sicks, MD Inpatient      TOTAL TIME TAKING CARE OF THIS PATIENT: 40 minutes.    Adalyne Lovick M.D on 09/02/2015 at 12:49 PM  Between 7am to 6pm - Pager - [phone] After 6pm go to [email or web address] - password [email or web address]  [facility]  Office  [phone]  CC: Primary care physician; Pcp Not In System

## 2015-09-02 NOTE — Progress Notes (Signed)
Pt stable. IV removed. D/c instructions given and education provided. Electronic prescriptions verified. Pt states she understands instructions. Pt will be dressed and escorted out by staff and driven home by family.

## 2015-09-03 ENCOUNTER — Ambulatory Visit: Payer: Medicare Other | Attending: Pain Medicine | Admitting: Pain Medicine

## 2015-09-03 ENCOUNTER — Other Ambulatory Visit: Payer: Self-pay | Admitting: Pain Medicine

## 2015-09-03 ENCOUNTER — Encounter: Payer: Self-pay | Admitting: Pain Medicine

## 2015-09-03 VITALS — BP 136/90 | HR 117 | Temp 96.6°F | Resp 17 | Ht 66.0 in | Wt 360.0 lb

## 2015-09-03 DIAGNOSIS — M5116 Intervertebral disc disorders with radiculopathy, lumbar region: Secondary | ICD-10-CM | POA: Diagnosis not present

## 2015-09-03 DIAGNOSIS — M545 Low back pain: Secondary | ICD-10-CM | POA: Diagnosis present

## 2015-09-03 DIAGNOSIS — M5416 Radiculopathy, lumbar region: Secondary | ICD-10-CM

## 2015-09-03 DIAGNOSIS — Z9889 Other specified postprocedural states: Secondary | ICD-10-CM | POA: Diagnosis not present

## 2015-09-03 DIAGNOSIS — M5030 Other cervical disc degeneration, unspecified cervical region: Secondary | ICD-10-CM | POA: Insufficient documentation

## 2015-09-03 DIAGNOSIS — M4806 Spinal stenosis, lumbar region: Secondary | ICD-10-CM | POA: Insufficient documentation

## 2015-09-03 DIAGNOSIS — M47816 Spondylosis without myelopathy or radiculopathy, lumbar region: Secondary | ICD-10-CM

## 2015-09-03 DIAGNOSIS — M5126 Other intervertebral disc displacement, lumbar region: Secondary | ICD-10-CM | POA: Diagnosis not present

## 2015-09-03 DIAGNOSIS — M542 Cervicalgia: Secondary | ICD-10-CM | POA: Diagnosis present

## 2015-09-03 DIAGNOSIS — M5000 Cervical disc disorder with myelopathy, unspecified cervical region: Secondary | ICD-10-CM

## 2015-09-03 DIAGNOSIS — M5020 Other cervical disc displacement, unspecified cervical region: Secondary | ICD-10-CM

## 2015-09-03 MED ORDER — OXYCODONE HCL 10 MG PO TABS: ORAL_TABLET | ORAL | Status: DC

## 2015-09-03 MED ORDER — GABAPENTIN 100 MG PO CAPS: ORAL_CAPSULE | ORAL | Status: DC

## 2015-09-03 NOTE — Progress Notes (Signed)
Subjective:    Patient ID: Tiffany Gomez, female    DOB: 05/06/1965, 50 y.o.   MRN: [id number]  HPI  The patient is a 50 year old female who returns to pain management for further evaluation and treatment of pain involving the region of the neck upper extremity region as well as the lower back and lower extremity regions. The patient is status post surgical intervention of the cervical region will follow-up with Dr. Dutch Quint for further neurosurgical evaluation. The patient was recently hospitalized with diagnosis of acute renal failure and was unconscious at the time when she was found at her home. We informed patient that we would need to have patient follow up with her primary care physician to discuss her condition and to address her general medical condition before we could prescribe additional medications for treatment of patient's pain. The patient was understanding and will proceed with follow-up evaluation with her primary care physician and we will consider modification of medications and treatment of patient pending review of additional information. All agreed to suggested treatment plan. The patient continues to have pain involving the neck upper extremity region of lesser degree and predominant pain involving the lower back and lower extremity regions. The patient's pain is aggravated by standing walking and at the present time patient is without plans for additional surgical intervention. We will also advise patient to follow up Dr. Dutch Quint for further neurosurgical evaluation and we will also consider neurological evaluation of patient as we discussed as well.  Review of Systems     Objective:   Physical Exam  There was tenderness to palpation of the paraspinal musculature region cervical region cervical facet region with tenderness of the splenius capitis and occipitalis regions of moderate degree. The patient was with tenderness of the acromioclavicular and glenohumeral joint  regions of mild degree and appeared to be with slightly decreased grip strength. Palpation over the thoracic region was without crepitus of the thoracic region noted. Palpation over the lumbar paraspinal musculature region lumbar facet region was attends to palpation of moderate degree. There was moderate tenderness of the PSIS and PII S region as well as. There was mild tenderness of the greater trochanteric region iliotibial band region. Straight leg raise was tolerates approximately 20 without a definite increased pain with dorsiflexion noted. No definite sensory deficit of dermatomal distribution detected. There was negative clonus negative Homans. 10th appeared to be slightly decreased. Abdomen was protuberant without excessive tends to palpation and no costovertebral tenderness was noted      Assessment & Plan:     Degenerative disc disease lumbar spine Mild to moderate multilevel degenerative changes with facet arthrosis worse at L5-S1 without significant spinal canal of neural foraminal stenosis mild asymmetric enlargement of the right L5 nerve root which could reflect a radiculitis, L5-S1 mild bulging disc with symptoms small central disc protrusion. L4-5 facet arthrosis. L3-4 disc bulging with facet arthropathy. L2-3 left foraminal/extra foraminal disc protrusion slightly displaces the exiting L2 nerve root. L1-2 shallow left foraminal/extra foraminal disc protrusion  Lumbar stenosis with neurogenic claudication  Lumbar radiculopathy  Lumbar facet syndrome  Degenerative disc disease cervical spine  Status post surgery of the cervical region     PLAN    Continue present medications . We will need letter or statement from your primary care physician stating that it is safe to resume prescribing medications for treatment of your pain due to your recent hospitalization  F/U PCP at [facility] clinic today for this week for evaluation elevated  blood pressure and for assessment of  condition status post hospitalization   F/U surgical evaluation. Neurosurgical reevaluation with Dr. Dutch QuintPoole as discussed  F/U neurological evaluation as discussed  May consider radiofrequency rhizolysis or intraspinal procedures pending response to present treatment and F/U evaluation.  Patient to call Pain Management Center should patient have concerns prior to scheduled return appointment

## 2015-09-03 NOTE — Progress Notes (Signed)
Safety precautions to be maintained throughout the outpatient stay will include: orient to surroundings, keep bed in low position, maintain call bell within reach at all times, provide assistance with transfer out of bed and ambulation.  

## 2015-09-03 NOTE — Patient Instructions (Addendum)
  Continue present medications . We will need that her from your primary care physician stating that it is safe to resume prescribing medications for treatment of your pain due to your recent hospitalization  [facility] for this week for evaluation elevated blood pressure and for assessment of condition status post hospitalization  The patient return to clinic today and provided us with lateral from primary care physician stating that we could resume oxycodone and reduce Neurontin to 100 mg 3 times per day. Dena receive letter from patient which patient stated was from her primary care physician of the rising that we resume oxycodone and Neurontin since patient has been evaluated and was felt to be able to tolerate the medication.. The patient was admitted for acute renal failure and patient's renal function normalized prior to discharge from hospital according to the letter from the primary care physician which was received today The letter is to be scanned The patient was provided prescription for oxycodone 10 mg limit 1-3 pills per day for quantity of 90 The patient was given prescription for Neurontin 100 mg twice a day to 3 times a day for a quantity of 80 . The Neurontin was E prescribed   F/U surgical evaluation. Neurosurgical reevaluation with Dr. Dutch QuintPoole as discussed  F/U neurological evaluation as discussed  May consider radiofrequency rhizolysis or intraspinal procedures pending response to present treatment and F/U evaluation.  Patient to call Pain Management Center should patient have concerns prior to scheduled return appointment

## 2015-09-08 ENCOUNTER — Ambulatory Visit: Payer: Medicare Other | Admitting: Pain Medicine

## 2015-09-10 LAB — TOXASSURE SELECT 13 (MW), URINE: PDF: 0

## 2015-09-10 NOTE — Progress Notes (Signed)
Quick Note:  Reviewed. ______ 

## 2015-09-26 ENCOUNTER — Other Ambulatory Visit: Payer: Self-pay | Admitting: Pain Medicine

## 2015-10-01 ENCOUNTER — Ambulatory Visit: Payer: Medicare Other | Attending: Pain Medicine | Admitting: Pain Medicine

## 2015-10-01 ENCOUNTER — Encounter: Payer: Self-pay | Admitting: Pain Medicine

## 2015-10-01 ENCOUNTER — Other Ambulatory Visit: Payer: Self-pay | Admitting: Pain Medicine

## 2015-10-01 VITALS — BP 144/96 | HR 112 | Temp 98.2°F | Resp 16 | Ht 66.0 in | Wt 365.0 lb

## 2015-10-01 DIAGNOSIS — M62830 Muscle spasm of back: Secondary | ICD-10-CM | POA: Insufficient documentation

## 2015-10-01 DIAGNOSIS — M47896 Other spondylosis, lumbar region: Secondary | ICD-10-CM | POA: Insufficient documentation

## 2015-10-01 DIAGNOSIS — M5000 Cervical disc disorder with myelopathy, unspecified cervical region: Secondary | ICD-10-CM

## 2015-10-01 DIAGNOSIS — M5030 Other cervical disc degeneration, unspecified cervical region: Secondary | ICD-10-CM | POA: Diagnosis not present

## 2015-10-01 DIAGNOSIS — M5416 Radiculopathy, lumbar region: Secondary | ICD-10-CM

## 2015-10-01 DIAGNOSIS — M5126 Other intervertebral disc displacement, lumbar region: Secondary | ICD-10-CM | POA: Insufficient documentation

## 2015-10-01 DIAGNOSIS — Z9889 Other specified postprocedural states: Secondary | ICD-10-CM | POA: Diagnosis not present

## 2015-10-01 DIAGNOSIS — M47816 Spondylosis without myelopathy or radiculopathy, lumbar region: Secondary | ICD-10-CM

## 2015-10-01 DIAGNOSIS — M5116 Intervertebral disc disorders with radiculopathy, lumbar region: Secondary | ICD-10-CM | POA: Insufficient documentation

## 2015-10-01 DIAGNOSIS — M546 Pain in thoracic spine: Secondary | ICD-10-CM | POA: Diagnosis present

## 2015-10-01 DIAGNOSIS — M4806 Spinal stenosis, lumbar region: Secondary | ICD-10-CM | POA: Insufficient documentation

## 2015-10-01 DIAGNOSIS — M542 Cervicalgia: Secondary | ICD-10-CM | POA: Diagnosis present

## 2015-10-01 DIAGNOSIS — M5020 Other cervical disc displacement, unspecified cervical region: Secondary | ICD-10-CM

## 2015-10-01 MED ORDER — GABAPENTIN 100 MG PO CAPS: ORAL_CAPSULE | ORAL | Status: DC

## 2015-10-01 MED ORDER — OXYCODONE HCL 10 MG PO TABS: ORAL_TABLET | ORAL | Status: DC

## 2015-10-01 NOTE — Patient Instructions (Signed)
  PLAN   Continue present medications Neurontin and oxycodone .  [facility] for this week for evaluation of blood pressure and general medical condition  F/U surgical evaluation. Neurosurgical reevaluation with Dr. Dutch QuintPoole as discussed  F/U neurological evaluation as discussed  May consider radiofrequency rhizolysis or intraspinal procedures pending response to present treatment and F/U evaluation.  Patient to call Pain Management Center should patient have concerns prior to scheduled return appointment

## 2015-10-01 NOTE — Progress Notes (Signed)
   Subjective:    Patient ID: Tiffany Gomez, female    DOB: 06-18-1965, 50 y.o.   MRN: [id number]  HPI  The patient is a 50 year old female who returns to pain management for further evaluation and treatment of pain involving the neck and upper back upper and lower extremity regions. The patient is undergone surgical intervention cervical region by Dr. Lelon PerlaHenry Poole. At the present time patient is with pain of the lower back lower extremity region. The patient recently was hospitalized and medications were decreased. We discussed patient's condition on today's visit and informed patient that we wish to continue medications at the present dose. We informed patient that we wish to avoid interventional treatment at this time as well the patient is to follow-up her primary care physician and we will remain available to consider modification of treatment pending evaluation of primary care physician regarding patient's general medical condition. The patient was with understanding and agreement suggested treatment plan.  Review of Systems     Objective:   Physical Exam  There was tenderness of the paraspinal muscular treat and cervical region cervical facet region of moderate degree with well-healed surgical scar of the cervical region without increased warmth and erythema in the region scar. The patient appeared to be with slightly decreased grip strength and Tinel and Phalen's maneuver were without increased pain of significant degree. Palpation of the thoracic region was with tenderness to palpation with evidence of muscle spasm moderate degree with no crepitus of the thoracic region noted. Patient appeared to be with unremarkable Spurling's maneuver. Palpation over the lumbar region lumbar facet region was with moderate tenderness to palpation with lateral bending rotation extension and palpation of the lumbar facets reproducing moderate discomfort. S there was tenderness over the PSIS and PII S region  a moderate degree. There was mild to moderate tenderness of the greater trochanteric region iliotibial band region traight leg raise was tolerates approximately 20 without increased pain with dorsiflexion noted. DTRs were difficult to elicit patient had difficulty relaxing. No sensory deficit or dermatomal distribution detected. EHL strength appeared to be decreased. There was negative clonus negative Homans. Abdomen nontender with no costovertebral tenderness noted.      Assessment & Plan:     Degenerative disc disease lumbar spine Mild to moderate multilevel degenerative changes with facet arthrosis worse at L5-S1 without significant spinal canal of neural foraminal stenosis mild asymmetric enlargement of the right L5 nerve root which could reflect a radiculitis, L5-S1 mild bulging disc with symptoms small central disc protrusion. L4-5 facet arthrosis. L3-4 disc bulging with facet arthropathy. L2-3 left foraminal/extra foraminal disc protrusion slightly displaces the exiting L2 nerve root. L1-2 shallow left foraminal/extra foraminal disc protrusion  Lumbar stenosis with neurogenic claudication  Lumbar radiculopathy  Lumbar facet syndrome  Degenerative disc disease cervical spine  Status post surgery of the cervical region     PLAN   Continue present medications Neurontin and oxycodone .  [facility] for this week for evaluation of blood pressure and general medical condition  F/U surgical evaluation. Neurosurgical reevaluation with Dr. Dutch QuintPoole as discussed  F/U neurological evaluation as discussed  May consider radiofrequency rhizolysis or intraspinal procedures pending response to present treatment and F/U evaluation.  Patient to call Pain Management Center should patient have concerns prior to scheduled return appointment

## 2015-10-01 NOTE — Progress Notes (Signed)
Safety precautions to be maintained throughout the outpatient stay will include: orient to surroundings, keep bed in low position, maintain call bell within reach at all times, provide assistance with transfer out of bed and ambulation.  

## 2015-10-26 ENCOUNTER — Other Ambulatory Visit: Payer: Self-pay | Admitting: Pain Medicine

## 2015-10-26 ENCOUNTER — Ambulatory Visit: Payer: Medicare Other | Attending: Pain Medicine | Admitting: Pain Medicine

## 2015-10-26 ENCOUNTER — Encounter: Payer: Self-pay | Admitting: Pain Medicine

## 2015-10-26 VITALS — BP 152/98 | HR 106 | Temp 98.6°F | Resp 16 | Ht 66.0 in | Wt 365.0 lb

## 2015-10-26 DIAGNOSIS — M545 Low back pain: Secondary | ICD-10-CM | POA: Diagnosis not present

## 2015-10-26 DIAGNOSIS — M5030 Other cervical disc degeneration, unspecified cervical region: Secondary | ICD-10-CM | POA: Insufficient documentation

## 2015-10-26 DIAGNOSIS — M5416 Radiculopathy, lumbar region: Secondary | ICD-10-CM

## 2015-10-26 DIAGNOSIS — M4806 Spinal stenosis, lumbar region: Secondary | ICD-10-CM | POA: Diagnosis not present

## 2015-10-26 DIAGNOSIS — M5126 Other intervertebral disc displacement, lumbar region: Secondary | ICD-10-CM | POA: Diagnosis not present

## 2015-10-26 DIAGNOSIS — Z9889 Other specified postprocedural states: Secondary | ICD-10-CM | POA: Insufficient documentation

## 2015-10-26 DIAGNOSIS — M47816 Spondylosis without myelopathy or radiculopathy, lumbar region: Secondary | ICD-10-CM

## 2015-10-26 DIAGNOSIS — M542 Cervicalgia: Secondary | ICD-10-CM | POA: Diagnosis present

## 2015-10-26 DIAGNOSIS — M5020 Other cervical disc displacement, unspecified cervical region: Secondary | ICD-10-CM

## 2015-10-26 DIAGNOSIS — M5116 Intervertebral disc disorders with radiculopathy, lumbar region: Secondary | ICD-10-CM | POA: Insufficient documentation

## 2015-10-26 DIAGNOSIS — M5000 Cervical disc disorder with myelopathy, unspecified cervical region: Secondary | ICD-10-CM

## 2015-10-26 MED ORDER — OXYCODONE HCL 10 MG PO TABS: ORAL_TABLET | ORAL | 0 refills | Status: DC

## 2015-10-26 NOTE — Patient Instructions (Addendum)
PLAN   Continue present medications Neurontin and oxycodone .  F/U PCP at [facility] clinic today for this week for evaluation of blood pressure and general medical condition as discussed today  F/U surgical evaluation. Neurosurgical reevaluation with Dr. Dutch Quint as discussed  F/U neurological evaluation as discussed  May consider radiofrequency rhizolysis or intraspinal procedures pending response to present treatment and F/U evaluation.  Patient to call Pain Management Center should patient have concerns prior to scheduled return appointment

## 2015-10-26 NOTE — Progress Notes (Signed)
   The patient is a 50 year old female who returns to pain management for further evaluation and treatment of pain involving the neck upper extremity regions as well as the lower back and lower extremity regions. The patient is status post surgical intervention of the cervical region and has undergone evaluation for lumbar lower extremity pain paresthesias and weakness without plans for immediate surgery of the lumbar region at this time. The patient is with medications consisting of oxycodone and Neurontin and appears to be tolerating medications well without undesirable side effects. We have discussed patient's condition and we will continue presently prescribed medications at this time and we will remain available to consider patient for modification of treatment regimen pending response to treatment and to follow up evaluation. The patient will also follow up Dr. Dutch Quint regarding neurosurgical reevaluation as discussed. Patient will continue oxycodone and Neurontin at this time as prescribed and will call pain management should they be change in condition prior to scheduled return appointment. All agreed to suggested treatment plan.   Physical examination  There was tenderness to palpation of the paraspinal musculature region cervical region cervical facet region palpation which be produced pain of moderate degree with limited range of motion of the cervical spine noted. Palpation over the splenius capitis and occipitalis regions reproduce moderate discomfort. The patient was with tenderness of the acromioclavicular and glenohumeral joint regions of moderate degree as well. The patient appeared to be with slightly decreased grip strength with Tinel and Phalen's maneuver reproducing minimal discomfort. Palpation of the thoracic region was attends to palpation of moderate degree with no crepitus of the thoracic region noted. Palpation of the lumbar region was with tenderness to palpation of moderate degree  with lateral bending rotation extension and palpation over the lumbar facets reproducing moderate discomfort. Straight leg raising was limited to approximately 20 without a definite increase of pain with dorsiflexion noted. EHL strength appeared to be slightly decreased. There was no definite sensory deficit or dermatomal distribution detected. There was negative clonus negative Homans. There was tenderness of the greater trochanteric region iliotibial band region of mild to moderate degree. Palpation over the PSIS and PII S regions reproduce moderate discomfort. There was negative clonus negative Homans. Abdomen was protuberant without excessive tenderness to palpation and no costovertebral tenderness was noted.    Assessment     Degenerative disc disease lumbar spine Mild to moderate multilevel degenerative changes with facet arthrosis worse at L5-S1 without significant spinal canal of neural foraminal stenosis mild asymmetric enlargement of the right L5 nerve root which could reflect a radiculitis, L5-S1 mild bulging disc with symptoms small central disc protrusion. L4-5 facet arthrosis. L3-4 disc bulging with facet arthropathy. L2-3 left foraminal/extra foraminal disc protrusion slightly displaces the exiting L2 nerve root. L1-2 shallow left foraminal/extra foraminal disc protrusion  Lumbar stenosis with neurogenic claudication  Lumbar radiculopathy  Lumbar facet syndrome  Degenerative disc disease cervical spine  Status post surgery of the cervical region     PLAN   Continue present medications Neurontin and oxycodone .  F/U PCP at [facility] clinic for evaluation of blood pressure and general medical condition  F/U surgical evaluation. Neurosurgical reevaluation with Dr. Dutch Quint as discussed  F/U neurological evaluation as discussed  May consider radiofrequency rhizolysis or intraspinal procedures pending response to present treatment and F/U evaluation.  Patient to call  Pain Management Center should patient have concerns prior to scheduled return appointment

## 2015-10-29 ENCOUNTER — Other Ambulatory Visit: Payer: Self-pay | Admitting: Pain Medicine

## 2015-10-29 ENCOUNTER — Telehealth: Payer: Self-pay | Admitting: *Deleted

## 2015-10-29 MED ORDER — GABAPENTIN 100 MG PO CAPS: ORAL_CAPSULE | ORAL | 0 refills | Status: DC

## 2015-10-29 NOTE — Telephone Encounter (Signed)
Spoke to patient to let her know that Gabapentin has been escribed to [facility] in [place].

## 2015-10-29 NOTE — Telephone Encounter (Signed)
Needs a refill on Gabapentin. [facility] pharmacy [address].

## 2015-10-29 NOTE — Telephone Encounter (Signed)
Nurses Please inform patient that gabapentin was E prescribed  Thank you

## 2015-10-29 NOTE — Telephone Encounter (Signed)
Phoned patient to let her know that Gabapentin has been escribed to pharmacy.

## 2015-11-21 ENCOUNTER — Other Ambulatory Visit: Payer: Self-pay | Admitting: Pain Medicine

## 2015-12-01 ENCOUNTER — Ambulatory Visit: Payer: Medicare Other | Attending: Pain Medicine | Admitting: Pain Medicine

## 2015-12-01 ENCOUNTER — Encounter: Payer: Self-pay | Admitting: Pain Medicine

## 2015-12-01 ENCOUNTER — Other Ambulatory Visit: Payer: Self-pay | Admitting: Pain Medicine

## 2015-12-01 VITALS — BP 142/100 | HR 114 | Temp 97.8°F | Resp 18 | Ht 67.0 in | Wt 360.0 lb

## 2015-12-01 DIAGNOSIS — M5010 Cervical disc disorder with radiculopathy, unspecified cervical region: Secondary | ICD-10-CM

## 2015-12-01 DIAGNOSIS — M47816 Spondylosis without myelopathy or radiculopathy, lumbar region: Secondary | ICD-10-CM | POA: Insufficient documentation

## 2015-12-01 DIAGNOSIS — Z9889 Other specified postprocedural states: Secondary | ICD-10-CM | POA: Insufficient documentation

## 2015-12-01 DIAGNOSIS — M62830 Muscle spasm of back: Secondary | ICD-10-CM | POA: Diagnosis not present

## 2015-12-01 DIAGNOSIS — M4806 Spinal stenosis, lumbar region: Secondary | ICD-10-CM | POA: Diagnosis not present

## 2015-12-01 DIAGNOSIS — M5030 Other cervical disc degeneration, unspecified cervical region: Secondary | ICD-10-CM | POA: Insufficient documentation

## 2015-12-01 DIAGNOSIS — M5116 Intervertebral disc disorders with radiculopathy, lumbar region: Secondary | ICD-10-CM | POA: Diagnosis not present

## 2015-12-01 DIAGNOSIS — M5416 Radiculopathy, lumbar region: Secondary | ICD-10-CM

## 2015-12-01 DIAGNOSIS — M545 Low back pain: Secondary | ICD-10-CM | POA: Diagnosis present

## 2015-12-01 DIAGNOSIS — M542 Cervicalgia: Secondary | ICD-10-CM | POA: Diagnosis present

## 2015-12-01 DIAGNOSIS — M5020 Other cervical disc displacement, unspecified cervical region: Secondary | ICD-10-CM

## 2015-12-01 DIAGNOSIS — M5000 Cervical disc disorder with myelopathy, unspecified cervical region: Secondary | ICD-10-CM

## 2015-12-01 MED ORDER — OXYCODONE HCL 10 MG PO TABS: ORAL_TABLET | ORAL | 0 refills | Status: AC

## 2015-12-01 MED ORDER — GABAPENTIN 100 MG PO CAPS: ORAL_CAPSULE | ORAL | 0 refills | Status: AC

## 2015-12-01 NOTE — Progress Notes (Signed)
Safety precautions to be maintained throughout the outpatient stay will include: orient to surroundings, keep bed in low position, maintain call bell within reach at all times, provide assistance with transfer out of bed and ambulation.  

## 2015-12-01 NOTE — Patient Instructions (Addendum)
PLAN   Continue present medications Neurontin and oxycodone .  F/U PCP at [facility] clinic today for evaluation of blood pressure and general medical condition as discussed today  F/U surgical evaluation. Neurosurgical reevaluation with Dr. Dutch QuintPoole as discussed  F/U neurological evaluation as discussed  May consider radiofrequency rhizolysis or intraspinal procedures pending response to present treatment and F/U evaluation.  Patient to call Pain Management Center should patient have concerns prior to scheduled return appointmentPain Management Discharge Instructions  General Discharge Instructions :  If you need to reach your doctor call: Monday-Friday 8:00 am - 4:00 pm at [phone] or toll free [phone].  After clinic hours [phone] to have operator reach doctor.  Bring all of your medication bottles to all your appointments in the pain clinic.  To cancel or reschedule your appointment with Pain Management please remember to call 24 hours in advance to avoid a fee.  Refer to the educational materials which you have been given on: General Risks, I had my Procedure. Discharge Instructions, Post Sedation.  Post Procedure Instructions:  The drugs you were given will stay in your system until tomorrow, so for the next 24 hours you should not drive, make any legal decisions or drink any alcoholic beverages.  You may eat anything you prefer, but it is better to start with liquids then soups and crackers, and gradually work up to solid foods.  Please notify your doctor immediately if you have any unusual bleeding, trouble breathing or pain that is not related to your normal pain.  Depending on the type of procedure that was done, some parts of your body may feel week and/or numb.  This usually clears up by tonight or the next day.  Walk with the use of an assistive device or accompanied by an adult for the 24 hours.  You may use ice on the affected area for the first 24 hours.   Put ice in a Ziploc bag and cover with a towel and place against area 15 minutes on 15 minutes off.  You may switch to heat after 24 hours.

## 2015-12-01 NOTE — Progress Notes (Signed)
     The patient is a 50 year old female who returns to pain management for further evaluation and treatment of pain involving the neck upper extremity regions as well as the lower back and lower extremity regions. The patient is status post surgical intervention of the cervical region by Dr. Dutch QuintPoole. The patient has been with pain fairly well-controlled involving the cervical and upper extremity region as well as the lumbar and lower extremity regions with treatment in pain management. At the present time patient is without plans for surgical intervention of the lumbar region. The patient continues medications consisting of oxycodone and Neurontin. The patient denies any undesirable side effects due to the medications. The patient states that the pain involves the lower back lower extremity region which is aggravated by standing walking and becomes more intense as the day progresses. The patient denies any trauma change in events of daily living the call significant change in symptomatology. We will continue present medication regimen at this time and will avoid interventional treatment. All agreed to suggested treatment plan.     Physical examination  There was tenderness to palpation of the paraspinal musculature region of the cervical region cervical facet region palpation which be produced pain of moderate degree with moderate tenderness of the splenius capitis and occipitalis musculature regions. There was well-healed surgical scar of the cervical region without increased warmth and erythema in the region of the scar. Palpation of the thoracic region was with tenderness to palpation and evidence of muscle spasm of moderate degree with palpation of the trapezius levator scapula rhomboid musculature regions reproducing pain of moderate degree with moderate muscle spasm. The patient was able to perform drop test with mild difficulty and Tinel and Phalen's maneuver were without increased pain of  significant degree. Palpation over the lumbar paraspinal musculature region lumbar facet region was of increased pain of moderate degree with lateral bending and rotation extension and palpation of the lumbar facets reproducing moderate discomfort. Straight leg raise was tolerates approximately 30 without a definite increase of pain with dorsiflexion noted. DTRs were difficult to elicit. No definite sensory deficit of dermatomal distribution was noted. EHL strength appeared to be slightly decreased There was negative clonus negative Homans. Abdomen was nontender with no costovertebral tenderness noted.     Assessment  Degenerative disc disease lumbar spine Mild to moderate multilevel degenerative changes with facet arthrosis worse at L5-S1 without significant spinal canal of neural foraminal stenosis mild asymmetric enlargement of the right L5 nerve root which could reflect a radiculitis, L5-S1 mild bulging disc with symptoms small central disc protrusion. L4-5 facet arthrosis. L3-4 disc bulging with facet arthropathy. L2-3 left foraminal/extra foraminal disc protrusion slightly displaces the exiting L2 nerve root. L1-2 shallow left foraminal/extra foraminal disc protrusion  Lumbar stenosis with neurogenic claudication  Lumbar radiculopathy  Lumbar facet syndrome  Degenerative disc disease cervical spine  Status post surgery of the cervical region  Cervical facet syndrome      PLAN   Continue present medications Neurontin and oxycodone .  [facility] today for evaluation of blood pressure and general medical condition as discussed today  F/U surgical evaluation. Neurosurgical reevaluation with Dr. Dutch QuintPoole as discussed  F/U neurological evaluation as discussed  May consider radiofrequency rhizolysis or intraspinal procedures pending response to present treatment and F/U evaluation.  Patient to call Pain Management Center should patient have concerns prior to  scheduled return appointment

## 2015-12-08 ENCOUNTER — Emergency Department: Payer: Medicare Other

## 2015-12-08 ENCOUNTER — Encounter: Payer: Self-pay | Admitting: Emergency Medicine

## 2015-12-08 ENCOUNTER — Emergency Department
Admission: EM | Admit: 2015-12-08 | Discharge: 2015-12-09 | Disposition: A | Discharge status: Home / Self Care | Payer: Medicare Other | Attending: Emergency Medicine | Admitting: Emergency Medicine

## 2015-12-08 ENCOUNTER — Other Ambulatory Visit: Payer: Self-pay

## 2015-12-08 DIAGNOSIS — J45909 Unspecified asthma, uncomplicated: Secondary | ICD-10-CM | POA: Diagnosis not present

## 2015-12-08 DIAGNOSIS — R079 Chest pain, unspecified: Secondary | ICD-10-CM | POA: Diagnosis present

## 2015-12-08 DIAGNOSIS — K2970 Gastritis, unspecified, without bleeding: Secondary | ICD-10-CM | POA: Insufficient documentation

## 2015-12-08 DIAGNOSIS — I10 Essential (primary) hypertension: Secondary | ICD-10-CM | POA: Diagnosis not present

## 2015-12-08 DIAGNOSIS — Z79899 Other long term (current) drug therapy: Secondary | ICD-10-CM | POA: Diagnosis not present

## 2015-12-08 DIAGNOSIS — Z794 Long term (current) use of insulin: Secondary | ICD-10-CM | POA: Diagnosis not present

## 2015-12-08 DIAGNOSIS — E119 Type 2 diabetes mellitus without complications: Secondary | ICD-10-CM | POA: Diagnosis not present

## 2015-12-08 LAB — BASIC METABOLIC PANEL
Anion gap: 13 (ref 5–15)
BUN: 22 mg/dL — ABNORMAL HIGH (ref 6–20)
CHLORIDE: 101 mmol/L (ref 101–111)
CO2: 21 mmol/L — AB (ref 22–32)
CREATININE: 1.49 mg/dL — AB (ref 0.44–1.00)
Calcium: 8.9 mg/dL (ref 8.9–10.3)
GFR calc non Af Amer: 40 mL/min — ABNORMAL LOW (ref >60)
GFR, EST AFRICAN AMERICAN: 47 mL/min — AB (ref >60)
Glucose, Bld: 159 mg/dL — ABNORMAL HIGH (ref 65–99)
Potassium: 2.8 mmol/L — ABNORMAL LOW (ref 3.5–5.1)
Sodium: 135 mmol/L (ref 135–145)

## 2015-12-08 LAB — CBC
HEMATOCRIT: 39.6 % (ref 35.0–47.0)
Hemoglobin: 13.3 g/dL (ref 12.0–16.0)
MCH: 27 pg (ref 26.0–34.0)
MCHC: 33.7 g/dL (ref 32.0–36.0)
MCV: 80.2 fL (ref 80.0–100.0)
PLATELETS: 340 10*3/uL (ref 150–440)
RBC: 4.94 MIL/uL (ref 3.80–5.20)
RDW: 16.5 % — ABNORMAL HIGH (ref 11.5–14.5)
WBC: 22.7 10*3/uL — ABNORMAL HIGH (ref 3.6–11.0)

## 2015-12-08 LAB — TROPONIN I
Troponin I: <0.03 ng/mL (ref <0.03)
Troponin I: <0.03 ng/mL (ref <0.03)

## 2015-12-08 LAB — FIBRIN DERIVATIVES D-DIMER (ARMC ONLY): Fibrin derivatives D-dimer (ARMC): 817 — ABNORMAL HIGH (ref 0–499)

## 2015-12-08 LAB — POCT PREGNANCY, URINE: PREG TEST UR: NEGATIVE

## 2015-12-08 MED ORDER — GI COCKTAIL ~~LOC~~
30.0000 mL | Freq: Once | ORAL | Status: AC
  Administered 2015-12-08: 30 mL via ORAL
  Filled 2015-12-08: qty 30

## 2015-12-08 MED ORDER — IOPAMIDOL (ISOVUE-370) INJECTION 76%
75.0000 mL | Freq: Once | INTRAVENOUS | Status: AC | PRN
  Administered 2015-12-08: 75 mL via INTRAVENOUS

## 2015-12-08 NOTE — ED Notes (Signed)
Charge nurse notified of pt's K+ and WBC

## 2015-12-08 NOTE — ED Provider Notes (Signed)
[facility] Emergency Department Provider Note   ____________________________________________   I have reviewed the triage vital signs and the nursing notes.   HISTORY  Chief Complaint Chest Pain   History limited by: Not Limited   HPI Tiffany Gomez is a 50 y.o. female who presents to the emergency department today because of concerns for chest pain. Is located in the left chest. She describes it as an ache. It started around 3:00. Patient was sitting watching TV when it started. It gradually got worse. At the time of my examination she states that it has gotten somewhat better. It did not radiate into her neck back or arms. It was not associated with chest pain. She did not does any change in the pain with eating or drinking. It did feel somewhat better when she would sit up straight. She denies similar pain in the past. No fevers.    Past Medical History:  Diagnosis Date  . Anxiety    Panic attacks  . Asthma   . Depression   . Diabetes (HCC)   . Essential hypertension   . Hyperlipidemia   . Leg pain   . Morbid obesity (HCC)   . Renal insufficiency   . Sleep apnea     Patient Active Problem List   Diagnosis Date Noted  . Diarrhea 08/30/2015  . Acute-on-chronic kidney injury (HCC) 08/30/2015  . OSA on CPAP 08/30/2015  . Diabetes mellitus (HCC) 08/30/2015  . Dyslipidemia 08/30/2015  . Anxiety and depression 08/30/2015  . Rhabdomyolysis 07/29/2015  . Acute renal failure (HCC)   . Hyperkalemia   . Arterial hypotension   . Respiratory failure (HCC)   . f 09/02/2014  . Lumbar radiculitis 09/02/2014  . Lumbar radiculopathy, acute 09/02/2014  . Facet syndrome, lumbar 09/02/2014  . DDD (degenerative disc disease), cervical 09/01/2014  . HNP (herniated nucleus pulposus) with myelopathy, cervical 06/16/2014  . Morbid obesity (HCC) 06/16/2014  . Cervical herniated disc 06/16/2014  . Pain in the chest 06/01/2014  . Essential hypertension    . Hyperlipidemia     Past Surgical History:  Procedure Laterality Date  . ANTERIOR CERVICAL DECOMP/DISCECTOMY FUSION N/A 06/16/2014   Procedure: ANTERIOR CERVICAL DECOMPRESSION/DISCECTOMY FUSION CERVICAL FIVE-SIX;  Surgeon: Julio Sicks, MD;  Location: [facility];  Service: Neurosurgery;  Laterality: N/A;  . CARDIAC SURGERY    . GASTRIC BYPASS    . LAPAROSCOPIC CHOLECYSTECTOMY      Prior to Admission medications   Medication Sig Start Date End Date Taking? Authorizing Provider  albuterol (PROVENTIL HFA;VENTOLIN HFA) 108 (90 BASE) MCG/ACT inhaler Inhale 2 puffs into the lungs every 6 (six) hours as needed for wheezing or shortness of breath.    Historical Provider, MD  alprazolam Prudy Feeler) 2 MG tablet Take 2 mg by mouth See admin instructions. Take 1 tablet by mouth 3 to 4 times a day as needed anxiety. 08/05/15   Historical Provider, MD  clonazePAM (KLONOPIN) 1 MG tablet Take 1 tablet (1 mg total) by mouth at bedtime. 08/05/15   Adrian Saran, MD  gabapentin (NEURONTIN) 100 MG capsule Limit 1 tablet by mouth per day or 2 - 4  times per day if tolerated    NEURONTIN IS NOW 100 MG SIZE    CAUTION  TO NOT TAKE 300 MG NEURONTIN 12/01/15   Ewing Schlein, MD  glipiZIDE (GLUCOTROL XL) 5 MG 24 hr tablet Take 5 mg by mouth daily. 08/20/15   Historical Provider, MD  hydrochlorothiazide (MICROZIDE) 12.5 MG capsule Take  12.5 mg by mouth daily. 08/20/15   Historical Provider, MD  insulin aspart (NOVOLOG) 100 UNIT/ML injection Inject 0-15 Units into the skin 3 (three) times daily with meals. Patient not taking: Reported on 10/26/2015 08/05/15   Adrian SaranSital Mody, MD  linagliptin (TRADJENTA) 5 MG TABS tablet Take 5 mg by mouth daily.    Historical Provider, MD  loperamide (IMODIUM) 2 MG capsule Take 1 capsule (2 mg total) by mouth every 8 (eight) hours as needed for diarrhea or loose stools. Patient not taking: Reported on 12/01/2015 09/02/15   Enedina FinnerSona Patel, MD  megestrol (MEGACE) 40 MG tablet Take 40 mg by mouth daily.      Historical Provider, MD  metoprolol tartrate (LOPRESSOR) 25 MG tablet Take 1 tablet (25 mg total) by mouth 2 (two) times daily. Patient not taking: Reported on 12/01/2015 08/05/15   Adrian SaranSital Mody, MD  montelukast (SINGULAIR) 10 MG tablet Take 10 mg by mouth every morning.     Historical Provider, MD  Oxycodone HCl 10 MG TABS Limit 1 tablet by mouth per day or 2-4 times per day if tolerated 12/01/15   Ewing SchleinGregory Crisp, MD  pravastatin (PRAVACHOL) 20 MG tablet Take 20 mg by mouth at bedtime.    Historical Provider, MD  torsemide (DEMADEX) 10 MG tablet Take 10 mg by mouth daily. 08/19/15   Historical Provider, MD  traMADol (ULTRAM) 50 MG tablet Take 1 tablet (50 mg total) by mouth every 6 (six) hours as needed. Patient not taking: Reported on 09/03/2015 08/23/15 08/22/16  Emily FilbertJonathan E Williams, MD  traMADol (ULTRAM) 50 MG tablet Take 1 tablet (50 mg total) by mouth every 6 (six) hours as needed. Patient not taking: Reported on 09/03/2015 08/24/15   Rebecka ApleyAllison P Webster, MD  venlafaxine [facility](EFFEXOR) 75 MG tablet Take 75 mg by mouth 2 (two) times daily with a meal.     Historical Provider, MD  zaleplon (SONATA) 10 MG capsule Take 10 mg by mouth at bedtime as needed. For sleep. 07/30/15   Historical Provider, MD  ziprasidone (GEODON) 80 MG capsule Take 160 mg by mouth at bedtime.     Historical Provider, MD    Allergies Review of patient's allergies indicates no known allergies.  Family History  Problem Relation Age of Onset  . Heart failure Mother     Social History Social History  Substance Use Topics  . Smoking status: Never Smoker  . Smokeless tobacco: Never Used  . Alcohol use No    Review of Systems  Constitutional: Negative for fever. Cardiovascular: Positive for chest pain. Respiratory: Negative for shortness of breath. Gastrointestinal: Negative for abdominal pain, vomiting and diarrhea. Neurological: Negative for headaches, focal weakness or numbness.  10-point ROS otherwise  negative.  ____________________________________________   PHYSICAL EXAM:  VITAL SIGNS: ED Triage Vitals  Enc Vitals Group     BP 12/08/15 1829 (!) 141/83     Pulse Rate 12/08/15 1829 (!) 107     Resp 12/08/15 1829 16     Temp 12/08/15 1829 97.8 F (36.6 C)     Temp Source 12/08/15 1829 Oral     SpO2 12/08/15 1829 97 %     Weight 12/08/15 1830 (!) 365 lb (165.6 kg)     Height 12/08/15 1830 5\' 6"  (1.676 m)     Head Circumference --      Peak Flow --      Pain Score 12/08/15 1842 8   Constitutional: Alert and oriented. Well appearing and in no distress. Eyes: Conjunctivae are  normal. Normal extraocular movements. ENT   Head: Normocephalic and atraumatic.   Nose: No congestion/rhinnorhea.   Mouth/Throat: Mucous membranes are moist.   Neck: No stridor. Hematological/Lymphatic/Immunilogical: No cervical lymphadenopathy. Cardiovascular: Normal rate, regular rhythm.  No murmurs, rubs, or gallops. Respiratory: Normal respiratory effort without tachypnea nor retractions. Breath sounds are clear and equal bilaterally. No wheezes/rales/rhonchi. Gastrointestinal: Soft and nontender. No distention.  Genitourinary: Deferred Musculoskeletal: Normal range of motion in all extremities. No lower extremity edema. Neurologic:  Normal speech and language. No gross focal neurologic deficits are appreciated.  Skin:  Skin is warm, dry and intact. No rash noted. Psychiatric: Mood and affect are normal. Speech and behavior are normal. Patient exhibits appropriate insight and judgment.  ____________________________________________    LABS (pertinent positives/negatives)  Labs Reviewed  BASIC METABOLIC PANEL - Abnormal; Notable for the following:       Result Value   Potassium 2.8 (*)    CO2 21 (*)    Glucose, Bld 159 (*)    BUN 22 (*)    Creatinine, Ser 1.49 (*)    GFR calc non Af Amer 40 (*)    GFR calc Af Amer 47 (*)    All other components within normal limits  CBC -  Abnormal; Notable for the following:    WBC 22.7 (*)    RDW 16.5 (*)    All other components within normal limits  FIBRIN DERIVATIVES D-DIMER ([facility] ONLY) - Abnormal; Notable for the following:    Fibrin derivatives D-dimer ([facility]) 817 (*)    All other components within normal limits  TROPONIN I  TROPONIN I  POC URINE PREG, ED  POCT PREGNANCY, URINE    ____________________________________________   EKG  I, Phineas Semen, attending physician, personally viewed and interpreted this EKG  EKG Time: 1826 Rate: 102 Rhythm: sinus tachycardia Axis: normal Intervals: qtc 583 QRS: narrow ST changes: no st elevation Impression: abnormal ekg   ____________________________________________    RADIOLOGY  CXR IMPRESSION:  No active cardiopulmonary disease.    CT angio IMPRESSION:  1. No evidence of pulmonary embolus.  2. Lungs clear bilaterally.      ____________________________________________   PROCEDURES  Procedures  ____________________________________________   INITIAL IMPRESSION / ASSESSMENT AND PLAN / ED COURSE  Pertinent labs & imaging results that were available during my care of the patient were reviewed by me and considered in my medical decision making (see chart for details).  Patient presented to the emergency department today because of concerns for left chest pain. Workup was concerning for an elevated d-dimer and tachycardia so CT angiogram was ordered. This did not show any evidence of pulmonary embolism. Additionally no pneumonia or pneumothorax. 2 sets of troponin negative. Patient did feel some relief with GI cocktail. This point think gastritis possible. Will discharge with an antiacid and sucralfate.   ____________________________________________   FINAL CLINICAL IMPRESSION(S) / ED DIAGNOSES  Final diagnoses:  Chest pain, unspecified chest pain type  Gastritis     Note: This dictation was prepared with Dragon dictation. Any  transcriptional errors that result from this process are unintentional    Phineas Semen, MD 12/09/15 0006

## 2015-12-08 NOTE — ED Notes (Signed)
2 unsuccessful PIV attempts by this RN. 

## 2015-12-08 NOTE — ED Notes (Signed)
Pt reports chest pains since 3pm today - reports that pain feels "like something is sitting there" - reports she has never had this pain before - denies nausea/vomiting - denies dizziness - denies shortness of breath

## 2015-12-08 NOTE — ED Triage Notes (Signed)
Pt to ED from home c/o chest pain that started today around 1500.  Pt states sitting when pain started to left chest as heaviness that is constant, non-radiating, SOB with it.  Pt states hx of HTN, high cholesterol, and DBM.  Pt A&Ox4, speaking in complete and coherent sentences and in NAD at this time.

## 2015-12-09 MED ORDER — RANITIDINE HCL 150 MG PO TABS: 150.0000 mg | ORAL_TABLET | Freq: Two times a day (BID) | ORAL | 1 refills | Status: AC

## 2015-12-09 MED ORDER — SUCRALFATE 1 G PO TABS: 1.0000 g | ORAL_TABLET | Freq: Four times a day (QID) | ORAL | 0 refills | Status: AC

## 2015-12-09 NOTE — Discharge Instructions (Signed)
Please seek medical attention for any high fevers, chest pain, shortness of breath, change in behavior, persistent vomiting, bloody stool or any other new or concerning symptoms.  

## 2015-12-10 ENCOUNTER — Telehealth: Payer: Self-pay | Admitting: *Deleted

## 2015-12-22 ENCOUNTER — Ambulatory Visit: Payer: Medicare Other | Admitting: Cardiovascular Disease

## 2015-12-23 ENCOUNTER — Other Ambulatory Visit: Payer: Self-pay | Admitting: Pain Medicine

## 2015-12-27 ENCOUNTER — Other Ambulatory Visit: Payer: Self-pay | Admitting: Pain Medicine

## 2015-12-29 ENCOUNTER — Ambulatory Visit: Payer: Medicare Other | Admitting: Pain Medicine

## 2015-12-29 ENCOUNTER — Other Ambulatory Visit: Payer: Self-pay | Admitting: Pain Medicine

## 2016-02-03 DEATH — deceased

## 2016-11-21 IMAGING — CR DG CHEST 2V
1 series · 2 of 2 positions shown · non-contrast
Comparison: Chest radiograph April 28, 2011

CLINICAL DATA: Shortness of breath, history of asthma.  Toothache.

EXAM:
CHEST  2 VIEW

[Series 1: dxr chest pa (or ap) and lateral · 0.14mm/px · 2 of 2 slices shown]
[im 1/2]
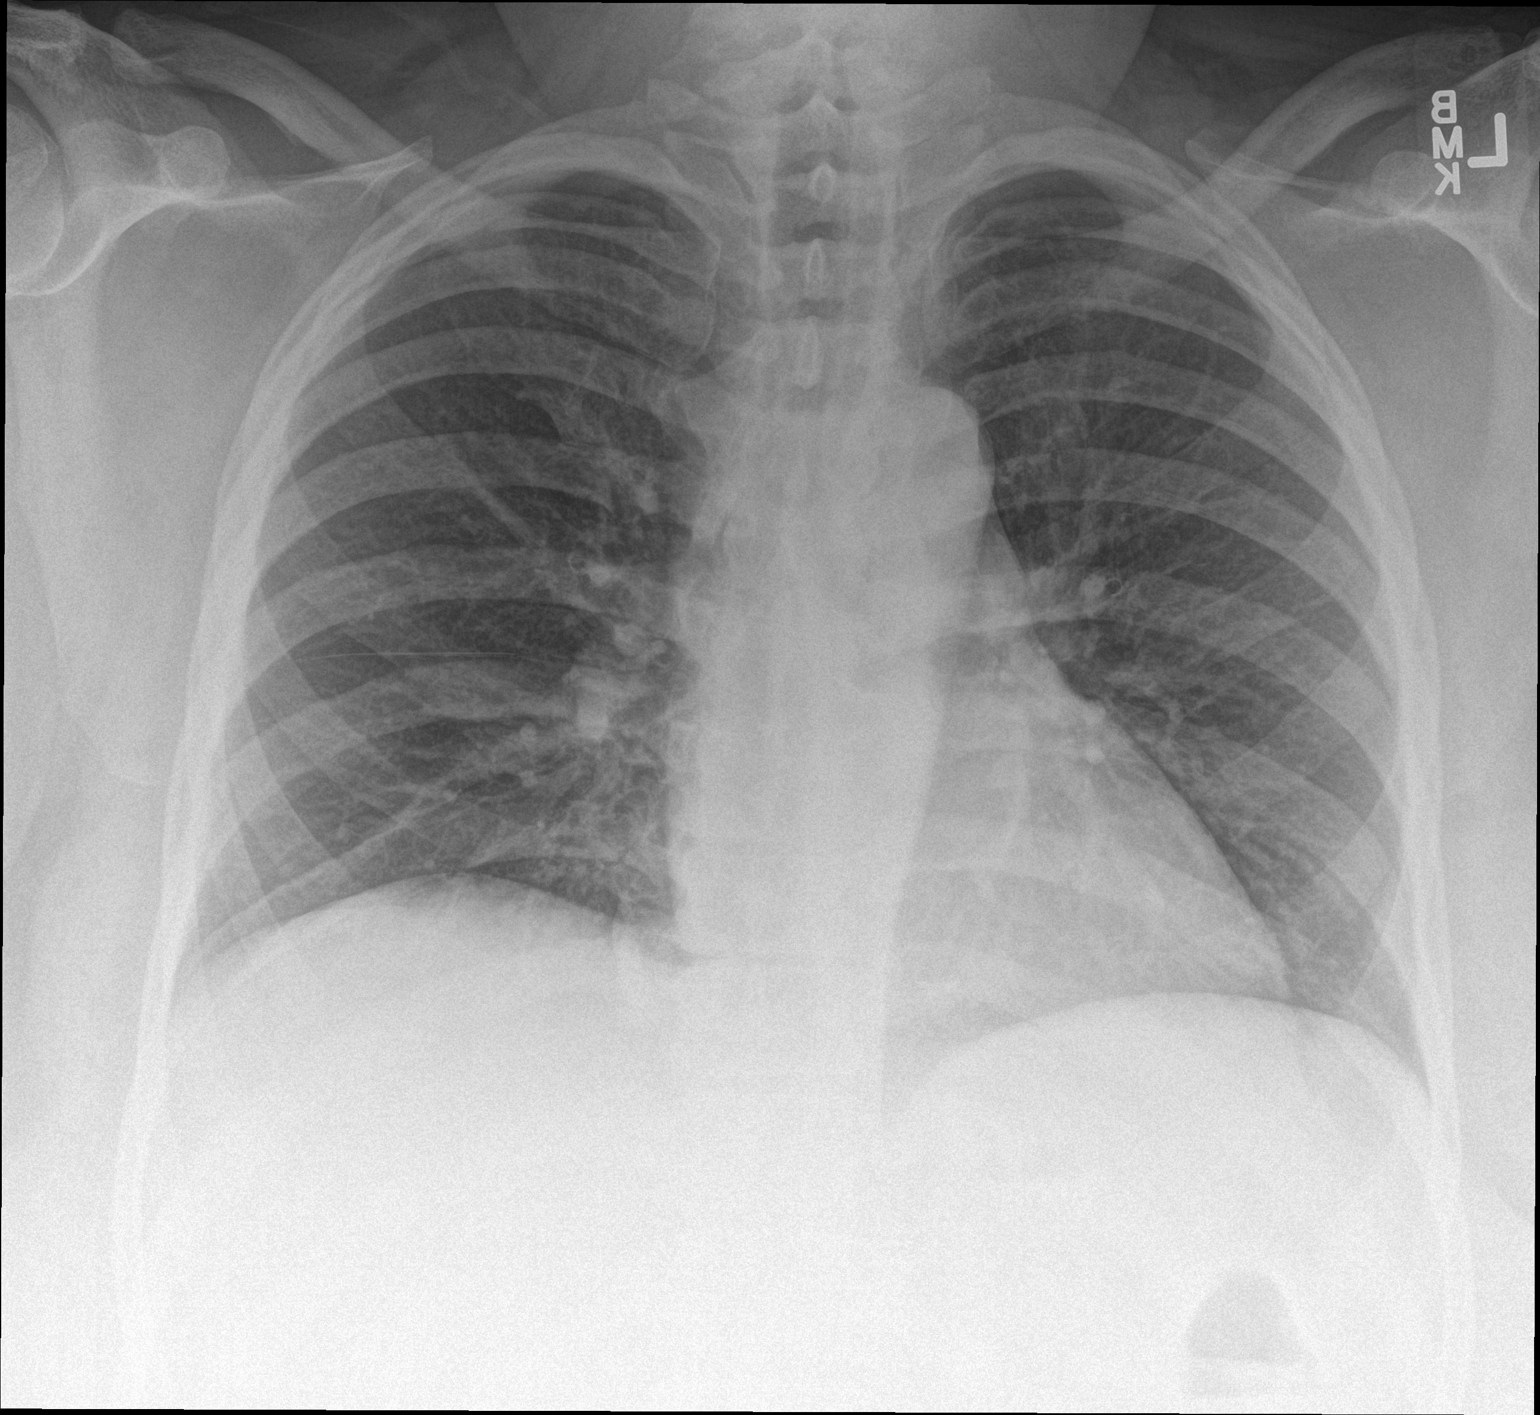
[im 2/2]
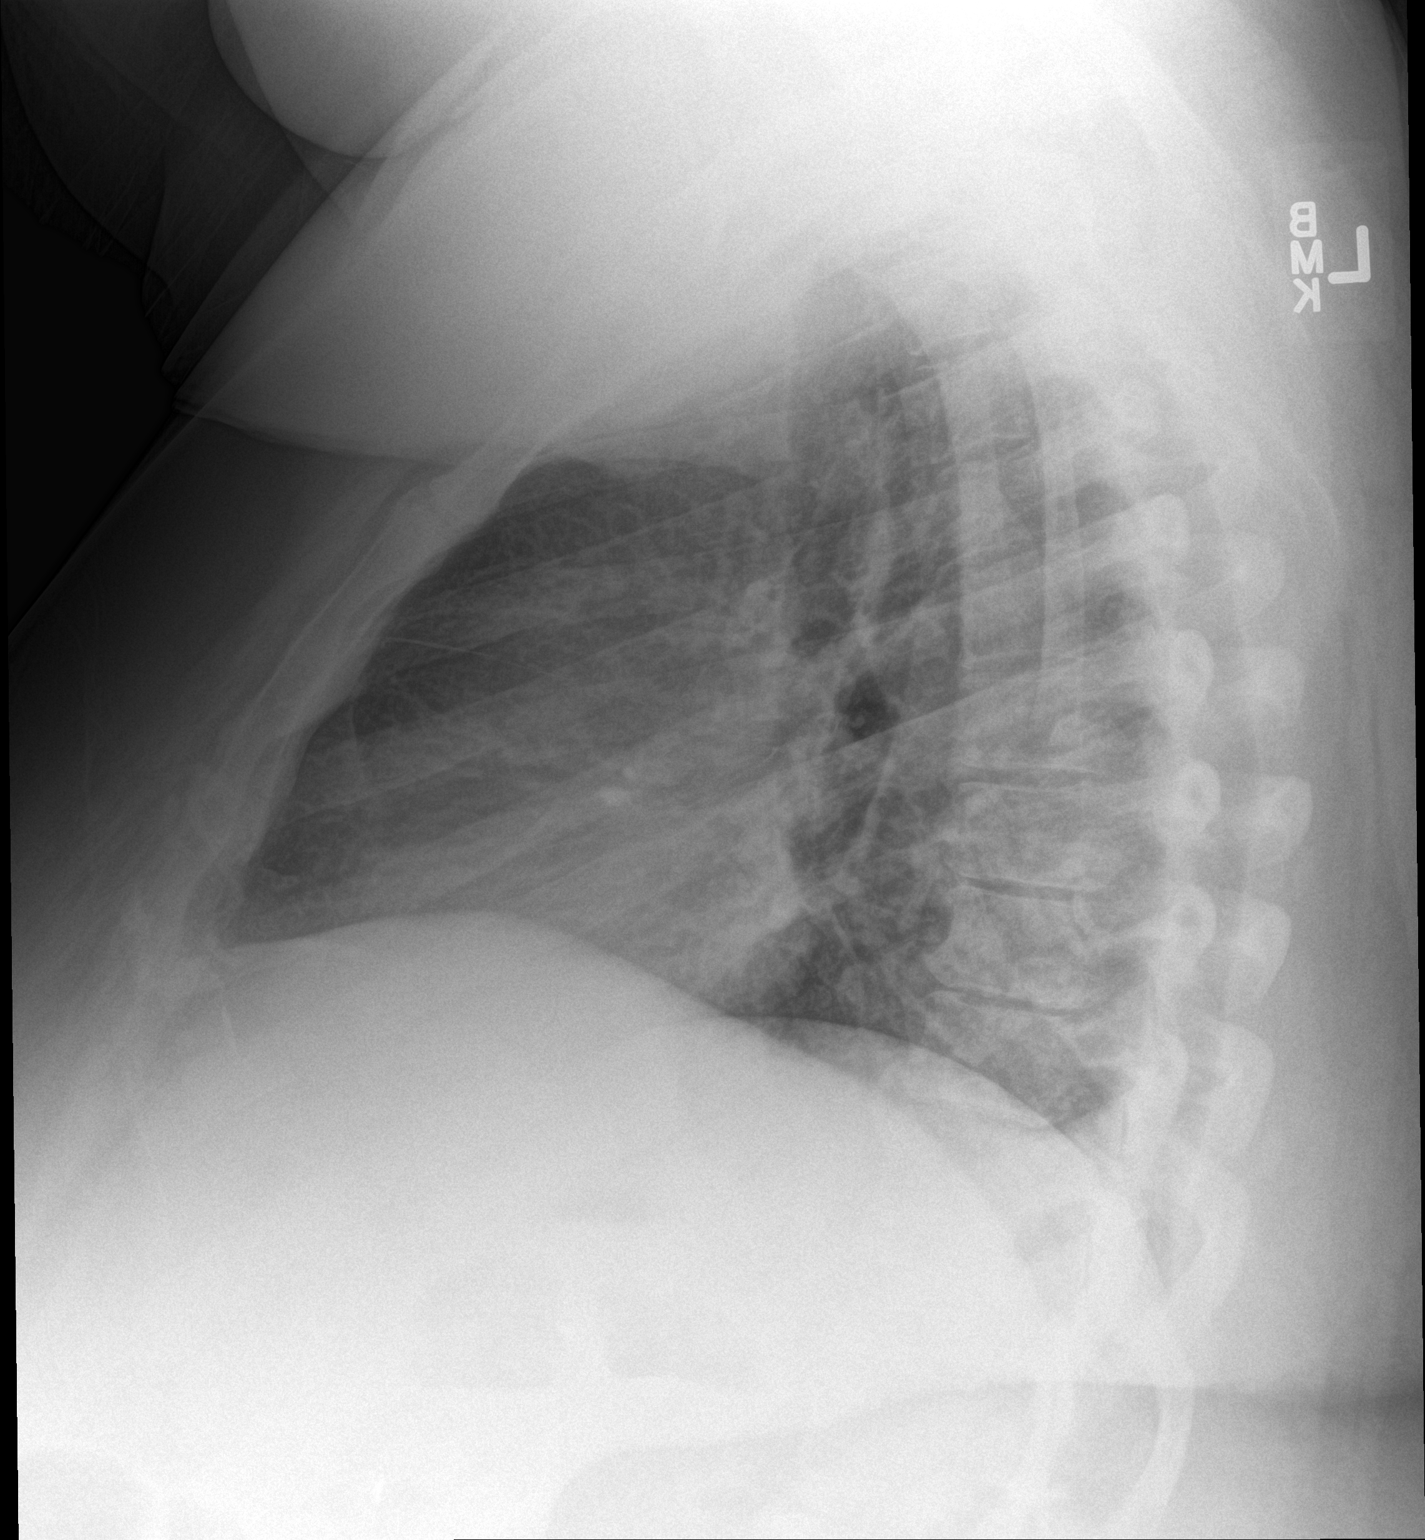

[2 of 2 positions shown; findings below may reference images not displayed]

FINDINGS: Cardiomediastinal silhouette is unremarkable. Mild bronchitic
changes. Low inspiratory examination. The lungs are clear without
pleural effusions or focal consolidations. Trachea projects midline
and there is no pneumothorax. Soft tissue planes and included
osseous structures are non-suspicious. Large body habitus.
IMPRESSION: Mild bronchitic changes without focal consolidation in this habitus
limited examination.

  By: Perionica Momak

## 2018-02-11 IMAGING — DX DG ABD PORTABLE 1V
2 series · 2 of 2 positions shown · non-contrast
Comparison: CT of the abdomen and pelvis from 08/03/2011

CLINICAL DATA: Orogastric tube placement.  Initial encounter.

EXAM:
PORTABLE ABDOMEN - 1 VIEW

[abdomen kub (1 of 2)]
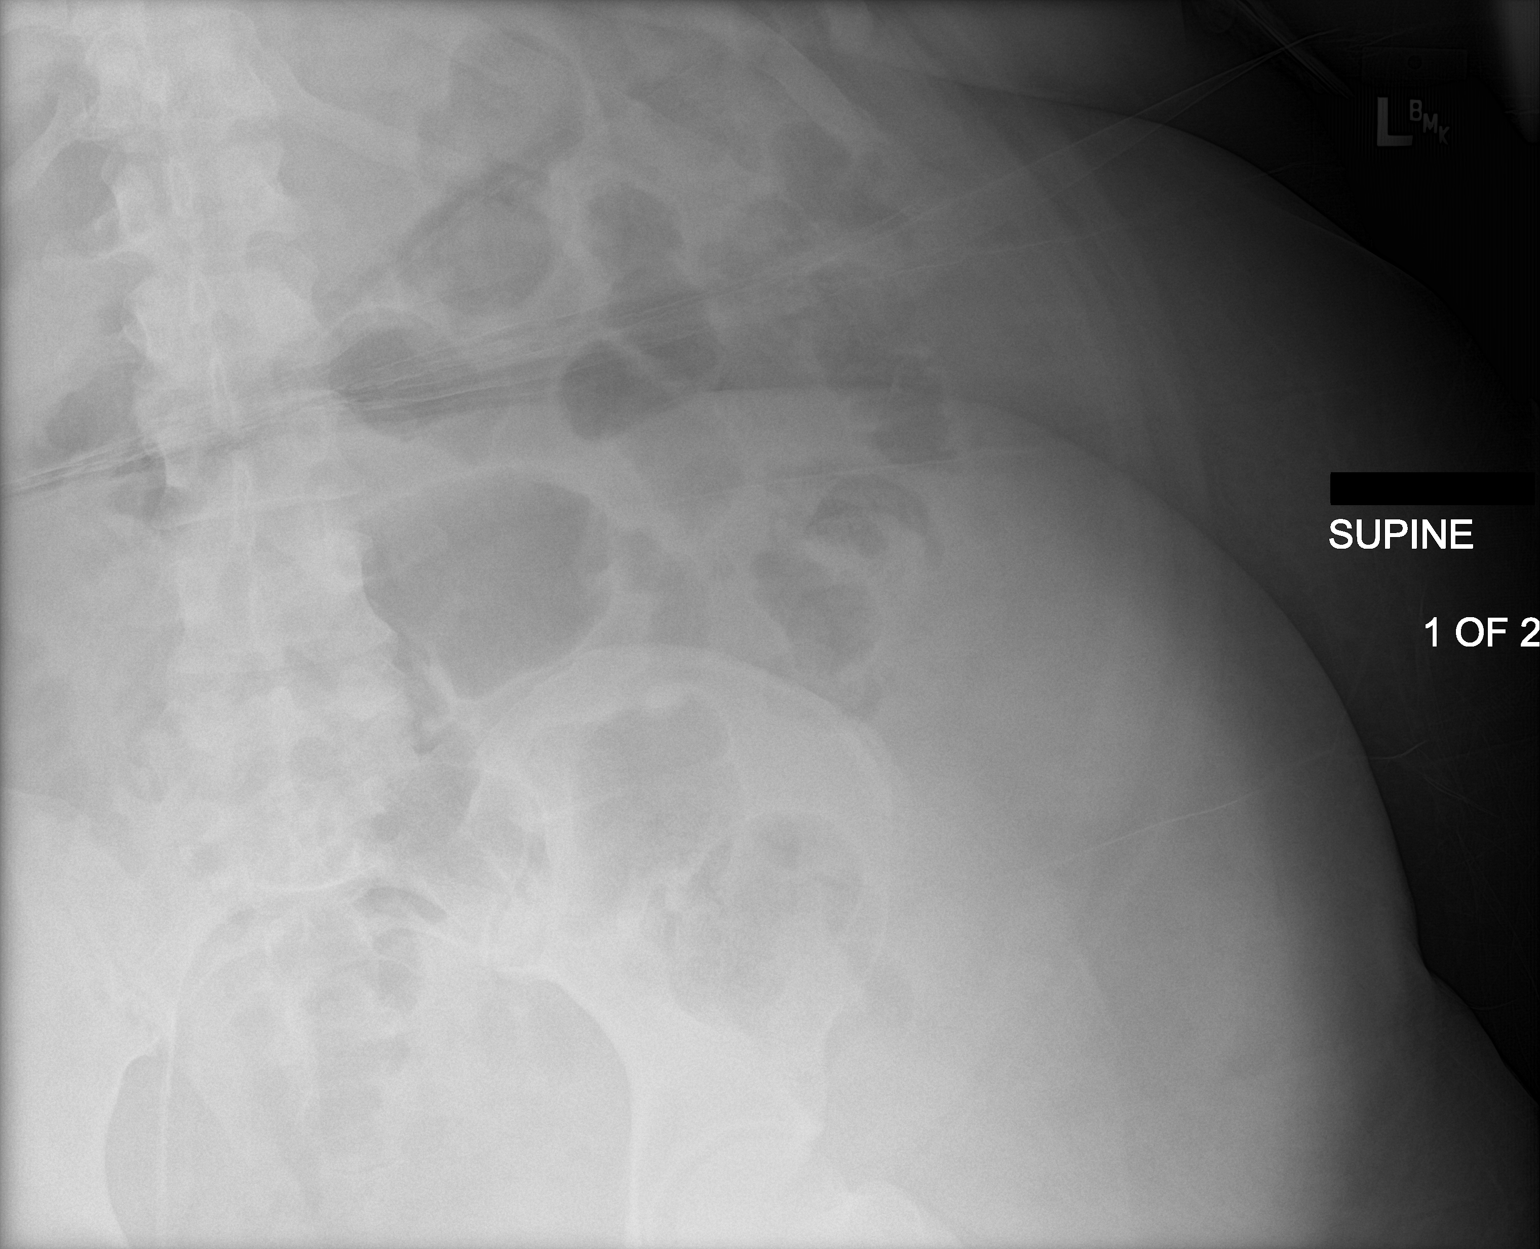

[abdomen kub (2 of 2)]
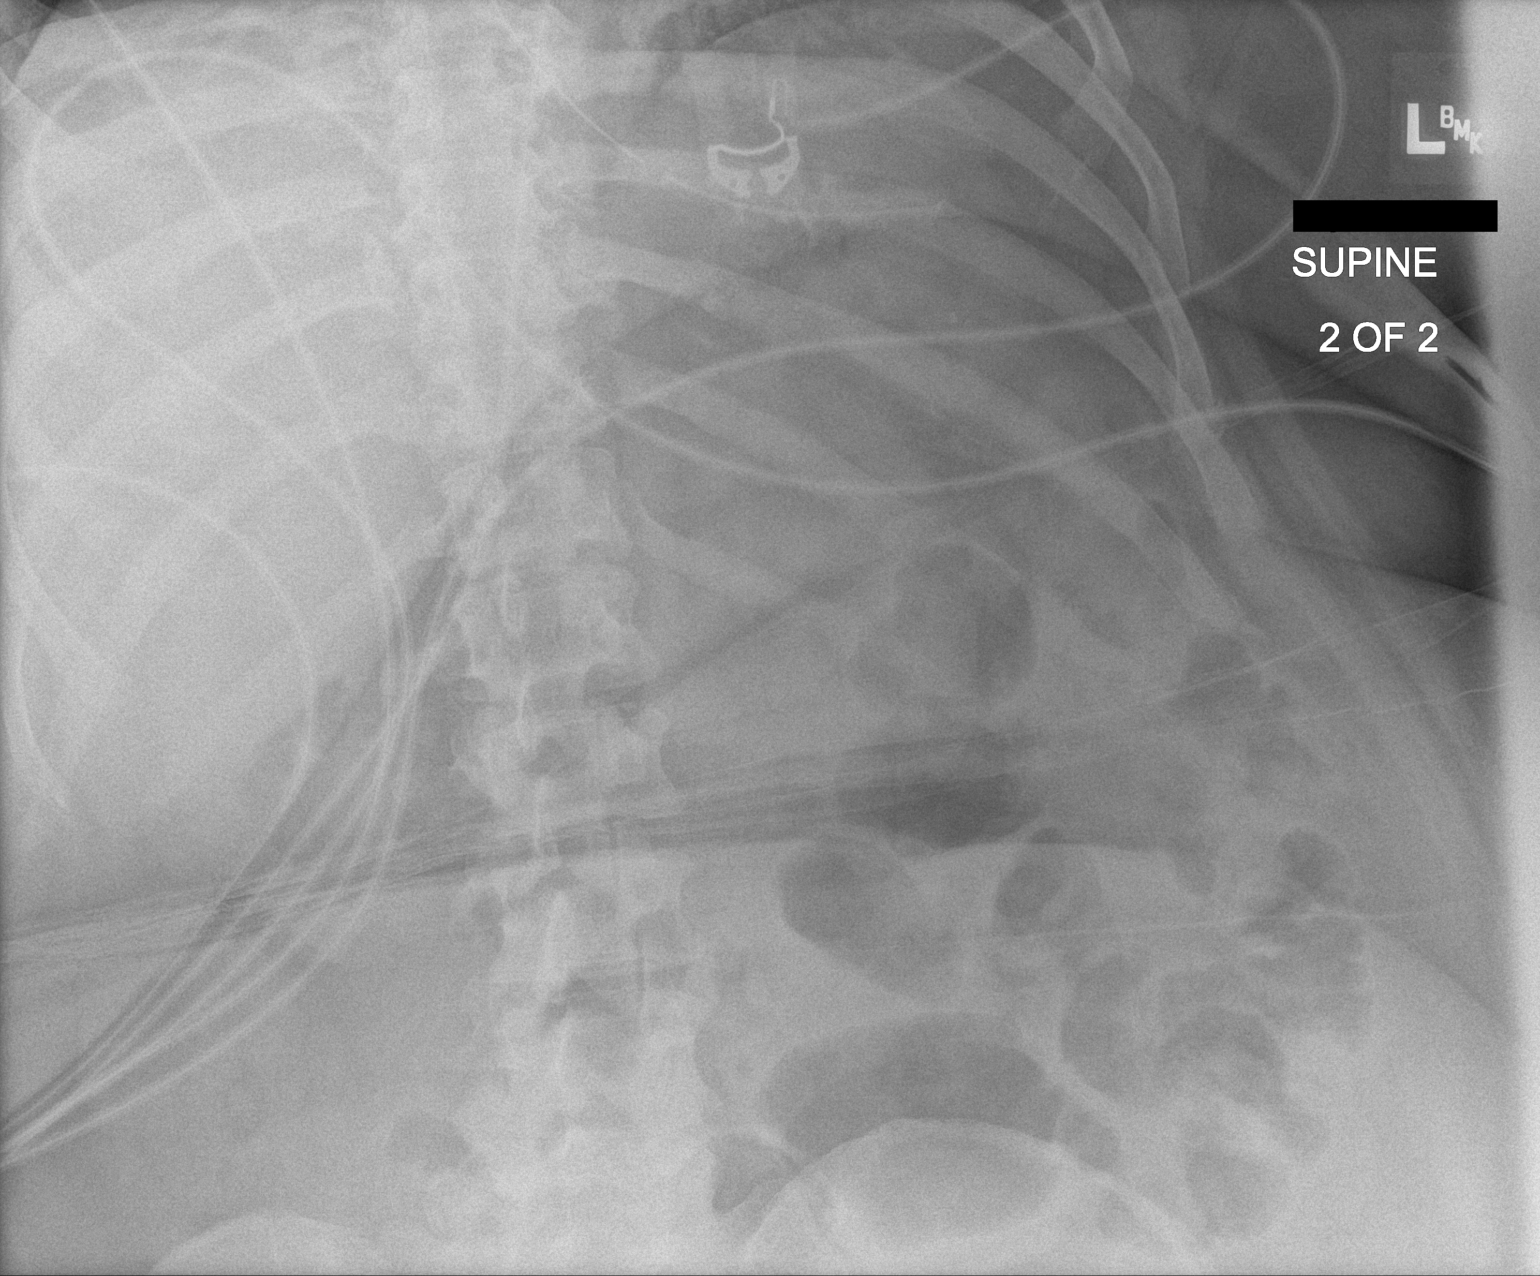

[2 of 2 positions shown; findings below may reference images not displayed]

FINDINGS: The patient's enteric tube is noted ending overlying the body of the
stomach, with the side port about the gastroesophageal junction.

The visualized bowel gas pattern is grossly unremarkable. No free
intra-abdominal air is seen, though evaluation for free air is
limited on supine views. No acute osseous abnormalities are
identified.
IMPRESSION: Enteric tube noted ending overlying the body of the stomach, with
the side port about the gastroesophageal junction.

## 2018-02-11 IMAGING — DX DG CHEST 1V PORT
1 series · 1 of 1 positions shown · non-contrast
Comparison: Chest radiograph performed 05/30/2014

CLINICAL DATA: Acute onset of disorientation and lethargy. Vomiting
and diarrhea. Initial encounter.

EXAM:
PORTABLE CHEST 1 VIEW

[chest ap]
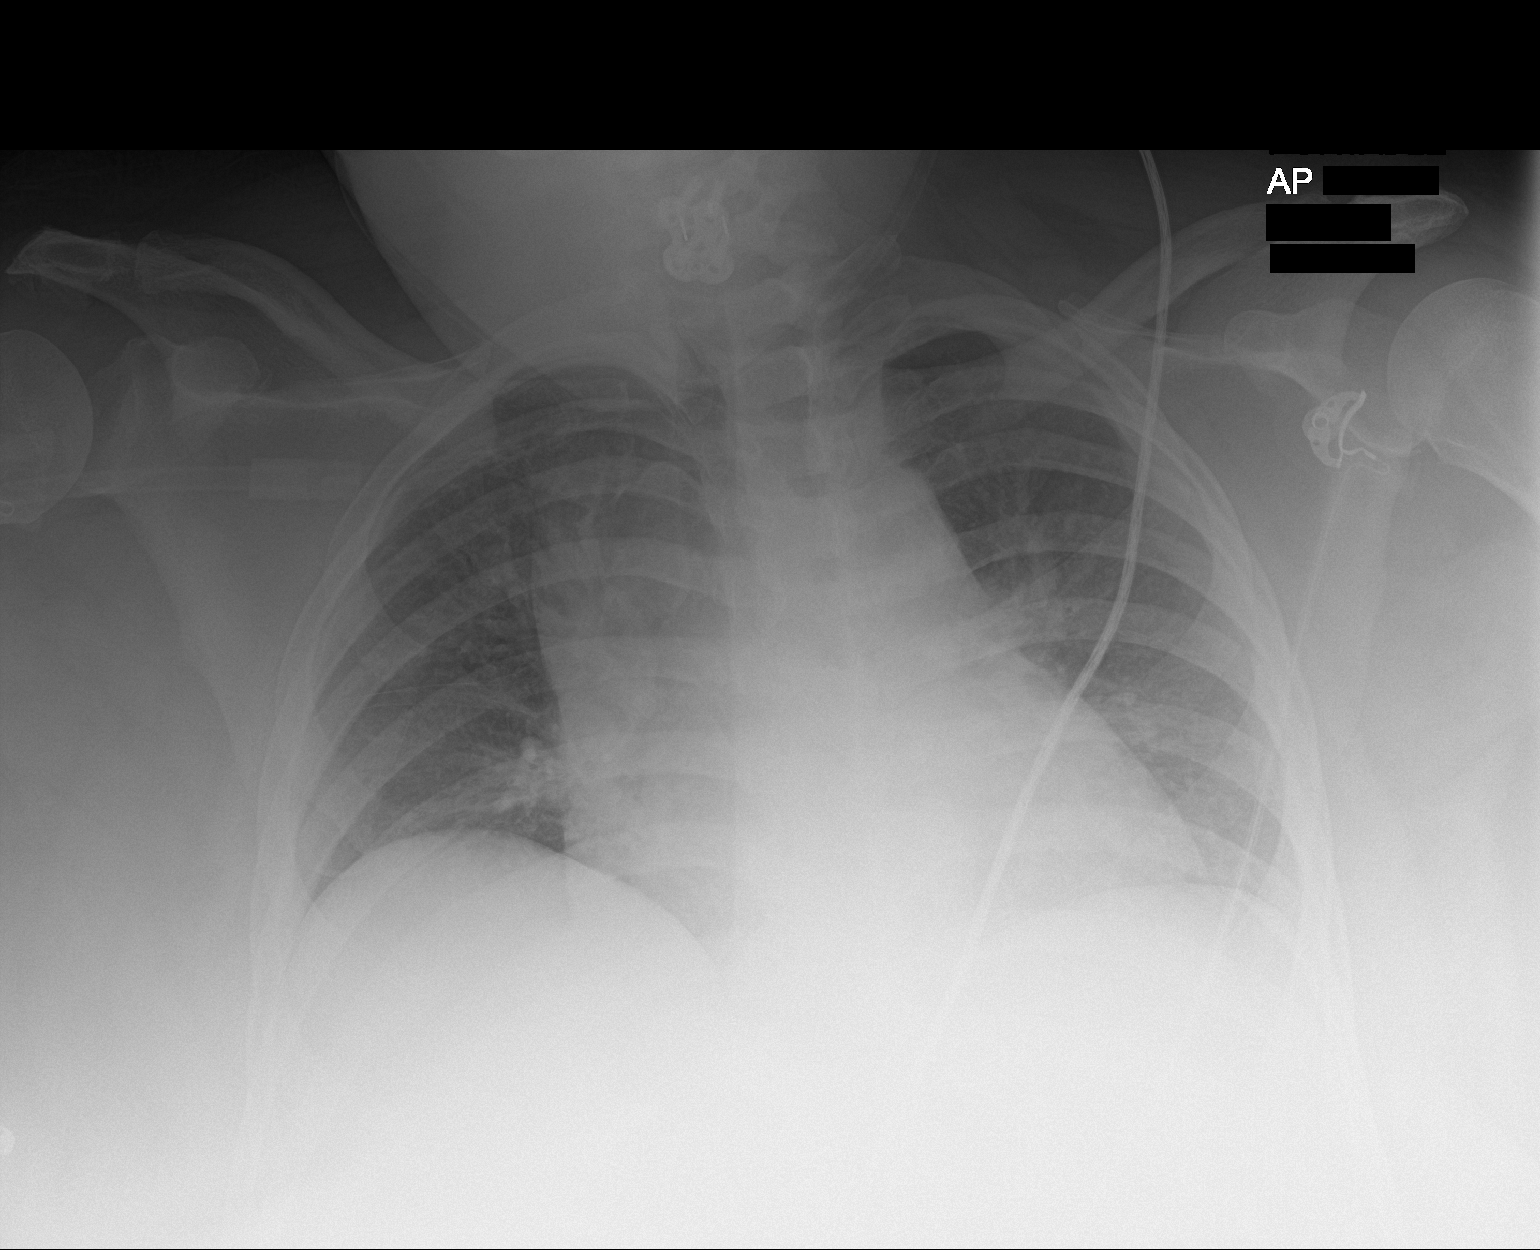

[1 of 1 positions shown; findings below may reference images not displayed]

FINDINGS: The lungs are hypoexpanded. Mild vascular crowding and vascular
congestion are seen. There is no evidence of focal opacification,
pleural effusion or pneumothorax.

The cardiomediastinal silhouette is borderline enlarged. No acute
osseous abnormalities are seen.
IMPRESSION: Mild vascular congestion and borderline cardiomegaly. Lungs remain
grossly clear.

## 2018-02-12 IMAGING — DX DG CHEST 1V PORT
1 series · 1 of 1 positions shown · non-contrast
Comparison: Yesterday

CLINICAL DATA: Respiratory failure

EXAM:
PORTABLE CHEST 1 VIEW

[chest ap]
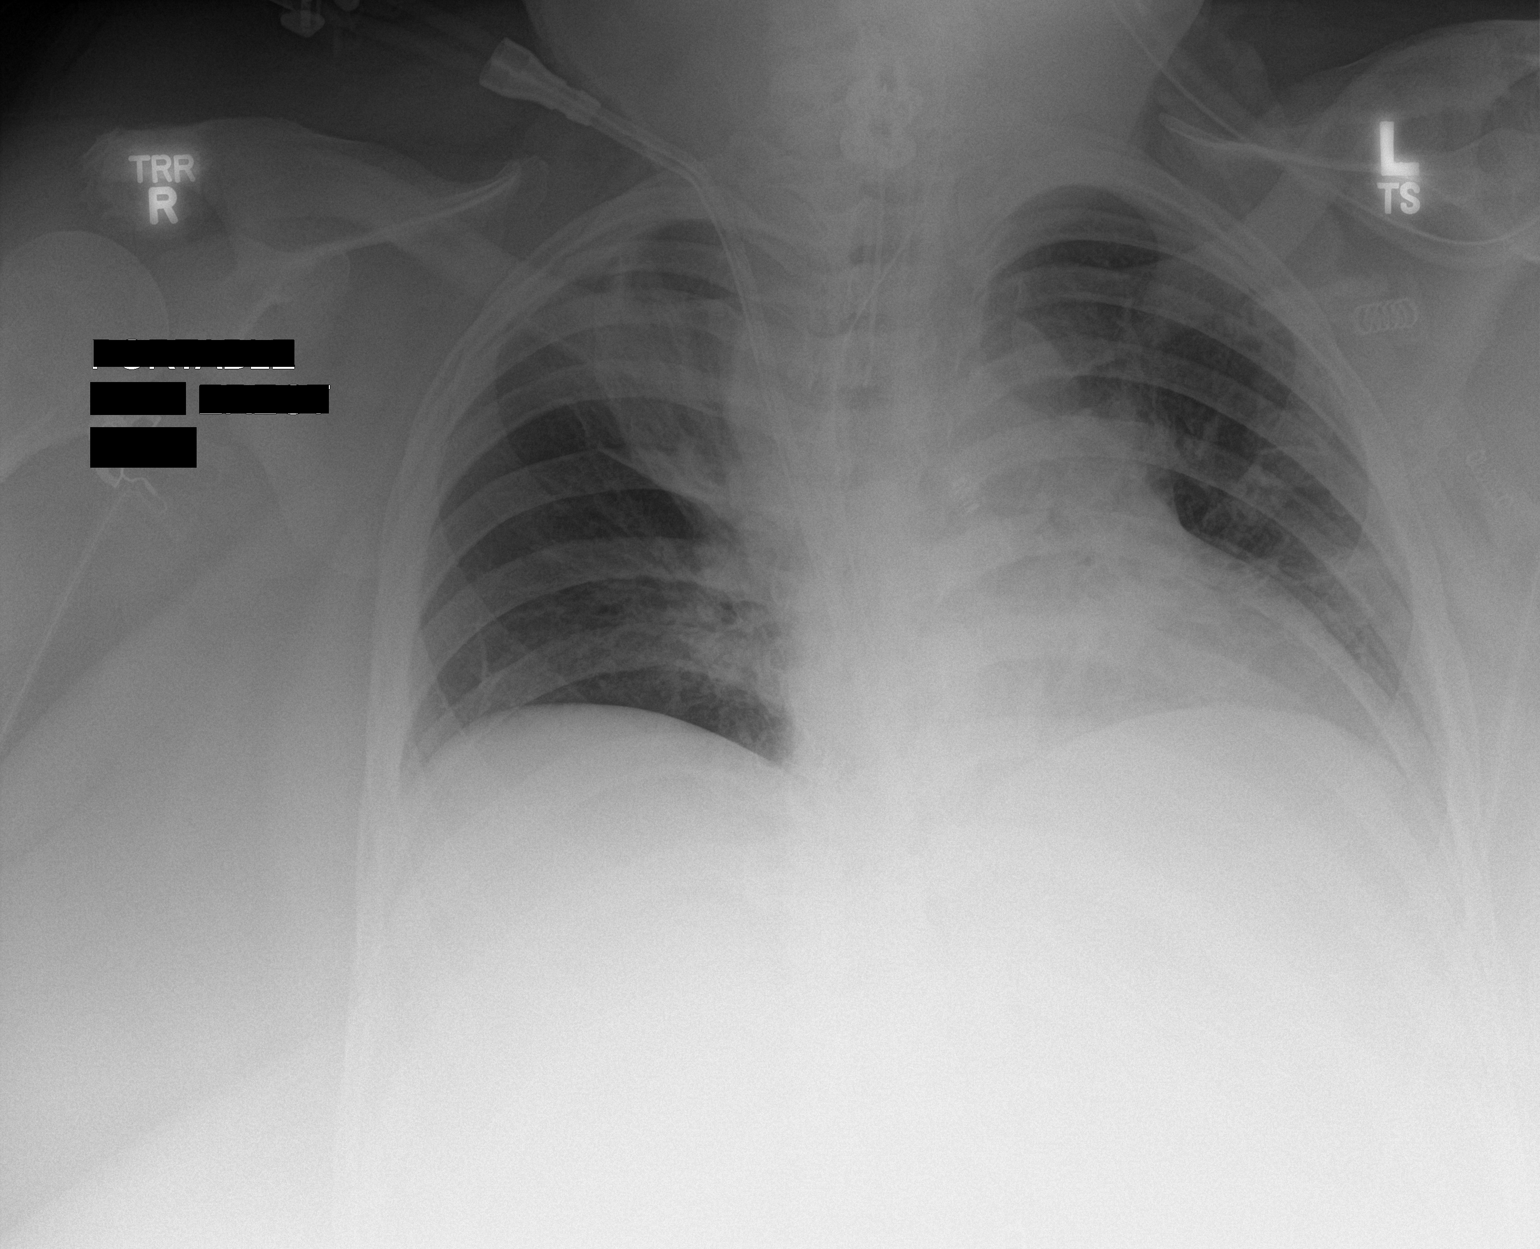

[1 of 1 positions shown; findings below may reference images not displayed]

FINDINGS: Endotracheal tube tip remains at the clavicular heads. An orogastric
tube reaches the stomach. Right-sided dialysis catheter with tip at
the upper atrium.

Perihilar atelectasis, increased in the right upper lobe. Lung
volumes are low. Chronic cardiomegaly. No edema, effusion, or air
leak.
IMPRESSION: 1. Unchanged positioning of tubes and dialysis catheter.
2. Low lung volumes with progressive atelectasis.
